# Patient Record
Sex: Male | Born: 1950 | Race: White | Hispanic: Yes | Marital: Single | Smoking: Never smoker
Health system: Southern US, Community
[De-identification: ages and names within clinical notes are randomized; demographics above are authoritative.]

## PROBLEM LIST (undated history)

## (undated) DIAGNOSIS — L821 Other seborrheic keratosis: Secondary | ICD-10-CM

## (undated) DIAGNOSIS — I10 Essential (primary) hypertension: Secondary | ICD-10-CM

## (undated) DIAGNOSIS — R600 Localized edema: Secondary | ICD-10-CM

## (undated) DIAGNOSIS — R3915 Urgency of urination: Secondary | ICD-10-CM

## (undated) DIAGNOSIS — R319 Hematuria, unspecified: Secondary | ICD-10-CM

## (undated) DIAGNOSIS — N201 Calculus of ureter: Secondary | ICD-10-CM

## (undated) DIAGNOSIS — Z87442 Personal history of urinary calculi: Secondary | ICD-10-CM

## (undated) DIAGNOSIS — I639 Cerebral infarction, unspecified: Secondary | ICD-10-CM

## (undated) DIAGNOSIS — E785 Hyperlipidemia, unspecified: Secondary | ICD-10-CM

## (undated) DIAGNOSIS — R350 Frequency of micturition: Secondary | ICD-10-CM

## (undated) DIAGNOSIS — N189 Chronic kidney disease, unspecified: Secondary | ICD-10-CM

## (undated) DIAGNOSIS — J302 Other seasonal allergic rhinitis: Secondary | ICD-10-CM

## (undated) DIAGNOSIS — R1310 Dysphagia, unspecified: Secondary | ICD-10-CM

## (undated) DIAGNOSIS — E119 Type 2 diabetes mellitus without complications: Secondary | ICD-10-CM

## (undated) DIAGNOSIS — E1169 Type 2 diabetes mellitus with other specified complication: Secondary | ICD-10-CM

## (undated) HISTORY — DX: Essential (primary) hypertension: I10

## (undated) HISTORY — DX: Hyperlipidemia, unspecified: E78.5

## (undated) HISTORY — DX: Type 2 diabetes mellitus with other specified complication: E11.69

## (undated) HISTORY — PX: CIRCUMCISION: SUR203

## (undated) HISTORY — DX: Dysphagia, unspecified: R13.10

---
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
## 2004-09-13 ENCOUNTER — Emergency Department (HOSPITAL_COMMUNITY): Admission: EM | Admit: 2004-09-13 | Discharge: 2004-09-14 | Payer: Self-pay | Admitting: Emergency Medicine

## 2006-11-27 ENCOUNTER — Emergency Department (HOSPITAL_COMMUNITY): Admission: EM | Admit: 2006-11-27 | Discharge: 2006-11-28 | Payer: Self-pay | Admitting: Emergency Medicine

## 2008-06-27 DIAGNOSIS — I2510 Atherosclerotic heart disease of native coronary artery without angina pectoris: Secondary | ICD-10-CM | POA: Insufficient documentation

## 2012-11-09 ENCOUNTER — Ambulatory Visit (INDEPENDENT_AMBULATORY_CARE_PROVIDER_SITE_OTHER): Payer: 59 | Admitting: Sports Medicine

## 2012-11-09 VITALS — BP 152/81 | Ht 68.0 in | Wt 187.0 lb

## 2012-11-09 DIAGNOSIS — M545 Low back pain, unspecified: Secondary | ICD-10-CM

## 2012-11-09 MED ORDER — PREDNISONE 10 MG PO KIT: PACK | ORAL | Status: DC

## 2012-11-09 NOTE — Progress Notes (Signed)
  Subjective:    Patient ID: Daniel Holland, male    DOB: 1951/02/14, 62 y.o.   MRN: [id number]  HPI chief complaint: Back pain  62 year old male comes in today with 2 days of low back pain. He had a sudden onset of pain that began after he was carrying a couple of cases of Gatorade. Sharp pain that is mainly on the right side of his low-back but pain initially radiated up his entire spine. He denies radiating pain into his legs. No associated numbness or tingling. He has a history of a lumbar disc herniation which was treated conservatively a couple of years ago. He has not had any problems with his back since. He works at [employer] and earlier today was able to visit the athletic training room where he got some stim treatment. His pain resolves with lying supine but he has immediate pain and spasm with trying to roll over or sit up. Once upright, he is able to walk but is walking with a limp which causes the spasm to worsen. No change in bowel bladder habits. He was treated with a steroid dose pack 2 years ago as well as oxycodone but the oxycodone caused stomach upset. He denies previous lumbar spine surgery. Denies groin pain.  Past medical history and current medications are reviewed. He takes no chronic medications No known drug allergies Does not smoke, denies alcohol use, and works as a Technical sales engineer at [employer]    Review of Systems     Objective:   Physical Exam Well-developed, he is in some mild distress due to his pain. He is most comfortable lying supine on the exam table. Awake alert and oriented x3. Vital signs are reviewed. Lumbar spine: Limited motion in all planes secondary to pain. There is spasm along the right paraspinal musculature. No tenderness to palpation or percussion along the lumbar midline. Neurological exam: Negative straight leg raise bilaterally. Reflexes are equal at the Achilles and patellar tendons bilaterally. No focal strength  deficits.       Assessment & Plan:  1. Low back pain likely secondary to lumbar strain 2. History of lumbar disc herniation  6 day Sterapred Dosepak to take as directed. Continue with treatments including stim in the training room at [employer]. I recommended moist heat as well. I will get the records from [facility] as well as [facility]. Phone followup in 48 hours for check on progress.

## 2013-03-01 ENCOUNTER — Ambulatory Visit (HOSPITAL_COMMUNITY)
Admission: AD | Admit: 2013-03-01 | Discharge: 2013-03-01 | Disposition: A | Discharge status: Home / Self Care | Payer: 59 | Source: Ambulatory Visit | Attending: Urology | Admitting: Urology

## 2013-03-01 ENCOUNTER — Ambulatory Visit (HOSPITAL_COMMUNITY): Payer: 59 | Admitting: Anesthesiology

## 2013-03-01 ENCOUNTER — Encounter (HOSPITAL_COMMUNITY): Payer: Self-pay | Admitting: Emergency Medicine

## 2013-03-01 ENCOUNTER — Inpatient Hospital Stay (HOSPITAL_COMMUNITY): Payer: 59

## 2013-03-01 ENCOUNTER — Encounter (HOSPITAL_COMMUNITY): Payer: Self-pay | Admitting: *Deleted

## 2013-03-01 ENCOUNTER — Other Ambulatory Visit: Payer: Self-pay | Admitting: Urology

## 2013-03-01 ENCOUNTER — Emergency Department (HOSPITAL_COMMUNITY): Payer: 59

## 2013-03-01 ENCOUNTER — Encounter (HOSPITAL_COMMUNITY)
Admission: AD | Disposition: A | Discharge status: Home / Self Care | Payer: Self-pay | Source: Ambulatory Visit | Attending: Urology

## 2013-03-01 ENCOUNTER — Emergency Department (HOSPITAL_COMMUNITY)
Admission: EM | Admit: 2013-03-01 | Discharge: 2013-03-01 | Disposition: A | Discharge status: Home / Self Care | Payer: 59 | Attending: Emergency Medicine | Admitting: Emergency Medicine

## 2013-03-01 ENCOUNTER — Encounter (HOSPITAL_COMMUNITY): Payer: Self-pay | Admitting: Anesthesiology

## 2013-03-01 DIAGNOSIS — N1330 Unspecified hydronephrosis: Secondary | ICD-10-CM | POA: Insufficient documentation

## 2013-03-01 DIAGNOSIS — R112 Nausea with vomiting, unspecified: Secondary | ICD-10-CM | POA: Insufficient documentation

## 2013-03-01 DIAGNOSIS — K5730 Diverticulosis of large intestine without perforation or abscess without bleeding: Secondary | ICD-10-CM | POA: Insufficient documentation

## 2013-03-01 DIAGNOSIS — N200 Calculus of kidney: Secondary | ICD-10-CM | POA: Insufficient documentation

## 2013-03-01 DIAGNOSIS — N201 Calculus of ureter: Secondary | ICD-10-CM | POA: Insufficient documentation

## 2013-03-01 DIAGNOSIS — Q549 Hypospadias, unspecified: Secondary | ICD-10-CM | POA: Insufficient documentation

## 2013-03-01 HISTORY — DX: Essential (primary) hypertension: I10

## 2013-03-01 HISTORY — DX: Other seasonal allergic rhinitis: J30.2

## 2013-03-01 HISTORY — PX: CYSTOSCOPY/RETROGRADE/URETEROSCOPY: SHX5316

## 2013-03-01 LAB — URINALYSIS, ROUTINE W REFLEX MICROSCOPIC
Glucose, UA: NEGATIVE mg/dL
Ketones, ur: NEGATIVE mg/dL
Nitrite: NEGATIVE
Protein, ur: NEGATIVE mg/dL
Specific Gravity, Urine: 1.028 (ref 1.005–1.030)

## 2013-03-01 LAB — URINE MICROSCOPIC-ADD ON

## 2013-03-01 SURGERY — CYSTOSCOPY/RETROGRADE/URETEROSCOPY
Anesthesia: General | Site: Ureter | Laterality: Right | Wound class: Clean Contaminated

## 2013-03-01 MED ORDER — TAMSULOSIN HCL 0.4 MG PO CAPS: 0.4000 mg | ORAL_CAPSULE | Freq: Every day | ORAL | Status: DC

## 2013-03-01 MED ORDER — OXYBUTYNIN CHLORIDE 5 MG PO TABS: 5.0000 mg | ORAL_TABLET | Freq: Four times a day (QID) | ORAL | Status: DC | PRN

## 2013-03-01 MED ORDER — HYDROCODONE-ACETAMINOPHEN 5-325 MG PO TABS: 1.0000 | ORAL_TABLET | ORAL | Status: DC | PRN

## 2013-03-01 MED ORDER — LACTATED RINGERS IV SOLN: INTRAVENOUS | Status: DC

## 2013-03-01 MED ORDER — ONDANSETRON HCL 4 MG/2ML IJ SOLN
4.0000 mg | INTRAMUSCULAR | Status: DC | PRN
  Administered 2013-03-01: 4 mg via INTRAVENOUS
  Filled 2013-03-01: qty 2

## 2013-03-01 MED ORDER — FENTANYL CITRATE 0.05 MG/ML IJ SOLN: 25.0000 ug | INTRAMUSCULAR | Status: DC | PRN

## 2013-03-01 MED ORDER — HYOSCYAMINE SULFATE 0.125 MG PO TABS: 0.1250 mg | ORAL_TABLET | ORAL | Status: DC | PRN

## 2013-03-01 MED ORDER — MORPHINE SULFATE 10 MG/ML IJ SOLN
2.0000 mg | INTRAMUSCULAR | Status: DC | PRN
  Administered 2013-03-01: 2 mg via INTRAVENOUS
  Filled 2013-03-01: qty 1

## 2013-03-01 MED ORDER — PHENAZOPYRIDINE HCL 200 MG PO TABS: 200.0000 mg | ORAL_TABLET | Freq: Three times a day (TID) | ORAL | Status: DC | PRN

## 2013-03-01 MED ORDER — SENNOSIDES-DOCUSATE SODIUM 8.6-50 MG PO TABS: 1.0000 | ORAL_TABLET | Freq: Two times a day (BID) | ORAL | Status: DC

## 2013-03-01 MED ORDER — BELLADONNA ALKALOIDS-OPIUM 16.2-60 MG RE SUPP
RECTAL | Status: AC
  Filled 2013-03-01: qty 1

## 2013-03-01 MED ORDER — ONDANSETRON 8 MG PO TBDP: ORAL_TABLET | ORAL | Status: DC

## 2013-03-01 MED ORDER — LIDOCAINE HCL 2 % EX GEL
CUTANEOUS | Status: DC | PRN
  Administered 2013-03-01: 1 via URETHRAL

## 2013-03-01 MED ORDER — CIPROFLOXACIN HCL 500 MG PO TABS: 500.0000 mg | ORAL_TABLET | Freq: Two times a day (BID) | ORAL | Status: DC

## 2013-03-01 MED ORDER — CEFAZOLIN SODIUM-DEXTROSE 2-3 GM-% IV SOLR: 2.0000 g | INTRAVENOUS | Status: DC

## 2013-03-01 MED ORDER — DEXAMETHASONE SODIUM PHOSPHATE 10 MG/ML IJ SOLN
INTRAMUSCULAR | Status: DC | PRN
  Administered 2013-03-01: 10 mg via INTRAVENOUS

## 2013-03-01 MED ORDER — IOHEXOL 300 MG/ML  SOLN
INTRAMUSCULAR | Status: DC | PRN
  Administered 2013-03-01: 10 mL via INTRAVENOUS

## 2013-03-01 MED ORDER — MIDAZOLAM HCL 5 MG/5ML IJ SOLN
INTRAMUSCULAR | Status: DC | PRN
  Administered 2013-03-01: 2 mg via INTRAVENOUS

## 2013-03-01 MED ORDER — CEFAZOLIN SODIUM-DEXTROSE 2-3 GM-% IV SOLR
2.0000 g | Freq: Three times a day (TID) | INTRAVENOUS | Status: DC
  Administered 2013-03-01: 2 g via INTRAVENOUS
  Filled 2013-03-01 (×2): qty 50

## 2013-03-01 MED ORDER — SODIUM CHLORIDE 0.9 % IV SOLN
INTRAVENOUS | Status: DC
  Administered 2013-03-01 (×2): via INTRAVENOUS

## 2013-03-01 MED ORDER — OXYCODONE-ACETAMINOPHEN 5-325 MG PO TABS: 2.0000 | ORAL_TABLET | ORAL | Status: DC | PRN

## 2013-03-01 MED ORDER — PROPOFOL 10 MG/ML IV BOLUS
INTRAVENOUS | Status: DC | PRN
  Administered 2013-03-01: 200 mg via INTRAVENOUS

## 2013-03-01 MED ORDER — FENTANYL CITRATE 0.05 MG/ML IJ SOLN
INTRAMUSCULAR | Status: DC | PRN
  Administered 2013-03-01 (×2): 50 ug via INTRAVENOUS

## 2013-03-01 MED ORDER — LIDOCAINE HCL 2 % EX GEL
CUTANEOUS | Status: AC
  Filled 2013-03-01: qty 10

## 2013-03-01 MED ORDER — CEFAZOLIN SODIUM 1-5 GM-% IV SOLN
INTRAVENOUS | Status: AC
  Filled 2013-03-01: qty 100

## 2013-03-01 MED ORDER — BELLADONNA ALKALOIDS-OPIUM 16.2-60 MG RE SUPP
RECTAL | Status: DC | PRN
  Administered 2013-03-01: 1 via RECTAL

## 2013-03-01 SURGICAL SUPPLY — 16 items
BAG URO CATCHER STRL LF (DRAPE) ×2 IMPLANT
BASKET ZERO TIP NITINOL 2.4FR (BASKET) IMPLANT
BSKT STON RTRVL ZERO TP 2.4FR (BASKET)
CATH INTERMIT  6FR 70CM (CATHETERS) ×2 IMPLANT
CLOTH BEACON ORANGE TIMEOUT ST (SAFETY) ×2 IMPLANT
DRAPE CAMERA CLOSED 9X96 (DRAPES) ×2 IMPLANT
GLOVE BIOGEL M 7.0 STRL (GLOVE) ×2 IMPLANT
GOWN PREVENTION PLUS LG XLONG (DISPOSABLE) ×2 IMPLANT
GOWN PREVENTION PLUS XLARGE (GOWN DISPOSABLE) ×4 IMPLANT
GUIDEWIRE ANG ZIPWIRE 038X150 (WIRE) IMPLANT
GUIDEWIRE STR DUAL SENSOR (WIRE) ×2 IMPLANT
MANIFOLD NEPTUNE II (INSTRUMENTS) ×2 IMPLANT
PACK CYSTO (CUSTOM PROCEDURE TRAY) ×2 IMPLANT
SCRUB PCMX 4 OZ (MISCELLANEOUS) ×2 IMPLANT
STENT CONTOUR 6FRX26X.038 (STENTS) ×2 IMPLANT
TUBING CONNECTING 10 (TUBING) ×2 IMPLANT

## 2013-03-01 NOTE — Preoperative (Signed)
Beta Blockers   Reason not to administer Beta Blockers:Not Applicable 

## 2013-03-01 NOTE — ED Notes (Signed)
Pt denies any pain at this time.

## 2013-03-01 NOTE — ED Notes (Signed)
Pt sitting on the bed in no acute distress.  Per pt pain in right flank started two days ago, he has been able to control it with 800 mg of ibuprofen.  No pain at present. Intermittent nausea.

## 2013-03-01 NOTE — Progress Notes (Signed)
Report called to Newman Nip RN for bed [phone]. Pt taken to floor for 10 PM surgery.

## 2013-03-01 NOTE — Anesthesia Postprocedure Evaluation (Signed)
  Anesthesia Post-op Note  Patient: Daniel Holland  Procedure(s) Performed: Procedure(s) (LRB): CYSTOSCOPY/RETROGRADE/URETEROSCOPY (Right)  Patient Location: PACU  Anesthesia Type: General  Level of Consciousness: awake and alert   Airway and Oxygen Therapy: Patient Spontanous Breathing  Post-op Pain: mild  Post-op Assessment: Post-op Vital signs reviewed, Patient's Cardiovascular Status Stable, Respiratory Function Stable, Patent Airway and No signs of Nausea or vomiting  Last Vitals:  Filed Vitals:   03/01/13 2245  BP:   Pulse: 94  Temp:   Resp: 17    Post-op Vital Signs: stable   Complications: No apparent anesthesia complications

## 2013-03-01 NOTE — ED Provider Notes (Signed)
CSN: [id number]     Arrival date & time 03/01/13  4098 History   First MD Initiated Contact with Patient 03/01/13 0527     No chief complaint on file.  (Consider location/radiation/quality/duration/timing/severity/associated sxs/prior Treatment) HPI Comments: Patient complains of right flank pain. He describes a two-day history of pain in his right mid back area. Today it started radiating around to his right midabdomen. He denies urinary symptoms or hematuria. He describes as a sharp crampy pain that is intermittent in intensity. He's had some nausea but no vomiting associated with it. He does have a history of kidney stones about 6 or 7 years ago which he passed without surgical intervention. He states his pain is similar to his episodes of renal colic in the past.   Past Medical History  Diagnosis Date  . Kidney stone on right side 2007   History reviewed. No pertinent past surgical history. History reviewed. No pertinent family history. History  Substance Use Topics  . Smoking status: Never Smoker   . Smokeless tobacco: Not on file  . Alcohol Use: No    Review of Systems  Constitutional: Negative for fever, chills, diaphoresis and fatigue.  HENT: Negative for congestion, rhinorrhea and sneezing.   Eyes: Negative.   Respiratory: Negative for cough, chest tightness and shortness of breath.   Cardiovascular: Negative for chest pain and leg swelling.  Gastrointestinal: Positive for nausea and abdominal pain. Negative for vomiting, diarrhea and blood in stool.  Genitourinary: Positive for flank pain. Negative for frequency, hematuria and difficulty urinating.  Musculoskeletal: Positive for back pain. Negative for arthralgias.  Skin: Negative for rash.  Neurological: Negative for dizziness, speech difficulty, weakness, numbness and headaches.    Allergies  Review of patient's allergies indicates no known allergies.  Home Medications   Current Outpatient Rx  Name  Route  Sig   Dispense  Refill  . ibuprofen (ADVIL,MOTRIN) 200 MG tablet   Oral   Take 600 mg by mouth every 8 (eight) hours as needed for pain.         Marland Kitchen ondansetron (ZOFRAN ODT) 8 MG disintegrating tablet      8mg  ODT q4 hours prn nausea   10 tablet   0   . oxyCODONE-acetaminophen (PERCOCET) 5-325 MG per tablet   Oral   Take 2 tablets by mouth every 4 (four) hours as needed for pain.   20 tablet   0    BP 176/103  Pulse 64  Temp(Src) 98.2 F (36.8 C) (Oral)  Resp 16  Ht 5\' 9"  (1.753 m)  Wt 184 lb (83.462 kg)  BMI 27.16 kg/m2  SpO2 97% Physical Exam  Constitutional: He is oriented to person, place, and time. He appears well-developed and well-nourished.  HENT:  Head: Normocephalic and atraumatic.  Eyes: Pupils are equal, round, and reactive to light.  Neck: Normal range of motion. Neck supple.  Cardiovascular: Normal rate, regular rhythm and normal heart sounds.   Pulmonary/Chest: Effort normal and breath sounds normal. No respiratory distress. He has no wheezes. He has no rales. He exhibits no tenderness.  Abdominal: Soft. Bowel sounds are normal. There is no tenderness. There is no rebound and no guarding.  Musculoskeletal: Normal range of motion. He exhibits no edema.  Lymphadenopathy:    He has no cervical adenopathy.  Neurological: He is alert and oriented to person, place, and time.  Skin: Skin is warm and dry. No rash noted.  Psychiatric: He has a normal mood and affect.    ED  Course  Procedures (including critical care time) Labs Review Labs Reviewed  URINALYSIS, ROUTINE W REFLEX MICROSCOPIC - Abnormal; Notable for the following:    Hgb urine dipstick LARGE (*)    All other components within normal limits  URINE MICROSCOPIC-ADD ON - Abnormal; Notable for the following:    Bacteria, UA FEW (*)    All other components within normal limits   Imaging Review Ct Abdomen Pelvis Wo Contrast  03/01/2013   *RADIOLOGY REPORT*  Clinical Data: Right flank pain.  CT ABDOMEN  AND PELVIS WITHOUT CONTRAST  Technique:  Multidetector CT imaging of the abdomen and pelvis was performed following the standard protocol without intravenous contrast.  Comparison: CT of the abdomen and pelvis performed 09/13/2004  Findings: The visualized lung bases are clear.  The liver and spleen are unremarkable in appearance.  The gallbladder is within normal limits.  The pancreas and adrenal glands are unremarkable.  There is mild right-sided hydronephrosis, with prominence of the right ureter to the level of an obstructing 7 x 6 mm stone within the proximal right ureter, approximately 4 cm below the right ureteropelvic junction.  Associated right-sided perinephric stranding is seen, somewhat more prominent than on the left.  A nonobstructing 5 mm stone is noted at the interpole region of the right kidney, and a nonobstructing 6 mm stone is noted near the lower pole of the left kidney.  There is also a nonobstructing 6 mm stone at the upper pole of the left kidney.  No free fluid is identified.  The small bowel is unremarkable in appearance.  The stomach is within normal limits.  No acute vascular abnormalities are seen.  The appendix is normal in caliber and contains air, without evidence for appendicitis.  Scattered diverticulosis is noted along the sigmoid colon, without evidence of diverticulitis.  The bladder is decompressed and not well assessed.  The prostate remains normal in size.  No inguinal lymphadenopathy is seen.  No acute osseous abnormalities are identified.  Vacuum phenomenon is noted at L5-S1.  IMPRESSION:  1.  Mild right-sided hydronephrosis, with an obstructing 7 x 6 mm stone in the proximal right ureter, 4 cm below the right ureteropelvic junction. 2.  Nonobstructing bilateral renal stones seen, measuring up to 6 mm in size. 3.  Scattered diverticulosis along the sigmoid colon, without evidence of diverticulitis.   Original Report Authenticated By: Tonia Ghent, M.D.    MDM   1.  Kidney stone    Patient a large proximal ureteral stone. He has no evidence of urinary infection. He was discharged home in good condition. He actually did not require any pain medication in the ED. I advised him that he will need close followup with urologist as this kidney stone is likely not to pass on its own. He was discharged with the urine strainer. I advised him to call today for an appointment with [facility]. Advised to return here if his pain is in control with pain medication, he has ongoing vomiting, or fevers.    Rolan Bucco, MD 03/01/13 [phone]

## 2013-03-01 NOTE — Transfer of Care (Signed)
Immediate Anesthesia Transfer of Care Note  Patient: Daniel Holland  Procedure(s) Performed: Procedure(s) (LRB): CYSTOSCOPY/RETROGRADE/URETEROSCOPY (Right)  Patient Location: PACU  Anesthesia Type: General  Level of Consciousness: sedated, patient cooperative and responds to stimulation  Airway & Oxygen Therapy: Patient Spontanous Breathing and Patient connected to face mask oxgen  Post-op Assessment: Report given to PACU RN and Post -op Vital signs reviewed and stable  Post vital signs: Reviewed and stable  Complications: No apparent anesthesia complications

## 2013-03-01 NOTE — Anesthesia Preprocedure Evaluation (Addendum)
Anesthesia Evaluation  Patient identified by MRN, date of birth, ID band Patient awake    Reviewed: Allergy & Precautions, H&P , NPO status , Patient's Chart, lab work & pertinent test results  Airway Mallampati: II TM Distance: >3 FB Neck ROM: full    Dental no notable dental hx.    Pulmonary neg pulmonary ROS,  breath sounds clear to auscultation  Pulmonary exam normal       Cardiovascular Exercise Tolerance: Good hypertension, Rhythm:regular Rate:Normal     Neuro/Psych negative neurological ROS  negative psych ROS   GI/Hepatic negative GI ROS, Neg liver ROS,   Endo/Other  negative endocrine ROS  Renal/GU negative Renal ROS  negative genitourinary   Musculoskeletal   Abdominal   Peds  Hematology negative hematology ROS (+)   Anesthesia Other Findings   Reproductive/Obstetrics negative OB ROS                           Anesthesia Physical Anesthesia Plan  ASA: II  Anesthesia Plan: General   Post-op Pain Management:    Induction: Intravenous  Airway Management Planned: LMA  Additional Equipment:   Intra-op Plan:   Post-operative Plan:   Informed Consent: I have reviewed the patients History and Physical, chart, labs and discussed the procedure including the risks, benefits and alternatives for the proposed anesthesia with the patient or authorized representative who has indicated his/her understanding and acceptance.   Dental Advisory Given  Plan Discussed with: CRNA and Surgeon  Anesthesia Plan Comments:       Anesthesia Quick Evaluation

## 2013-03-01 NOTE — Op Note (Signed)
Urology Operative Report  Date of Procedure: 03/01/13  Surgeon: Natalia Leatherwood, MD Assistant:  None  Preoperative Diagnosis: Right ureter stone Postoperative Diagnosis:  Same  Procedure(s): Cystoscopy Right retrograde pyelogram with interpretation Right ureter stent placement (6 x 26)  Estimated blood loss: None  Specimen: None  Drains: None  Complications: None  Findings: Coronal hypospadias. Stone visible on fluoroscopy. Renal pelvic stone visible on retrograde pyelogram.  History of present illness: 62 year old male presents today for cystoscopy and right ureter stent placement to to a right proximal ureter stone seen on CT scan.   Procedure in detail: After informed consent was obtained, the patient was taken to the operating room. They were placed in the supine position. SCDs were turned on and in place. IV antibiotics were infused, and general anesthesia was induced. A timeout was performed in which the correct patient, surgical site, and procedure were identified and agreed upon by the team.  The patient was placed in a dorsolithotomy position, making sure to pad all pertinent neurovascular pressure points. The genitals were prepped and draped in the usual sterile fashion.  A rigid cystoscope was advanced through the urethra and into the bladder. The bladder was drained and then fully distended and evaluated in a systematic fashion to visualize the entire surface of the bladder. This was negative for tumors. Attention was turned to the right ureter orifice. I performed fluoroscopy but could not visualize the stone in his proximal ureter. It was cannulated with a 5 [place] ureter catheter and I injected contrast to obtain a retrograde pyelogram. There was a filling defect seen in the right renal pelvis at the ureteropelvic junction which likely represents the proximal ureter stone. There were no other filling defects along the course of the ureter.  I then removed the  ureter catheter and placed a sensor tip wire into the right renal pelvis on fluoroscopy. A 6 x 26 double-J ureter stent was then placed with ease over the wire, and I removed the tethering string. This was deployed with good curl in the right renal pelvis and a curl in the bladder. There was efflux of urine through the stent. The bladder was drained and the cystoscope was removed.  This completed the procedure. I placed 10 cc of lidocaine jelly into the urethra and a belladonna and opium suppository into the rectum. Anesthesia was reversed and he was taken to the PACU in stable condition.  All counts were correct at the end of the case.

## 2013-03-01 NOTE — H&P (Signed)
Urology History and Physical Exam  CC: Right ureter stone.  HPI:  62 year old male presents today for nephrolithiasis.  He has a history of kidney stones.  He was able to pass them previously.  This is associated with nausea, emesis, and flank pain.  It is not associated with fever.  It is improved with Percocet, but this causes further nausea.  No culture was performed today when he was seen in the ER.  He had a CT scan which revealed a right proximal ureter stone.  This is approximately 0.7 cm in size.  It was 4 cm below the right ureteral pelvic junction.  It was associated with right-sided hydronephrosis.  We discussed reasons for seeking intervention such as fever, infection, intractable pain, and intractable nausea.  I have recommended placement of a ureter stent today with interval treatment with either ureteroscopy or shockwave lithotripsy.  Shockwave lithotripsy would be less invasive, but ureteroscopy would allow [place] to treat the right kidney stone as well.  ER 03/01/13 UA: negative LE/nitrite, large blood. No culture done.  CT 03/01/13 Right proximal ureter, 0.7 x 0.6 cm, HU 463, right hydronephrosis, 4 cm below right UPJ.  RMP: 0.5 cm (non-obstructing) LLP: 0.6 cm  (non-obstructing) LUP: 0.6 cm (non-obstructing)  Today we discussed the management of urinary stones. These options include observation, ureteroscopy, shockwave lithotripsy, and PCNL. We discussed which options are relevant to these particular stones. We discussed the natural history of stones as well as the complications of untreated stones and the impact on quality of life without treatment as well as with each of the above listed treatments. We also discussed the efficacy of each treatment in its ability to clear the stone burden. With any of these management options I discussed the signs and symptoms of infection and the need for emergent treatment should these be experienced. For each option we discussed the ability of  each procedure to clear the patient of their stone burden. We also discussed risks, benefits, alternatives, and likelihood of achieving goals.  I recommended placement of a right ureter stent with cystoscopy and possible right retrograde program today.  We discussed the risks, benefits, alternatives, and likelihood of achieving goals.  He wishes to proceed today with interval treatment in the future.  PMH: Past Medical History  Diagnosis Date  . Kidney stone on right side 2007    PSH: No past surgical history on file.  Allergies: No Known Allergies  Medications: No prescriptions prior to admission     Social History: History   Social History  . Marital Status: Divorced    Spouse Name: N/A    Number of Children: N/A  . Years of Education: N/A   Occupational History  . Not on file.   Social History Main Topics  . Smoking status: Never Smoker   . Smokeless tobacco: Not on file  . Alcohol Use: No  . Drug Use: No  . Sexual Activity: Not on file   Other Topics Concern  . Not on file   Social History Narrative  . No narrative on file    Family History: No family history on file.  Review of Systems: Positive: Nausea, emesis, flank pain. Negative: Fever, chest pain, or SOB.  A further 10 point review of systems was negative except what is listed in the HPI.  Physical Exam: General: No acute distress.  Awake. Head:  Normocephalic.  Atraumatic. ENT:  EOMI.  Mucous membranes moist Neck:  Supple.  No lymphadenopathy. CV:  S1 present.  S2 present. Regular rate. Pulmonary: Equal effort bilaterally.  Clear to auscultation bilaterally. Abdomen: Soft.  Non- tender to palpation. Skin:  Normal turgor.  No visible rash. Extremity: No gross deformity of bilateral upper extremities.  No gross deformity of    bilateral lower extremities. Neurologic: Alert. Appropriate mood.    Studies:  No results found for this basename: HGB, WBC, PLT,  in the last 72 hours  No results  found for this basename: NA, K, CL, CO2, BUN, CREATININE, CALCIUM, MAGNESIUM, GFRNONAA, GFRAA,  in the last 72 hours   No results found for this basename: PT, INR, APTT,  in the last 72 hours   No components found with this basename: ABG,     Assessment:  Right ureter stone.  Plan: To OR for right ureter stent with cystoscopy and possible right retrograde program.

## 2013-03-01 NOTE — ED Notes (Signed)
Per pt: started having back pain Friday night. Sunday night the pain got worse. Tried using heat wrap and taking ibuprofen with no relieve. Has passed a kidney stone twice in the past and states the pain feels the same.

## 2013-03-02 ENCOUNTER — Encounter (HOSPITAL_COMMUNITY): Payer: Self-pay | Admitting: Urology

## 2013-03-02 ENCOUNTER — Other Ambulatory Visit: Payer: Self-pay | Admitting: Urology

## 2013-03-04 ENCOUNTER — Encounter (HOSPITAL_BASED_OUTPATIENT_CLINIC_OR_DEPARTMENT_OTHER): Payer: Self-pay | Admitting: *Deleted

## 2013-03-04 NOTE — Progress Notes (Signed)
NPO AFTER MN. ARRIVE AT 0700. NEEDS ISTAT. CURRENT EKG IN EPIC AND CHART. MAY TAKE PRN MEDS IF NEEDED W/ SIPS OF WATER AM DOS.

## 2013-03-10 ENCOUNTER — Ambulatory Visit (HOSPITAL_COMMUNITY): Payer: 59

## 2013-03-10 ENCOUNTER — Ambulatory Visit (HOSPITAL_BASED_OUTPATIENT_CLINIC_OR_DEPARTMENT_OTHER)
Admission: RE | Admit: 2013-03-10 | Discharge: 2013-03-10 | Disposition: A | Discharge status: Home / Self Care | Payer: 59 | Source: Ambulatory Visit | Attending: Urology | Admitting: Urology

## 2013-03-10 ENCOUNTER — Encounter (HOSPITAL_BASED_OUTPATIENT_CLINIC_OR_DEPARTMENT_OTHER): Payer: Self-pay | Admitting: *Deleted

## 2013-03-10 ENCOUNTER — Encounter (HOSPITAL_BASED_OUTPATIENT_CLINIC_OR_DEPARTMENT_OTHER): Payer: 59 | Admitting: Anesthesiology

## 2013-03-10 ENCOUNTER — Ambulatory Visit (HOSPITAL_BASED_OUTPATIENT_CLINIC_OR_DEPARTMENT_OTHER): Payer: 59 | Admitting: Anesthesiology

## 2013-03-10 ENCOUNTER — Encounter (HOSPITAL_BASED_OUTPATIENT_CLINIC_OR_DEPARTMENT_OTHER)
Admission: RE | Disposition: A | Discharge status: Home / Self Care | Payer: Self-pay | Source: Ambulatory Visit | Attending: Urology

## 2013-03-10 DIAGNOSIS — I10 Essential (primary) hypertension: Secondary | ICD-10-CM | POA: Insufficient documentation

## 2013-03-10 DIAGNOSIS — R109 Unspecified abdominal pain: Secondary | ICD-10-CM

## 2013-03-10 DIAGNOSIS — N200 Calculus of kidney: Secondary | ICD-10-CM | POA: Insufficient documentation

## 2013-03-10 DIAGNOSIS — N201 Calculus of ureter: Secondary | ICD-10-CM

## 2013-03-10 DIAGNOSIS — Z79899 Other long term (current) drug therapy: Secondary | ICD-10-CM | POA: Insufficient documentation

## 2013-03-10 HISTORY — DX: Hematuria, unspecified: R31.9

## 2013-03-10 HISTORY — PX: HOLMIUM LASER APPLICATION: SHX5852

## 2013-03-10 HISTORY — PX: CYSTOSCOPY WITH RETROGRADE PYELOGRAM, URETEROSCOPY AND STENT PLACEMENT: SHX5789

## 2013-03-10 HISTORY — DX: Frequency of micturition: R35.0

## 2013-03-10 HISTORY — DX: Calculus of ureter: N20.1

## 2013-03-10 HISTORY — DX: Urgency of urination: R39.15

## 2013-03-10 HISTORY — DX: Personal history of urinary calculi: Z87.442

## 2013-03-10 LAB — POCT I-STAT 4, (NA,K, GLUC, HGB,HCT)
HCT: 44 % (ref 39.0–52.0)
Hemoglobin: 15 g/dL (ref 13.0–17.0)
Potassium: 4.3 mEq/L (ref 3.5–5.1)
Sodium: 142 mEq/L (ref 135–145)

## 2013-03-10 SURGERY — CYSTOURETEROSCOPY, WITH RETROGRADE PYELOGRAM AND STENT INSERTION
Anesthesia: General | Site: Ureter | Laterality: Right | Wound class: Clean Contaminated

## 2013-03-10 MED ORDER — PROPOFOL 10 MG/ML IV BOLUS
INTRAVENOUS | Status: DC | PRN
  Administered 2013-03-10: 50 mg via INTRAVENOUS
  Administered 2013-03-10: 200 mg via INTRAVENOUS
  Administered 2013-03-10: 150 mg via INTRAVENOUS

## 2013-03-10 MED ORDER — BELLADONNA ALKALOIDS-OPIUM 16.2-60 MG RE SUPP
RECTAL | Status: DC | PRN
  Administered 2013-03-10: 1 via RECTAL

## 2013-03-10 MED ORDER — CEFAZOLIN SODIUM-DEXTROSE 2-3 GM-% IV SOLR
2.0000 g | INTRAVENOUS | Status: DC
  Filled 2013-03-10: qty 50

## 2013-03-10 MED ORDER — LACTATED RINGERS IV SOLN
INTRAVENOUS | Status: DC
  Administered 2013-03-10 (×2): via INTRAVENOUS
  Filled 2013-03-10: qty 1000

## 2013-03-10 MED ORDER — GLYCOPYRROLATE 0.2 MG/ML IJ SOLN
INTRAMUSCULAR | Status: DC | PRN
  Administered 2013-03-10: 0.2 mg via INTRAVENOUS

## 2013-03-10 MED ORDER — HYOSCYAMINE SULFATE 0.125 MG SL SUBL
0.1250 mg | SUBLINGUAL_TABLET | SUBLINGUAL | Status: DC | PRN
  Administered 2013-03-10: 0.125 mg via SUBLINGUAL
  Filled 2013-03-10: qty 1

## 2013-03-10 MED ORDER — LIDOCAINE HCL (CARDIAC) 20 MG/ML IV SOLN
INTRAVENOUS | Status: DC | PRN
  Administered 2013-03-10: 80 mg via INTRAVENOUS

## 2013-03-10 MED ORDER — MEPERIDINE HCL 25 MG/ML IJ SOLN
6.2500 mg | INTRAMUSCULAR | Status: DC | PRN
  Filled 2013-03-10: qty 1

## 2013-03-10 MED ORDER — PROMETHAZINE HCL 25 MG/ML IJ SOLN
6.2500 mg | INTRAMUSCULAR | Status: DC | PRN
  Filled 2013-03-10: qty 1

## 2013-03-10 MED ORDER — HYDROMORPHONE HCL PF 1 MG/ML IJ SOLN
0.2500 mg | INTRAMUSCULAR | Status: DC | PRN
  Filled 2013-03-10: qty 1

## 2013-03-10 MED ORDER — FENTANYL CITRATE 0.05 MG/ML IJ SOLN
INTRAMUSCULAR | Status: DC | PRN
  Administered 2013-03-10 (×3): 25 ug via INTRAVENOUS
  Administered 2013-03-10: 50 ug via INTRAVENOUS
  Administered 2013-03-10 (×7): 25 ug via INTRAVENOUS

## 2013-03-10 MED ORDER — LACTATED RINGERS IV SOLN
INTRAVENOUS | Status: DC | PRN
  Administered 2013-03-10 (×2): via INTRAVENOUS

## 2013-03-10 MED ORDER — DEXAMETHASONE SODIUM PHOSPHATE 4 MG/ML IJ SOLN
INTRAMUSCULAR | Status: DC | PRN
  Administered 2013-03-10: 10 mg via INTRAVENOUS

## 2013-03-10 MED ORDER — CEPHALEXIN 500 MG PO CAPS: 500.0000 mg | ORAL_CAPSULE | Freq: Three times a day (TID) | ORAL | Status: DC

## 2013-03-10 MED ORDER — ONDANSETRON HCL 4 MG/2ML IJ SOLN
INTRAMUSCULAR | Status: DC | PRN
  Administered 2013-03-10: 4 mg via INTRAMUSCULAR

## 2013-03-10 MED ORDER — KETOROLAC TROMETHAMINE 30 MG/ML IJ SOLN
INTRAMUSCULAR | Status: DC | PRN
  Administered 2013-03-10: 30 mg via INTRAVENOUS

## 2013-03-10 MED ORDER — ACETAMINOPHEN 10 MG/ML IV SOLN
INTRAVENOUS | Status: DC | PRN
  Administered 2013-03-10: 1000 mg via INTRAVENOUS

## 2013-03-10 MED ORDER — SODIUM CHLORIDE 0.9 % IR SOLN
Status: DC | PRN
  Administered 2013-03-10: 6000 mL

## 2013-03-10 MED ORDER — IOHEXOL 350 MG/ML SOLN
INTRAVENOUS | Status: DC | PRN
  Administered 2013-03-10: 16 mL via INTRAVENOUS

## 2013-03-10 MED ORDER — ALBUTEROL SULFATE HFA 108 (90 BASE) MCG/ACT IN AERS
INHALATION_SPRAY | RESPIRATORY_TRACT | Status: DC | PRN
  Administered 2013-03-10: 4 via RESPIRATORY_TRACT

## 2013-03-10 MED ORDER — MIDAZOLAM HCL 5 MG/5ML IJ SOLN
INTRAMUSCULAR | Status: DC | PRN
  Administered 2013-03-10: 2 mg via INTRAVENOUS

## 2013-03-10 MED ORDER — LIDOCAINE HCL 2 % EX GEL
CUTANEOUS | Status: DC | PRN
  Administered 2013-03-10: 1

## 2013-03-10 MED ORDER — EPHEDRINE SULFATE 50 MG/ML IJ SOLN
INTRAMUSCULAR | Status: DC | PRN
  Administered 2013-03-10 (×3): 10 mg via INTRAVENOUS

## 2013-03-10 SURGICAL SUPPLY — 44 items
ADAPTER CATH URET PLST 4-6FR (CATHETERS) IMPLANT
ADPR CATH URET STRL DISP 4-6FR (CATHETERS)
BAG DRAIN URO-CYSTO SKYTR STRL (DRAIN) ×2 IMPLANT
BAG DRN UROCATH (DRAIN) ×1
BASKET LASER NITINOL 1.9FR (BASKET) IMPLANT
BASKET STNLS GEMINI 4WIRE 3FR (BASKET) IMPLANT
BASKET ZERO TIP NITINOL 2.4FR (BASKET) ×2 IMPLANT
BRUSH URET BIOPSY 3F (UROLOGICAL SUPPLIES) IMPLANT
BSKT STON RTRVL 120 1.9FR (BASKET)
BSKT STON RTRVL GEM 120X11 3FR (BASKET)
BSKT STON RTRVL ZERO TP 2.4FR (BASKET) ×1
CANISTER SUCT LVC 12 LTR MEDI- (MISCELLANEOUS) ×2 IMPLANT
CATH CLEAR GEL 3F BACKSTOP (CATHETERS) IMPLANT
CATH INTERMIT  6FR 70CM (CATHETERS) IMPLANT
CATH URET 5FR 28IN CONE TIP (BALLOONS)
CATH URET 5FR 28IN OPEN ENDED (CATHETERS) ×2 IMPLANT
CATH URET 5FR 70CM CONE TIP (BALLOONS) IMPLANT
CATH URET DUAL LUMEN 6-10FR 50 (CATHETERS) IMPLANT
CLOTH BEACON ORANGE TIMEOUT ST (SAFETY) ×2 IMPLANT
DRAPE CAMERA CLOSED 9X96 (DRAPES) ×2 IMPLANT
ELECT REM PT RETURN 9FT ADLT (ELECTROSURGICAL)
ELECTRODE REM PT RTRN 9FT ADLT (ELECTROSURGICAL) IMPLANT
FIBER LASER FLEXIVA 200 (UROLOGICAL SUPPLIES) ×2 IMPLANT
GLOVE BIO SURGEON STRL SZ7 (GLOVE) ×2 IMPLANT
GLOVE ECLIPSE 7.0 STRL STRAW (GLOVE) ×2 IMPLANT
GLOVE INDICATOR 7.5 STRL GRN (GLOVE) ×2 IMPLANT
GOWN PREVENTION PLUS LG XLONG (DISPOSABLE) ×2 IMPLANT
GUIDEWIRE 0.038 PTFE COATED (WIRE) IMPLANT
GUIDEWIRE ANG ZIPWIRE 038X150 (WIRE) IMPLANT
GUIDEWIRE STR DUAL SENSOR (WIRE) ×4 IMPLANT
IV NS IRRIG 3000ML ARTHROMATIC (IV SOLUTION) ×4 IMPLANT
KIT BALLIN UROMAX 15FX10 (LABEL) IMPLANT
KIT BALLN UROMAX 15FX4 (MISCELLANEOUS) IMPLANT
KIT BALLN UROMAX 26 75X4 (MISCELLANEOUS)
LASER FIBER DISP (UROLOGICAL SUPPLIES) IMPLANT
PACK CYSTOSCOPY (CUSTOM PROCEDURE TRAY) ×2 IMPLANT
SET HIGH PRES BAL DIL (LABEL)
SHEATH ACCESS URETERAL 38CM (SHEATH) ×2 IMPLANT
SHEATH ACCESS URETERAL 54CM (SHEATH) IMPLANT
SHEATH URET ACCESS 12FR/35CM (UROLOGICAL SUPPLIES) IMPLANT
SHEATH URET ACCESS 12FR/55CM (UROLOGICAL SUPPLIES) IMPLANT
STENT POLARIS 6FRX26X.038 (STENTS) IMPLANT
STENT POLARIS LOOP 6FR X 26 CM (STENTS) ×2 IMPLANT
SYRINGE IRR TOOMEY STRL 70CC (SYRINGE) IMPLANT

## 2013-03-10 NOTE — Anesthesia Postprocedure Evaluation (Signed)
Anesthesia Post Note  Patient: Daniel Holland  Procedure(s) Performed: Procedure(s) (LRB): CYSTOSCOPY WITH RETROGRADE PYELOGRAM, URETEROSCOPY AND STENT PLACEMENT, left retrograde   " RIGHT STENT EXCHANGE AND POSSIBLE RIGHT RETROGRADE  PYELOGRAM" (Right) HOLMIUM LASER APPLICATION (Right)  Anesthesia type: General  Patient location: PACU  Post pain: Pain level controlled  Post assessment: Post-op Vital signs reviewed  Last Vitals: BP 145/84  Pulse 101  Temp(Src) 36.4 C (Oral)  Resp 16  Ht 5\' 9"  (1.753 m)  Wt 183 lb 8 oz (83.235 kg)  BMI 27.09 kg/m2  SpO2 100%  Post vital signs: Reviewed  Level of consciousness: sedated  Complications: No apparent anesthesia complications

## 2013-03-10 NOTE — Transfer of Care (Signed)
Immediate Anesthesia Transfer of Care Note  Patient: Hulen Sheriff  Procedure(s) Performed: Procedure(s) (LRB): CYSTOSCOPY WITH RETROGRADE PYELOGRAM, URETEROSCOPY AND STENT PLACEMENT, left retrograde   " RIGHT STENT EXCHANGE AND POSSIBLE RIGHT RETROGRADE  PYELOGRAM" (Right) HOLMIUM LASER APPLICATION (Right)  Patient Location: PACU  Anesthesia Type: General  Level of Consciousness: awake, sedated, patient cooperative and responds to stimulation  Airway & Oxygen Therapy: Patient Spontanous Breathing and Patient connected to face mask oxygen  Post-op Assessment: Report given to PACU RN, Post -op Vital signs reviewed and stable and Patient moving all extremities  Post vital signs: Reviewed and stable  Complications: No apparent anesthesia complications

## 2013-03-10 NOTE — H&P (Signed)
Urology History and Physical Exam  CC: Right ureter stone. Right nephrolithiasis. Left nephrolithiasis. Left flank pain.  HPI: 62 year old male presents today for right ureter stone and right nephrolithiasis. He presented with right-sided flank pain on 10/6 [place] 14. He previously had a CT scan which revealed a right proximal ureter stone which was 7 mm in size. There is also a right midpole stone which is 5 mm in size. He had significant pain and nausea. He had a right ureter stent placed on 03/01/13. CT scan also revealed that he had 2 left kidney stones. These were 6 mm a piece. They were nonobstructing. He presents today for cystoscopy, right ureteroscopy, laser lithotripsy, possible right retrograde PolyGram, and possible right ureter stent exchange. He states that he began having left-sided flank pain. This is been present for 2 days. It is sharp in nature. It begins in his back. Radiates to his ipsilateral abdomen. Is not associated with gross hematuria. Today we discussed also performing a left retrograde pyelogram and subsequent left ureteroscopy if there is a stone present. We also discussed placing a stent and perform interval ureteroscopy if ureteroscopy is not feasible this time.  PMH: Past Medical History  Diagnosis Date  . Seasonal allergies   . History of kidney stones     RIGHT SIDE 2007-  PASSED SPONTATEOUS  . Right ureteral stone   . Hypertension     CURRENTLY NO MEDS  . Frequency of urination   . Urgency of urination   . Hematuria     PSH: Past Surgical History  Procedure Laterality Date  . Closed reduction right wrist Right 1965  . Cystoscopy/retrograde/ureteroscopy Right 03/01/2013    Procedure: CYSTOSCOPY/RETROGRADE/URETEROSCOPY;  Surgeon: Milford Cage, MD;  Location: [facility];  Service: Urology;  Laterality: Right;  CYSTOSCOPY ,RIGHT URETERAL STENT PLACEMENT, RIGHT RETROGRADE PYELOGRAM  . Circumcision  AGE 57    Allergies: Allergies  Allergen Reactions  .  Percocet [Oxycodone-Acetaminophen] Nausea And Vomiting    Medications: Prescriptions prior to admission  Medication Sig Dispense Refill  . HYDROcodone-acetaminophen (NORCO/VICODIN) 5-325 MG per tablet Take 1-2 tablets by mouth every 4 (four) hours as needed for pain.  50 tablet  0  . hyoscyamine (LEVSIN, ANASPAZ) 0.125 MG tablet Take 1 tablet (0.125 mg total) by mouth every 4 (four) hours as needed for cramping (bladder spasms).  40 tablet  4  . ibuprofen (ADVIL,MOTRIN) 200 MG tablet Take 600 mg by mouth every 8 (eight) hours as needed for pain.      Marland Kitchen ondansetron (ZOFRAN-ODT) 8 MG disintegrating tablet Take 8 mg by mouth every 4 (four) hours as needed for nausea.      Marland Kitchen oxybutynin (DITROPAN) 5 MG tablet Take 1 tablet (5 mg total) by mouth every 6 (six) hours as needed (bladder spasms).  40 tablet  4  . phenazopyridine (PYRIDIUM) 200 MG tablet Take 1 tablet (200 mg total) by mouth 3 (three) times daily as needed for pain.  30 tablet  6  . senna-docusate (SENOKOT S) 8.6-50 MG per tablet Take 1 tablet by mouth 2 (two) times daily.  60 tablet  0  . tamsulosin (FLOMAX) 0.4 MG CAPS capsule Take 1 capsule (0.4 mg total) by mouth at bedtime.  30 capsule  3  . triamcinolone (NASACORT) 55 MCG/ACT nasal inhaler Place 2 sprays into the nose as needed.          Social History: History   Social History  . Marital Status: Divorced    Spouse  Name: N/A    Number of Children: N/A  . Years of Education: N/A   Occupational History  . Not on file.   Social History Main Topics  . Smoking status: Never Smoker   . Smokeless tobacco: Never Used  . Alcohol Use: No  . Drug Use: No  . Sexual Activity: Not on file   Other Topics Concern  . Not on file   Social History Narrative  . No narrative on file    Family History: History reviewed. No pertinent family history.  Review of Systems: Positive: Left flank pain. Urgency. Negative: Fever, SOB, or chest pain.  A further 10 point review of systems  was negative except what is listed in the HPI.  Physical Exam: Filed Vitals:   03/10/13 0712  BP: 160/96  Pulse: 78  Temp: 97.8 F (36.6 C)  Resp: 18    General: No acute distress.  Awake. Head:  Normocephalic.  Atraumatic. ENT:  EOMI.  Mucous membranes moist Neck:  Supple.  No lymphadenopathy. CV:  S1 present. S2 present. Regular rate. Pulmonary: Equal effort bilaterally.  Clear to auscultation bilaterally. Abdomen: Soft.  Non- tender to palpation. Skin:  Normal turgor.  No visible rash. Extremity: No gross deformity of bilateral upper extremities.  No gross deformity of    bilateral lower extremities. Neurologic: Alert. Appropriate mood.   Studies:  No results found for this basename: HGB, WBC, PLT,  in the last 72 hours  No results found for this basename: NA, K, CL, CO2, BUN, CREATININE, CALCIUM, MAGNESIUM, GFRNONAA, GFRAA,  in the last 72 hours   No results found for this basename: PT, INR, APTT,  in the last 72 hours   No components found with this basename: ABG,     Assessment:  Right nephrolithiasis, right ureter stone, left nephrolithiasis, left flank pain.  Plan: Proceed to the operating room for cystoscopy, right ureteroscopy, possible laser lithotripsy, possible right retrograde pyelogram, right ureter stent removal, possible right ureter stent placement, left retrograde pyelogram, and possible left ureteroscopy or stent placement.

## 2013-03-10 NOTE — Op Note (Signed)
Urology Operative Report  Date of Procedure: 03/10/13  Surgeon: Natalia Leatherwood, MD Assistant:  None  Preoperative Diagnosis: Right ureter stone. Right nephrolithiasis. Left flank pain. Left nephrolithiasis Postoperative Diagnosis:  Same  Procedure(s): Right ureteroscopy with laser lithotripsy of right ureter stone. Right ureteroscopy and removal of right kidney stone. Right retrograde pyelogram with interpretation. Right ureter stent removal. Right ureter stent placement. Left retrograde pyelogram with interpretation.  Estimated blood loss: Minimal  Specimen: Stones sent to [facility] lab for chemical analysis.  Drains: None  Complications: None  Findings: Right ureter stone. Right nephrolithiasis. Negative left ureter stone or hydronephrosis on left retrograde pyelogram. Minimal linear extravasation parallel to right proximal ureter with right proximal ureter mucosal disruption.  History of present illness: 62 year old male presented with right flank pain due to a right proximal ureter stone. He had a right ureter stent placed 10/6 [place] 14. He presents today for interval ureteroscopy for his right ureter stone and right renal stone. He also began having left flank pain prior to surgery. He is a known history of left nephrolithiasis.   Procedure in detail: After informed consent was obtained, the patient was taken to the operating room. They were placed in the supine position. SCDs were turned on and in place. IV antibiotics were infused, and general anesthesia was induced. A timeout was performed in which the correct patient, surgical site, and procedure were identified and agreed upon by the team.  The patient was placed in a dorsolithotomy position, making sure to pad all pertinent neurovascular pressure points. A belladonna and opium suppository was placed into the rectum. The genitals were prepped and draped in the usual sterile fashion.  A rigid cystoscope was advanced through the  urethra and into the bladder. Attention was turned the right ureter orifice. Fluoroscopy was performed along the length of the indwelling stent in the right ureter stone was noted in the right proximal ureter. A sensor tip wire was placed along side the right ureter stent and into the right renal pelvis. A second sensor wire was placed through the ureter stent after the tip of the stent was grasped and brought to the urethral meatus. The ureter stent was removed. Because of the diminutive nature of his right ureter orifice I elected to place a 12-14 ureter access sheath for access to the ureter. This was placed over the working wire while the other wire was used as a Museum/gallery conservator. I gently placed the obturator over the wire under fluoroscopy and then the obturator and sheath. Both of these were placed easily into the distal ureter. I then advanced a flexible digital ureter scope through the access sheath and into the distal ureter but encountered and narrowing at approximately the level of the iliac vessels. I then removed the ureter scope and placed a sensor wire through the access sheath into the right renal pelvis and placed the obturator over this wire after removing the sheath. This was able to be placed into the proximal ureter just distal to the ureter stone. I then placed the access sheath and operator over this working wire with ease and remove the operator and wire. I was unable to advance the digital ureter scope into the proximal ureter where the right ureter stone was visualized. This was too small to be removed without lithotripsy. I then placed a 200  holmium laser filament perform lithotripsy at 0.5 J and 20 Hz breaking the stone into several large pieces. These large pieces were removed with a 0 tip  Nitinol basket.  I then navigated the ureteroscope into the kidney. The kidney was evaluated in a systematic fashion to visualize the entire internal portions of the kidney. He had one area that  appeared to be in the mid to lower pole kidney where he had the beginnings of calyceal narrowing. The right midpole renal stone which was observed CT scan was identified and removed with a 0 tip Nitinol basket. There were no other stones noted in the kidney but there were Randall's plaques. He also had a very prominent complex papilla in the lower pole kidney.  The ureter scope and ureter access sheath were then slowly removed to visualize the entire surface of the ureter. The area in the mid to proximal ureter where there had been narrowing requiring placement of the ureter access sheath showed an area of mucosal disruption. The muscularis appeared intact. The remainder of the ureter was visualized and access sheath was removed. I then placed the cystoscope back into the bladder and cannulated the right ureter orifice with a 5 [place] ureter catheter and injected contrast to obtain a retrograde pyelogram. There was a small amount of possible extravasation that was parallel and linear to the ureter which could represent Extravasation outside the mucosa but this was contained within the muscularis. Because of this I elected to place a stent. I placed a 6 x 26 Polaris stent without the tethering string. This was done over the safety wire with a good curl in the right renal pelvis and the loops placed well in the bladder.  Attention was turned to the left ureter orifice which was cannulated with a 5 French ureter catheter. I injected contrast to obtain a retrograde pyelogram. There were no filling defects ongoing for the ureter and there was no hydronephrosis. This side in the out well. It appeared that I couldn't visualize both of the left renal stone seen on CT scan. These were not obstructing.  The bladder was then drained, I injected 10 cc of lidocaine jelly into the urethra, and this completed the procedure. The patient's placed in supine position, anesthesia was reversed, and he was taken to the [facility] in  stable condition.  All counts were correct at the end of the case.  I recommend that he leave a ureter stent in place for 4-6 weeks if he is able to tolerate this.

## 2013-03-10 NOTE — Anesthesia Preprocedure Evaluation (Signed)
Anesthesia Evaluation  Patient identified by MRN, date of birth, ID band Patient awake    Reviewed: Allergy & Precautions, H&P , NPO status , Patient's Chart, lab work & pertinent test results  Airway Mallampati: II TM Distance: >3 FB Neck ROM: full    Dental no notable dental hx. (+) Dental Advisory Given   Pulmonary neg pulmonary ROS,  breath sounds clear to auscultation  Pulmonary exam normal       Cardiovascular Exercise Tolerance: Good hypertension, Rhythm:regular Rate:Normal     Neuro/Psych negative neurological ROS  negative psych ROS   GI/Hepatic negative GI ROS, Neg liver ROS,   Endo/Other  negative endocrine ROS  Renal/GU negative Renal ROS  negative genitourinary   Musculoskeletal   Abdominal   Peds  Hematology negative hematology ROS (+)   Anesthesia Other Findings   Reproductive/Obstetrics negative OB ROS                           Anesthesia Physical  Anesthesia Plan  ASA: II  Anesthesia Plan: General   Post-op Pain Management:    Induction: Intravenous  Airway Management Planned: LMA  Additional Equipment:   Intra-op Plan:   Post-operative Plan:   Informed Consent: I have reviewed the patients History and Physical, chart, labs and discussed the procedure including the risks, benefits and alternatives for the proposed anesthesia with the patient or authorized representative who has indicated his/her understanding and acceptance.   Dental Advisory Given  Plan Discussed with: CRNA and Surgeon  Anesthesia Plan Comments:         Anesthesia Quick Evaluation

## 2013-03-10 NOTE — Anesthesia Procedure Notes (Signed)
Procedure Name: LMA Insertion Date/Time: 03/10/2013 8:37 AM Performed by: Jessica Priest Pre-anesthesia Checklist: Patient identified, Emergency Drugs available, Suction available and Patient being monitored Patient Re-evaluated:Patient Re-evaluated prior to inductionOxygen Delivery Method: Circle System Utilized Preoxygenation: Pre-oxygenation with 100% oxygen Intubation Type: IV induction Ventilation: Mask ventilation without difficulty LMA: LMA inserted LMA Size: 4.0 Number of attempts: 1 Airway Equipment and Method: bite block Placement Confirmation: positive ETCO2 Tube secured with: Tape Dental Injury: Teeth and Oropharynx as per pre-operative assessment

## 2013-03-11 ENCOUNTER — Encounter (HOSPITAL_BASED_OUTPATIENT_CLINIC_OR_DEPARTMENT_OTHER): Payer: Self-pay | Admitting: Urology

## 2014-02-12 ENCOUNTER — Emergency Department (HOSPITAL_COMMUNITY)
Admission: EM | Admit: 2014-02-12 | Discharge: 2014-02-12 | Discharge status: Against Medical Advice | Payer: 59 | Source: Home / Self Care | Attending: Emergency Medicine | Admitting: Emergency Medicine

## 2014-02-12 ENCOUNTER — Emergency Department (HOSPITAL_COMMUNITY): Payer: 59 | Admitting: Anesthesiology

## 2014-02-12 ENCOUNTER — Encounter (HOSPITAL_COMMUNITY)
Admission: EM | Disposition: A | Discharge status: Home / Self Care | Payer: Self-pay | Source: Home / Self Care | Attending: Emergency Medicine

## 2014-02-12 ENCOUNTER — Encounter (HOSPITAL_COMMUNITY): Payer: Self-pay | Admitting: Emergency Medicine

## 2014-02-12 ENCOUNTER — Encounter (HOSPITAL_COMMUNITY): Payer: 59 | Admitting: Anesthesiology

## 2014-02-12 ENCOUNTER — Emergency Department (HOSPITAL_COMMUNITY): Payer: 59

## 2014-02-12 ENCOUNTER — Ambulatory Visit (HOSPITAL_COMMUNITY)
Admission: EM | Admit: 2014-02-12 | Discharge: 2014-02-12 | Disposition: A | Discharge status: Home / Self Care | Payer: 59 | Attending: Emergency Medicine | Admitting: Emergency Medicine

## 2014-02-12 DIAGNOSIS — N201 Calculus of ureter: Secondary | ICD-10-CM | POA: Diagnosis present

## 2014-02-12 DIAGNOSIS — Z87442 Personal history of urinary calculi: Secondary | ICD-10-CM | POA: Insufficient documentation

## 2014-02-12 DIAGNOSIS — I10 Essential (primary) hypertension: Secondary | ICD-10-CM | POA: Insufficient documentation

## 2014-02-12 DIAGNOSIS — R319 Hematuria, unspecified: Secondary | ICD-10-CM

## 2014-02-12 DIAGNOSIS — R109 Unspecified abdominal pain: Secondary | ICD-10-CM

## 2014-02-12 DIAGNOSIS — N1330 Unspecified hydronephrosis: Secondary | ICD-10-CM | POA: Diagnosis not present

## 2014-02-12 DIAGNOSIS — R112 Nausea with vomiting, unspecified: Secondary | ICD-10-CM

## 2014-02-12 DIAGNOSIS — Z885 Allergy status to narcotic agent status: Secondary | ICD-10-CM | POA: Insufficient documentation

## 2014-02-12 DIAGNOSIS — N132 Hydronephrosis with renal and ureteral calculous obstruction: Secondary | ICD-10-CM

## 2014-02-12 HISTORY — PX: CYSTOSCOPY W/ URETERAL STENT PLACEMENT: SHX1429

## 2014-02-12 LAB — COMPREHENSIVE METABOLIC PANEL
ALT: 26 U/L (ref 0–53)
AST: 32 U/L (ref 0–37)
Albumin: 4.6 g/dL (ref 3.5–5.2)
Alkaline Phosphatase: 78 U/L (ref 39–117)
Anion gap: 13 (ref 5–15)
BUN: 16 mg/dL (ref 6–23)
CHLORIDE: 98 meq/L (ref 96–112)
CO2: 28 meq/L (ref 19–32)
CREATININE: 1.44 mg/dL — AB (ref 0.50–1.35)
Calcium: 9.5 mg/dL (ref 8.4–10.5)
GFR, EST AFRICAN AMERICAN: 57 mL/min — AB (ref >90)
GFR, EST NON AFRICAN AMERICAN: 50 mL/min — AB (ref >90)
GLUCOSE: 128 mg/dL — AB (ref 70–99)
Potassium: 4 mEq/L (ref 3.7–5.3)
Sodium: 139 mEq/L (ref 137–147)
Total Bilirubin: 1.1 mg/dL (ref 0.3–1.2)
Total Protein: 7.9 g/dL (ref 6.0–8.3)

## 2014-02-12 LAB — URINALYSIS, ROUTINE W REFLEX MICROSCOPIC
BILIRUBIN URINE: NEGATIVE
Bilirubin Urine: NEGATIVE
GLUCOSE, UA: 500 mg/dL — AB
Glucose, UA: NEGATIVE mg/dL
Hgb urine dipstick: NEGATIVE
Ketones, ur: NEGATIVE mg/dL
Ketones, ur: NEGATIVE mg/dL
Leukocytes, UA: NEGATIVE
Leukocytes, UA: NEGATIVE
Nitrite: NEGATIVE
Nitrite: NEGATIVE
PH: 7.5 (ref 5.0–8.0)
PH: 6 (ref 5.0–8.0)
PROTEIN: NEGATIVE mg/dL
Protein, ur: NEGATIVE mg/dL
SPECIFIC GRAVITY, URINE: 1.016 (ref 1.005–1.030)
Specific Gravity, Urine: 1.021 (ref 1.005–1.030)
Urobilinogen, UA: 1 mg/dL (ref 0.0–1.0)
Urobilinogen, UA: 1 mg/dL (ref 0.0–1.0)

## 2014-02-12 LAB — CBC WITH DIFFERENTIAL/PLATELET
BASOS ABS: 0 10*3/uL (ref 0.0–0.1)
Basophils Relative: 0 % (ref 0–1)
Eosinophils Absolute: 0 10*3/uL (ref 0.0–0.7)
Eosinophils Relative: 0 % (ref 0–5)
HCT: 43.8 % (ref 39.0–52.0)
HEMOGLOBIN: 15.7 g/dL (ref 13.0–17.0)
LYMPHS ABS: 1.7 10*3/uL (ref 0.7–4.0)
LYMPHS PCT: 13 % (ref 12–46)
MCH: 31.6 pg (ref 26.0–34.0)
MCHC: 35.8 g/dL (ref 30.0–36.0)
MCV: 88.1 fL (ref 78.0–100.0)
MONO ABS: 1.4 10*3/uL — AB (ref 0.1–1.0)
MONOS PCT: 11 % (ref 3–12)
NEUTROS ABS: 9.6 10*3/uL — AB (ref 1.7–7.7)
Neutrophils Relative %: 76 % (ref 43–77)
Platelets: 208 10*3/uL (ref 150–400)
RBC: 4.97 MIL/uL (ref 4.22–5.81)
RDW: 12.8 % (ref 11.5–15.5)
WBC: 12.8 10*3/uL — AB (ref 4.0–10.5)

## 2014-02-12 LAB — URINE MICROSCOPIC-ADD ON

## 2014-02-12 LAB — LIPASE, BLOOD: LIPASE: 20 U/L (ref 11–59)

## 2014-02-12 SURGERY — CYSTOSCOPY, WITH RETROGRADE PYELOGRAM AND URETERAL STENT INSERTION
Anesthesia: General | Site: Ureter | Laterality: Left

## 2014-02-12 MED ORDER — TAMSULOSIN HCL 0.4 MG PO CAPS: 0.4000 mg | ORAL_CAPSULE | Freq: Every evening | ORAL | Status: DC | PRN

## 2014-02-12 MED ORDER — PHENAZOPYRIDINE HCL 100 MG PO TABS: 100.0000 mg | ORAL_TABLET | Freq: Three times a day (TID) | ORAL | Status: DC | PRN

## 2014-02-12 MED ORDER — SODIUM CHLORIDE 0.9 % IV SOLN
1000.0000 mL | Freq: Once | INTRAVENOUS | Status: AC
  Administered 2014-02-12: 1000 mL via INTRAVENOUS

## 2014-02-12 MED ORDER — SUCCINYLCHOLINE CHLORIDE 20 MG/ML IJ SOLN
INTRAMUSCULAR | Status: DC | PRN
  Administered 2014-02-12: 100 mg via INTRAVENOUS

## 2014-02-12 MED ORDER — FENTANYL CITRATE 0.05 MG/ML IJ SOLN
INTRAMUSCULAR | Status: AC
  Filled 2014-02-12: qty 2

## 2014-02-12 MED ORDER — LACTATED RINGERS IV SOLN
INTRAVENOUS | Status: DC | PRN
  Administered 2014-02-12: 15:00:00 via INTRAVENOUS

## 2014-02-12 MED ORDER — CIPROFLOXACIN IN D5W 400 MG/200ML IV SOLN
INTRAVENOUS | Status: AC
  Filled 2014-02-12: qty 200

## 2014-02-12 MED ORDER — ONDANSETRON HCL 4 MG/2ML IJ SOLN
4.0000 mg | Freq: Once | INTRAMUSCULAR | Status: AC
  Administered 2014-02-12: 4 mg via INTRAVENOUS
  Filled 2014-02-12: qty 2

## 2014-02-12 MED ORDER — CIPROFLOXACIN IN D5W 400 MG/200ML IV SOLN
400.0000 mg | Freq: Once | INTRAVENOUS | Status: AC
  Administered 2014-02-12: 400 mg via INTRAVENOUS

## 2014-02-12 MED ORDER — MIDAZOLAM HCL 2 MG/2ML IJ SOLN
INTRAMUSCULAR | Status: AC
  Filled 2014-02-12: qty 2

## 2014-02-12 MED ORDER — FENTANYL CITRATE 0.05 MG/ML IJ SOLN
100.0000 ug | Freq: Once | INTRAMUSCULAR | Status: DC
  Filled 2014-02-12: qty 2

## 2014-02-12 MED ORDER — SODIUM CHLORIDE 0.9 % IR SOLN
Status: DC | PRN
  Administered 2014-02-12: 3000 mL

## 2014-02-12 MED ORDER — ONDANSETRON HCL 4 MG/2ML IJ SOLN
INTRAMUSCULAR | Status: DC | PRN
  Administered 2014-02-12: 4 mg via INTRAVENOUS

## 2014-02-12 MED ORDER — OXYBUTYNIN CHLORIDE 5 MG PO TABS
5.0000 mg | ORAL_TABLET | Freq: Three times a day (TID) | ORAL | Status: DC
Start: 2014-02-12 — End: 2014-07-07

## 2014-02-12 MED ORDER — FENTANYL CITRATE 0.05 MG/ML IJ SOLN
INTRAMUSCULAR | Status: DC | PRN
  Administered 2014-02-12: 100 ug via INTRAVENOUS

## 2014-02-12 MED ORDER — ONDANSETRON 4 MG PO TBDP
4.0000 mg | ORAL_TABLET | Freq: Once | ORAL | Status: AC
  Administered 2014-02-12: 4 mg via ORAL
  Filled 2014-02-12: qty 1

## 2014-02-12 MED ORDER — IOHEXOL 300 MG/ML  SOLN
INTRAMUSCULAR | Status: DC | PRN
  Administered 2014-02-12: 5 mL

## 2014-02-12 MED ORDER — LIDOCAINE HCL (PF) 2 % IJ SOLN
INTRAMUSCULAR | Status: DC | PRN
  Administered 2014-02-12: 75 mg via INTRADERMAL

## 2014-02-12 MED ORDER — FENTANYL CITRATE 0.05 MG/ML IJ SOLN
100.0000 ug | Freq: Once | INTRAMUSCULAR | Status: AC
  Administered 2014-02-12: 100 ug via INTRAMUSCULAR

## 2014-02-12 MED ORDER — FENTANYL CITRATE 0.05 MG/ML IJ SOLN: 25.0000 ug | INTRAMUSCULAR | Status: DC | PRN

## 2014-02-12 MED ORDER — ONDANSETRON HCL 4 MG/2ML IJ SOLN
INTRAMUSCULAR | Status: AC
  Filled 2014-02-12: qty 2

## 2014-02-12 MED ORDER — HYDROMORPHONE HCL 1 MG/ML IJ SOLN
1.0000 mg | INTRAMUSCULAR | Status: AC | PRN
  Administered 2014-02-12 (×3): 1 mg via INTRAVENOUS
  Filled 2014-02-12 (×3): qty 1

## 2014-02-12 MED ORDER — PROPOFOL 10 MG/ML IV BOLUS
INTRAVENOUS | Status: AC
  Filled 2014-02-12: qty 20

## 2014-02-12 MED ORDER — LIDOCAINE HCL (CARDIAC) 20 MG/ML IV SOLN
INTRAVENOUS | Status: AC
  Filled 2014-02-12: qty 5

## 2014-02-12 MED ORDER — MIDAZOLAM HCL 5 MG/5ML IJ SOLN
INTRAMUSCULAR | Status: DC | PRN
  Administered 2014-02-12: 2 mg via INTRAVENOUS

## 2014-02-12 MED ORDER — CIPROFLOXACIN HCL 500 MG PO TABS: 500.0000 mg | ORAL_TABLET | Freq: Two times a day (BID) | ORAL | Status: DC

## 2014-02-12 MED ORDER — PROPOFOL 10 MG/ML IV BOLUS
INTRAVENOUS | Status: DC | PRN
  Administered 2014-02-12: 200 mg via INTRAVENOUS

## 2014-02-12 MED ORDER — SODIUM CHLORIDE 0.9 % IV SOLN
1000.0000 mL | INTRAVENOUS | Status: DC
  Administered 2014-02-12: 1000 mL via INTRAVENOUS

## 2014-02-12 MED ORDER — HYDROMORPHONE HCL 1 MG/ML IJ SOLN
1.0000 mg | Freq: Once | INTRAMUSCULAR | Status: AC
  Administered 2014-02-12: 1 mg via INTRAVENOUS
  Filled 2014-02-12: qty 1

## 2014-02-12 MED ORDER — LACTATED RINGERS IV SOLN: INTRAVENOUS | Status: DC

## 2014-02-12 SURGICAL SUPPLY — 4 items
BAG URO CATCHER STRL LF (DRAPE) ×2 IMPLANT
PACK CYSTO (CUSTOM PROCEDURE TRAY) ×2 IMPLANT
STENT CONTOUR 6FRX24X.038 (STENTS) ×2 IMPLANT
TUBING CONNECTING 10 (TUBING) ×2 IMPLANT

## 2014-02-12 NOTE — Discharge Instructions (Signed)
1. You may see some blood in the urine and may have some burning with urination for 48-72 hours. You also may notice that you have to urinate more frequently or urgently after your procedure which is normal.  2. You should call should you develop an inability urinate, fever > 101, persistent nausea and vomiting that prevents you from eating or drinking to stay hydrated.  3. You have a stent. You will likely urinate more frequently and urgently until the stent is removed and you may experience some discomfort/pain in the lower abdomen and flank especially when urinating. You may take pain medication prescribed to you if needed for pain. You may also intermittently have blood in the urine until the stent is removed.

## 2014-02-12 NOTE — Discharge Instructions (Signed)

## 2014-02-12 NOTE — ED Notes (Addendum)
patient c/o left flank pain. Reports hx of kidney stones, states pain feels the same. Patient is seen at [facility]. Patient reports hematuria on Wednesday that was intermittent. Patient also c/o nausea and vomiting x1 episode earlier in the evening after eating some "questionable" meat."

## 2014-02-12 NOTE — ED Notes (Signed)
Patient is going to Ct and will call when he can urinate

## 2014-02-12 NOTE — Progress Notes (Signed)
pacu nursing note: Valuables retrieved from security and returned to patient. All contents verified and signed for.

## 2014-02-12 NOTE — ED Provider Notes (Signed)
CSN: [id number]     Arrival date & time 02/12/14  0234 History   First MD Initiated Contact with Patient 02/12/14 [phone]     Chief Complaint  Patient presents with  . Flank Pain    left   Patient is a 63 y.o. male presenting with flank pain. The history is provided by the patient. No language interpreter was used.  Flank Pain This is a new (Left sided) problem. The current episode started yesterday. The problem occurs constantly. The problem has been gradually worsening. Associated symptoms include abdominal pain, nausea and vomiting. Pertinent negatives include no anorexia, arthralgias, change in bowel habit, chest pain, chills, congestion, coughing, diaphoresis, fatigue, fever, headaches, joint swelling, myalgias, neck pain, numbness, rash, sore throat, swollen glands, vertigo, visual change or weakness. Associated symptoms comments: Hematuria. Nothing aggravates the symptoms. He has tried NSAIDs and walking for the symptoms. The treatment provided no relief.    Past Medical History  Diagnosis Date  . Seasonal allergies   . History of kidney stones     RIGHT SIDE 2007-  PASSED SPONTATEOUS  . Right ureteral stone   . Hypertension     CURRENTLY NO MEDS  . Frequency of urination   . Urgency of urination   . Hematuria    Past Surgical History  Procedure Laterality Date  . Closed reduction right wrist Right 1965  . Cystoscopy/retrograde/ureteroscopy Right 03/01/2013    Procedure: CYSTOSCOPY/RETROGRADE/URETEROSCOPY;  Surgeon: Molli Hazard, MD;  Location: [facility];  Service: Urology;  Laterality: Right;  CYSTOSCOPY ,RIGHT URETERAL STENT PLACEMENT, RIGHT RETROGRADE PYELOGRAM  . Circumcision  AGE 85  . Cystoscopy with retrograde pyelogram, ureteroscopy and stent placement Right 03/10/2013    Procedure: CYSTOSCOPY WITH RETROGRADE PYELOGRAM, URETEROSCOPY AND STENT PLACEMENT, left retrograde   " RIGHT STENT EXCHANGE AND POSSIBLE RIGHT RETROGRADE  PYELOGRAM";  Surgeon: Molli Hazard,  MD;  Location: [facility];  Service: Urology;  Laterality: Right;  . Holmium laser application Right 13/24/4010    Procedure: HOLMIUM LASER APPLICATION;  Surgeon: Molli Hazard, MD;  Location: [facility];  Service: Urology;  Laterality: Right;   History reviewed. No pertinent family history. History  Substance Use Topics  . Smoking status: Never Smoker   . Smokeless tobacco: Never Used  . Alcohol Use: No    Review of Systems  Constitutional: Negative for fever, chills, diaphoresis and fatigue.  HENT: Negative for congestion and sore throat.   Respiratory: Negative for cough.   Cardiovascular: Negative for chest pain.  Gastrointestinal: Positive for nausea, vomiting and abdominal pain. Negative for anorexia and change in bowel habit.  Genitourinary: Positive for flank pain.  Musculoskeletal: Negative for arthralgias, joint swelling, myalgias and neck pain.  Skin: Negative for rash.  Neurological: Negative for vertigo, weakness, numbness and headaches.  All other systems reviewed and are negative.     Allergies  Percocet and Vicodin  Home Medications   Prior to Admission medications   Not on File   BP 183/117  Pulse 75  Temp(Src) 97.6 F (36.4 C) (Oral)  Resp 20  Ht 5\' 8"  (1.727 m)  Wt 194 lb (87.998 kg)  BMI 29.50 kg/m2  SpO2 99% Physical Exam  Nursing note and vitals reviewed. Constitutional: He is oriented to person, place, and time. He appears well-developed and well-nourished. No distress.  HENT:  Head: Normocephalic and atraumatic.  Mouth/Throat: Oropharynx is clear and moist. No oropharyngeal exudate.  Eyes: Conjunctivae are normal. Pupils are equal, round, and reactive to  light. No scleral icterus.  Neck: Normal range of motion. Neck supple. No JVD present. No thyromegaly present.  Cardiovascular: Normal rate, regular rhythm, normal heart sounds and intact distal pulses.  Exam reveals no gallop and no friction rub.    No murmur heard. Pulmonary/Chest: Effort normal and breath sounds normal. No respiratory distress. He has no wheezes. He has no rales. He exhibits no tenderness.  Abdominal: Soft. Bowel sounds are normal. He exhibits no distension and no mass. There is no tenderness. There is CVA tenderness. There is no rebound and no guarding.  Musculoskeletal: Normal range of motion.  Lymphadenopathy:    He has no cervical adenopathy.  Neurological: He is alert and oriented to person, place, and time.  Skin: He is not diaphoretic.    ED Course  Procedures (including critical care time) Labs Review Labs Reviewed  URINALYSIS, ROUTINE W REFLEX MICROSCOPIC - Abnormal; Notable for the following:    Glucose, UA 500 (*)    Hgb urine dipstick SMALL (*)    All other components within normal limits  URINE MICROSCOPIC-ADD ON    Imaging Review No results found.   EKG Interpretation None      MDM   Final diagnoses:  Hematuria   Patient is a 63 y.o. Male who presents to the ED with Left flank pain and hematuria.  Physical exam reveals some left CVA tenderness but is otherwise unremarkable.  UA shows hematuria without evidence of infection. CT scan from 02/2013 reveals 6 mm left sided renal calculus.  I ordered a renal US and BMP to check kidney function and to look for obstruction but patient refused stating that he had to leave to go to work.  Patient was treated here with IM fentanyl and zofran.  Patient left AMA.  The risks of an infected or obstructed stones were discussed and patient was told to return for any concerning symptoms.  Patient understands the consequences of leaving AMA.  Patient discharged at this time.  Patient was discussed with Dr. Lita Mains.      Cherylann Parr, PA-C 02/12/14 [phone]

## 2014-02-12 NOTE — Anesthesia Postprocedure Evaluation (Signed)
  Anesthesia Post-op Note  Patient: Daniel Holland  Procedure(s) Performed: Procedure(s) (LRB): CYSTOSCOPY WITH RETROGRADE PYELOGRAM/URETERAL STENT PLACEMENT (Left)  Patient Location: PACU  Anesthesia Type: General  Level of Consciousness: awake and alert   Airway and Oxygen Therapy: Patient Spontanous Breathing  Post-op Pain: mild  Post-op Assessment: Post-op Vital signs reviewed, Patient's Cardiovascular Status Stable, Respiratory Function Stable, Patent Airway and No signs of Nausea or vomiting  Last Vitals:  Filed Vitals:   02/12/14 1725  BP:   Pulse: 81  Temp:   Resp: 12    Post-op Vital Signs: stable   Complications: No apparent anesthesia complications

## 2014-02-12 NOTE — Anesthesia Preprocedure Evaluation (Addendum)
Anesthesia Evaluation  Patient identified by MRN, date of birth, ID band Patient awake    Reviewed: Allergy & Precautions, H&P , NPO status , Patient's Chart, lab work & pertinent test results  Airway Mallampati: II TM Distance: >3 FB Neck ROM: full    Dental  (+) Caps, Dental Advisory Given Caps 2 front upper:   Pulmonary neg pulmonary ROS,  breath sounds clear to auscultation  Pulmonary exam normal       Cardiovascular Exercise Tolerance: Good hypertension, Rhythm:regular Rate:Normal     Neuro/Psych negative neurological ROS  negative psych ROS   GI/Hepatic negative GI ROS, Neg liver ROS,   Endo/Other  negative endocrine ROS  Renal/GU negative Renal ROS  negative genitourinary   Musculoskeletal   Abdominal   Peds  Hematology negative hematology ROS (+)   Anesthesia Other Findings   Reproductive/Obstetrics negative OB ROS                          Anesthesia Physical Anesthesia Plan  ASA: II  Anesthesia Plan: General   Post-op Pain Management:    Induction:   Airway Management Planned: Oral ETT  Additional Equipment:   Intra-op Plan:   Post-operative Plan: Extubation in OR  Informed Consent:   Plan Discussed with: Surgeon  Anesthesia Plan Comments:        Anesthesia Quick Evaluation

## 2014-02-12 NOTE — Transfer of Care (Signed)
Immediate Anesthesia Transfer of Care Note  Patient: Daniel Holland  Procedure(s) Performed: Procedure(s): CYSTOSCOPY WITH RETROGRADE PYELOGRAM/URETERAL STENT PLACEMENT (Left)  Patient Location: PACU  Anesthesia Type:General  Level of Consciousness: sedated and responds to stimulation  Airway & Oxygen Therapy: Patient Spontanous Breathing and Patient connected to face mask oxygen  Post-op Assessment: Report given to PACU RN and Post -op Vital signs reviewed and stable  Post vital signs: Reviewed and stable  Complications: No apparent anesthesia complications

## 2014-02-12 NOTE — Consult Note (Signed)
Urology Consult   Physician requesting consult: Dr. Dorie Rank  Reason for consult: Left proximal ureteral stone  History of Present Illness: Daniel Holland is a 63 y.o. male with prior history of nephrolithiasis. He passed a stone spontaneously in 2007 on the right and required right ureteral stent placement with subsequent ureteroscopic treatment in 02/2013. He tolerated this well.   He presents with 48 hours of severe right flank pain and nausea/vomiting. He took 4 advil at home but didn't have any relief from this because he promptly vomited. His pain and vomiting have not been controlled in the ER with multiple doses of dilaudid. He has not received toradol due to elevated Cr.  Denies a history of voiding or storage urinary symptoms, hematuria, UTIs, STDs, GU malignancy/trauma.  Past Medical History  Diagnosis Date  . Seasonal allergies   . History of kidney stones     RIGHT SIDE 2007-  PASSED SPONTATEOUS  . Right ureteral stone   . Hypertension     CURRENTLY NO MEDS  . Frequency of urination   . Urgency of urination   . Hematuria     Past Surgical History  Procedure Laterality Date  . Closed reduction right wrist Right 1965  . Cystoscopy/retrograde/ureteroscopy Right 03/01/2013    Procedure: CYSTOSCOPY/RETROGRADE/URETEROSCOPY;  Surgeon: Molli Hazard, MD;  Location: [facility];  Service: Urology;  Laterality: Right;  CYSTOSCOPY ,RIGHT URETERAL STENT PLACEMENT, RIGHT RETROGRADE PYELOGRAM  . Circumcision  AGE 23  . Cystoscopy with retrograde pyelogram, ureteroscopy and stent placement Right 03/10/2013    Procedure: CYSTOSCOPY WITH RETROGRADE PYELOGRAM, URETEROSCOPY AND STENT PLACEMENT, left retrograde   " RIGHT STENT EXCHANGE AND POSSIBLE RIGHT RETROGRADE  PYELOGRAM";  Surgeon: Molli Hazard, MD;  Location: [facility];  Service: Urology;  Laterality: Right;  . Holmium laser application Right 80/99/8338    Procedure: HOLMIUM LASER APPLICATION;  Surgeon:  Molli Hazard, MD;  Location: [facility];  Service: Urology;  Laterality: Right;    Medications:  Home meds:    Medication List    ASK your doctor about these medications       ibuprofen 200 MG tablet  Commonly known as:  ADVIL,MOTRIN  Take 800 mg by mouth every 6 (six) hours as needed (pain.).     loratadine 10 MG tablet  Commonly known as:  CLARITIN  Take 10 mg by mouth daily as needed for allergies.        Scheduled Meds:  Continuous Infusions: . sodium chloride     PRN Meds:.  Allergies:  Allergies  Allergen Reactions  . Percocet [Oxycodone-Acetaminophen] Nausea And Vomiting  . Vicodin [Hydrocodone-Acetaminophen] Nausea And Vomiting    History reviewed. No pertinent family history.  Social History:  reports that he has never smoked. He has never used smokeless tobacco. He reports that he does not drink alcohol or use illicit drugs.  ROS: A complete review of systems was performed.  All systems are negative except for pertinent findings as noted.  Physical Exam:  Vital signs in last 24 hours: Temp:  [97.6 F (36.4 C)-98.1 F (36.7 C)] 98.1 F (36.7 C) (09/19 1103) Pulse Rate:  [68-106] 103 (09/19 1421) Resp:  [18-20] 19 (09/19 1421) BP: (155-187)/(89-117) 155/89 mmHg (09/19 1421) SpO2:  [98 %-100 %] 99 % (09/19 1421) Weight:  [87.998 kg (194 lb)] 87.998 kg (194 lb) (09/19 0240) Constitutional:  Alert and oriented, No acute distress Cardiovascular: Regular rate and rhythm, No JVD Respiratory: Normal respiratory effort, Lungs clear  bilaterally GI: Abdomen is soft, nontender, nondistended, no abdominal masses Genitourinary: No CVAT. Normal male phallus, testes are descended bilaterally and non-tender and without masses, scrotum is normal in appearance without lesions or masses, perineum is normal on inspection. Lymphatic: No lymphadenopathy Neurologic: Grossly intact, no focal deficits Psychiatric: Normal mood and  affect  Laboratory Data:   Recent Labs  02/12/14 1211  WBC 12.8*  HGB 15.7  HCT 43.8  PLT 208     Recent Labs  02/12/14 1211  NA 139  K 4.0  CL 98  GLUCOSE 128*  BUN 16  CALCIUM 9.5  CREATININE 1.44*   Urinalysis    Component Value Date/Time   COLORURINE YELLOW 02/12/2014 [place] 02/12/2014 1235   LABSPEC 1.021 02/12/2014 1235   PHURINE 6.0 02/12/2014 1235   GLUCOSEU NEGATIVE 02/12/2014 1235   HGBUR NEGATIVE 02/12/2014 [place] 02/12/2014 1235   KETONESUR NEGATIVE 02/12/2014 1235   PROTEINUR NEGATIVE 02/12/2014 1235   UROBILINOGEN 1.0 02/12/2014 1235   NITRITE NEGATIVE 02/12/2014 1235   LEUKOCYTESUR NEGATIVE 02/12/2014 1235     Renal Function:  Recent Labs  02/12/14 1211  CREATININE 1.44*   The CrCl is unknown because both a height and weight (above a minimum accepted value) are required for this calculation.  Radiologic Imaging: Ct Abdomen Pelvis Wo Contrast  02/12/2014   CLINICAL DATA:  Left flank pain.  History of nephrolithiasis.  EXAM: CT ABDOMEN AND PELVIS WITHOUT CONTRAST  TECHNIQUE: Multidetector CT imaging of the abdomen and pelvis was performed following the standard protocol without IV contrast.  COMPARISON:  03/01/2013.  FINDINGS: 8 mm calculus at the left ureteropelvic junction with associated mild dilatation of the left renal collecting system and left perinephric soft tissue stranding. 4 mm calculus in the left renal pelvis. 6 mm calculus in the upper pole of the left kidney. No ureteral, right kidney or bladder calculi are seen. Normal-sized prostate gland.  Unremarkable non contrasted appearance of the liver, spleen, pancreas, gallbladder and adrenal glands. Sigmoid colon diverticula. No enlarged lymph nodes. Normal appearing appendix. Small left inguinal hernia containing fat. Minimal scarring in the lingula. Mild lumbar and lower thoracic spine degenerative changes and more pronounced degenerative changes at the L5-S1  level.  IMPRESSION: 1. 8 mm arm left UPJ calculus causing mild left hydronephrosis with perinephric soft tissue stranding. 2. 4 mm left nonobstructing renal pelvis calculus. 3. 6 mm nonobstructing upper pole left renal calculus. 4. Small left inguinal hernia containing fat. 5. Sigmoid diverticulosis.   Electronically Signed   By: Enrique Sack M.D.   On: 02/12/2014 13:09    I independently reviewed the above imaging studies: large left renal stone with additional small renal stones, mild hydronephrosis and perinephric stranding.  Impression/Recommendation 49M with large left proximal ureteral stone with severe renal colic pain, unrelieved by narcotics, and persistent vomiting.   Discussed options for treatment. Given severity of symptoms and intractable pain and vomiting,as well as large size and location of the stone, medical expulsive therapy is unlikely to be successful. We discussed delayed intervention with ESWL vs stent with subsequent USE in staged fashion. Risks, benefits and alternatives were discussed and he would like to proceed with stent. Informed consent was signed and the patient was marked.  Discussed with Dr. Risa Grill who agrees.  I performed a history and physical examination of the patient and discussed his management with the resident.  I reviewed the resident's note and agree with the documented findings and plan  of care.

## 2014-02-12 NOTE — ED Provider Notes (Signed)
CSN: [id number]     Arrival date & time 02/12/14  1100 History   First MD Initiated Contact with Patient 02/12/14 1145     Chief Complaint  Patient presents with  . Flank Pain     HPI This past Tuesday he noticed some discoloration in his urine.  Yest around 6pm he started having pian in the left flank area.  Since then the pain has gotten worse.  No diarrhea.  He did have some vomiting last night.  He is not sure if it was related to bad hamburger meat or this.  No position makes the pain better.  No fever.  He came to the ED this am but did not want to wait for tests because he had to go to work.  He has hx of kidney stones and this feels similar. Past Medical History  Diagnosis Date  . Seasonal allergies   . History of kidney stones     RIGHT SIDE 2007-  PASSED SPONTATEOUS  . Right ureteral stone   . Hypertension     CURRENTLY NO MEDS  . Frequency of urination   . Urgency of urination   . Hematuria    Past Surgical History  Procedure Laterality Date  . Closed reduction right wrist Right 1965  . Cystoscopy/retrograde/ureteroscopy Right 03/01/2013    Procedure: CYSTOSCOPY/RETROGRADE/URETEROSCOPY;  Surgeon: Molli Hazard, MD;  Location: [facility];  Service: Urology;  Laterality: Right;  CYSTOSCOPY ,RIGHT URETERAL STENT PLACEMENT, RIGHT RETROGRADE PYELOGRAM  . Circumcision  AGE 2  . Cystoscopy with retrograde pyelogram, ureteroscopy and stent placement Right 03/10/2013    Procedure: CYSTOSCOPY WITH RETROGRADE PYELOGRAM, URETEROSCOPY AND STENT PLACEMENT, left retrograde   " RIGHT STENT EXCHANGE AND POSSIBLE RIGHT RETROGRADE  PYELOGRAM";  Surgeon: Molli Hazard, MD;  Location: [facility];  Service: Urology;  Laterality: Right;  . Holmium laser application Right 34/19/6222    Procedure: HOLMIUM LASER APPLICATION;  Surgeon: Molli Hazard, MD;  Location: [facility];  Service: Urology;  Laterality: Right;   History reviewed. No  pertinent family history. History  Substance Use Topics  . Smoking status: Never Smoker   . Smokeless tobacco: Never Used  . Alcohol Use: No    Review of Systems    Allergies  Percocet and Vicodin  Home Medications   Prior to Admission medications   Medication Sig Start Date End Date Taking? Authorizing Provider  ibuprofen (ADVIL,MOTRIN) 200 MG tablet Take 800 mg by mouth every 6 (six) hours as needed (pain.).   Yes Historical Provider, MD  loratadine (CLARITIN) 10 MG tablet Take 10 mg by mouth daily as needed for allergies.   Yes Historical Provider, MD   BP 160/96  Pulse 106  Temp(Src) 98.1 F (36.7 C) (Oral)  Resp 18  SpO2 98% Physical Exam  Nursing note and vitals reviewed. Constitutional: He appears well-developed and well-nourished.  Appears uncomfortable  HENT:  Head: Normocephalic and atraumatic.  Right Ear: External ear normal.  Left Ear: External ear normal.  Eyes: Conjunctivae are normal. Right eye exhibits no discharge. Left eye exhibits no discharge. No scleral icterus.  Neck: Neck supple. No tracheal deviation present.  Cardiovascular: Normal rate, regular rhythm and intact distal pulses.   Pulmonary/Chest: Effort normal and breath sounds normal. No stridor. No respiratory distress. He has no wheezes. He has no rales.  Abdominal: Soft. Bowel sounds are normal. He exhibits no distension. There is no tenderness. There is CVA tenderness (left). There is no rebound and  no guarding.  Musculoskeletal: He exhibits no edema and no tenderness.  Neurological: He is alert. He has normal strength. No cranial nerve deficit (no facial droop, extraocular movements intact, no slurred speech) or sensory deficit. He exhibits normal muscle tone. He displays no seizure activity. Coordination normal.  Skin: Skin is warm and dry. No rash noted.  Psychiatric: He has a normal mood and affect.    ED Course  Procedures (including critical care time) Labs Review Labs Reviewed   CBC WITH DIFFERENTIAL - Abnormal; Notable for the following:    WBC 12.8 (*)    Neutro Abs 9.6 (*)    Monocytes Absolute 1.4 (*)    All other components within normal limits  COMPREHENSIVE METABOLIC PANEL - Abnormal; Notable for the following:    Glucose, Bld 128 (*)    Creatinine, Ser 1.44 (*)    GFR calc non Af Amer 50 (*)    GFR calc Af Amer 57 (*)    All other components within normal limits  URINE CULTURE  LIPASE, BLOOD  URINALYSIS, ROUTINE W REFLEX MICROSCOPIC    Imaging Review Ct Abdomen Pelvis Wo Contrast  02/12/2014   CLINICAL DATA:  Left flank pain.  History of nephrolithiasis.  EXAM: CT ABDOMEN AND PELVIS WITHOUT CONTRAST  TECHNIQUE: Multidetector CT imaging of the abdomen and pelvis was performed following the standard protocol without IV contrast.  COMPARISON:  03/01/2013.  FINDINGS: 8 mm calculus at the left ureteropelvic junction with associated mild dilatation of the left renal collecting system and left perinephric soft tissue stranding. 4 mm calculus in the left renal pelvis. 6 mm calculus in the upper pole of the left kidney. No ureteral, right kidney or bladder calculi are seen. Normal-sized prostate gland.  Unremarkable non contrasted appearance of the liver, spleen, pancreas, gallbladder and adrenal glands. Sigmoid colon diverticula. No enlarged lymph nodes. Normal appearing appendix. Small left inguinal hernia containing fat. Minimal scarring in the lingula. Mild lumbar and lower thoracic spine degenerative changes and more pronounced degenerative changes at the L5-S1 level.  IMPRESSION: 1. 8 mm arm left UPJ calculus causing mild left hydronephrosis with perinephric soft tissue stranding. 2. 4 mm left nonobstructing renal pelvis calculus. 3. 6 mm nonobstructing upper pole left renal calculus. 4. Small left inguinal hernia containing fat. 5. Sigmoid diverticulosis.   Electronically Signed   By: Enrique Sack M.D.   On: 02/12/2014 13:09   Medications  0.9 %  sodium chloride  infusion (1,000 mLs Intravenous New Bag/Given 02/12/14 1211)    Followed by  0.9 %  sodium chloride infusion (not administered)  ondansetron (ZOFRAN) injection 4 mg (4 mg Intravenous Given 02/12/14 1212)  HYDROmorphone (DILAUDID) injection 1 mg (1 mg Intravenous Given 02/12/14 1323)  ondansetron (ZOFRAN) injection 4 mg (4 mg Intravenous Given 02/12/14 1342)     MDM   Final diagnoses:  Ureteral stone with hydronephrosis    The patient's CT scan shows an 8 mm left UPJ stone causing hydronephrosis. The patient's pain persists despite Dilaudid and Zofran for nausea.  I consult with Dr. Wynetta Emery who is on-call for urology. He'll come to evaluate the patient in the ED for possible further treatment    Dorie Rank, MD 02/12/14 1421

## 2014-02-12 NOTE — ED Notes (Addendum)
Pt reports he was here last night for hemauria, feels like kidney stone. Left before labwork could be done. Was given IM pain medication, but pain returned, so pt returned for re evaluation. Hx of lithotripsy.

## 2014-02-12 NOTE — Op Note (Signed)
Preoperative diagnosis: Left ureteral calculus  Postoperative diagnosis: Left ureteral calculus  Procedure:  1. Cystoscopy 2. Left ureteral stent placement (6Fr x 24 cm) 3. Left retrograde pyelography with interpretation  Surgeon: Dr. Rana Snare  Resident: Milon Score, MD  Anesthesia: General  Complications: None  Intraoperative findings: Left retrograde pyelography demonstrated mild hydronephrosis. Stone was visible on scout film  EBL: Minimal  Specimens: None  Indication: Daniel Holland is a 63 y.o. male patient with urolithiasis. Pain and nauseas was uncontrolled in the ER. After reviewing the management options for treatment, they elected to proceed with the above surgical procedure(s). We have discussed the potential benefits and risks of the procedure, side effects of the proposed treatment, the likelihood of the patient achieving the goals of the procedure, and any potential problems that might occur during the procedure or recuperation. Informed consent has been obtained.  Description of procedure:  The patient was taken to the operating room and general anesthesia was induced.  The patient was placed in the dorsal lithotomy position, prepped and draped in the usual sterile fashion, and preoperative antibiotics were administered. A preoperative time-out was performed.   Cystourethroscopy was performed.  The patient's urethra was examined and was normal. The bladder was then systematically examined in its entirety. There was no evidence for any bladder tumors, stones, or other mucosal pathology.    Attention then turned to the Left ureteral orifice and a ureteral catheter was used to intubate the ureteral orifice.  Omnipaque contrast was injected through the ureteral catheter and a retrograde pyelogram was performed with findings as dictated above.  The guidewire was replaced and a ureteral stent was advance over the wire using Seldinger technique.  The stent was  positioned appropriately under fluoroscopic and cystoscopic guidance.  The wire was then removed with an adequate stent curl noted in the renal pelvis as well as in the bladder.  The bladder was then emptied and the procedure ended.  The patient appeared to tolerate the procedure well and without complications.  The patient was able to be awakened and transferred to the recovery unit in satisfactory condition.

## 2014-02-13 NOTE — ED Provider Notes (Signed)
Medical screening examination/treatment/procedure(s) were performed by non-physician practitioner and as supervising physician I was immediately available for consultation/collaboration.   EKG Interpretation None        Eben Choinski, MD 02/13/14 0621 

## 2014-02-14 ENCOUNTER — Encounter (HOSPITAL_COMMUNITY): Payer: Self-pay | Admitting: Urology

## 2014-02-17 LAB — URINE CULTURE: Colony Count: 50000

## 2014-02-18 ENCOUNTER — Other Ambulatory Visit: Payer: Self-pay | Admitting: Urology

## 2014-02-21 ENCOUNTER — Encounter (HOSPITAL_COMMUNITY): Payer: Self-pay | Admitting: Pharmacy Technician

## 2014-02-24 HISTORY — PX: LITHOTRIPSY: SUR834

## 2014-03-01 ENCOUNTER — Encounter (HOSPITAL_COMMUNITY): Payer: Self-pay | Admitting: *Deleted

## 2014-03-01 NOTE — Progress Notes (Signed)
Messg. Left for Selita by this nurse re patient and no ride home.

## 2014-03-01 NOTE — Progress Notes (Signed)
While interviewing patient on phone. Patient states he will call for cab home has no one to give him a ride home or stay with him at home post procedure. Informed him we cannot proceed with procedure without driver and caregiver at home. Patient states he will have to reschedule. Instructed him to call Dr. Cy Blamer office today.

## 2014-03-09 ENCOUNTER — Encounter (HOSPITAL_COMMUNITY): Payer: Self-pay | Admitting: *Deleted

## 2014-03-14 ENCOUNTER — Encounter (HOSPITAL_COMMUNITY): Payer: Self-pay | Admitting: *Deleted

## 2014-03-14 ENCOUNTER — Encounter (HOSPITAL_COMMUNITY)
Admission: RE | Disposition: A | Discharge status: Home / Self Care | Payer: Self-pay | Source: Ambulatory Visit | Attending: Urology

## 2014-03-14 ENCOUNTER — Ambulatory Visit (HOSPITAL_COMMUNITY): Payer: 59

## 2014-03-14 ENCOUNTER — Ambulatory Visit (HOSPITAL_COMMUNITY)
Admission: RE | Admit: 2014-03-14 | Discharge: 2014-03-14 | Disposition: A | Discharge status: Home / Self Care | Payer: 59 | Source: Ambulatory Visit | Attending: Urology | Admitting: Urology

## 2014-03-14 DIAGNOSIS — N201 Calculus of ureter: Secondary | ICD-10-CM | POA: Insufficient documentation

## 2014-03-14 DIAGNOSIS — Z885 Allergy status to narcotic agent status: Secondary | ICD-10-CM | POA: Diagnosis not present

## 2014-03-14 DIAGNOSIS — N200 Calculus of kidney: Secondary | ICD-10-CM

## 2014-03-14 HISTORY — DX: Chronic kidney disease, unspecified: N18.9

## 2014-03-14 SURGERY — LITHOTRIPSY, ESWL
Anesthesia: LOCAL | Laterality: Left

## 2014-03-14 MED ORDER — HYDROCODONE-ACETAMINOPHEN 5-325 MG PO TABS: 1.0000 | ORAL_TABLET | Freq: Four times a day (QID) | ORAL | Status: DC | PRN

## 2014-03-14 MED ORDER — DIAZEPAM 5 MG PO TABS
10.0000 mg | ORAL_TABLET | ORAL | Status: AC
  Administered 2014-03-14: 10 mg via ORAL
  Filled 2014-03-14: qty 2

## 2014-03-14 MED ORDER — DIPHENHYDRAMINE HCL 25 MG PO CAPS
25.0000 mg | ORAL_CAPSULE | ORAL | Status: AC
  Administered 2014-03-14: 25 mg via ORAL
  Filled 2014-03-14: qty 1

## 2014-03-14 MED ORDER — DEXTROSE-NACL 5-0.45 % IV SOLN
INTRAVENOUS | Status: DC
  Administered 2014-03-14: 09:00:00 via INTRAVENOUS

## 2014-03-14 MED ORDER — CIPROFLOXACIN HCL 500 MG PO TABS
500.0000 mg | ORAL_TABLET | ORAL | Status: AC
  Administered 2014-03-14: 500 mg via ORAL
  Filled 2014-03-14: qty 1

## 2014-03-14 NOTE — Discharge Instructions (Signed)
See [facility] discharge instructions in chart.  

## 2014-03-14 NOTE — Op Note (Signed)
See [facility] note scanned into chart. 

## 2014-03-14 NOTE — Interval H&P Note (Signed)
History and Physical Interval Note:  03/14/2014 9:48 AM  Daniel Holland  has presented today for surgery, with the diagnosis of Left Ureteral Calculus, Renal Calculi  The various methods of treatment have been discussed with the patient and family. After consideration of risks, benefits and other options for treatment, the patient has consented to  Procedure(s): LEFT EXTRACORPOREAL SHOCK WAVE LITHOTRIPSY (ESWL) (Left) as a surgical intervention .  The patient's history has been reviewed, patient examined, no change in status, stable for surgery.  I have reviewed the patient's chart and labs.  Questions were answered to the patient's satisfaction.     [name] S

## 2014-03-14 NOTE — H&P (Signed)
History of Present Illness   Mr Daniel Holland presents today to discuss ongoing management of his known nephrolithiasis. Mr Daniel Holland is currently 63 years of age. He has had approximately half a dozen stone events in his life. Initially, he passed stones spontaneously, but recently has required intervention. He was under the care of Dr Jasmine December and last year patient required stent placement followed by ureteroscopy. He presented recently when I was on-call with left flank pain, was diagnosed with a left proximal ureteral stone. The patient's pain was unable to be adequately managed. A stone was approximately 8 mm at the left ureteropelvic junction and there was not hydronephrosis. There were 2 additional left renal calculi. No right-sided stones were noted. The patient underwent fairly urgent stent placement as an outpatient and has done well since then.  He is having mild symptoms attributable to stent, but is otherwise is doing well. On KUB today, the stent appears to be well positioned. The stone that was in his ureteropelvic junction has now been pushed back to the kidney and the patient currently has 3 stones within the left kidney. These measure approximately 8, 6, and 5 mm in size.      Past Medical History Problems  1. History of Gastric ulcer (531.90) 2. History of hypertension (V12.59)  Surgical History Problems  1. History of Cystoscopy With Insertion Of Ureteral Stent Left 2. History of Cystoscopy With Insertion Of Ureteral Stent Right 3. History of Cystoscopy With Ureteroscopy With Lithotripsy 4. History of Cystoscopy With Ureteroscopy With Removal Of Calculus 5. History of Elective Circumcision  Allergies Medication  1. Percocet TABS 2. Vicodin TABS  Family History Problems  1. Family history of No Significant Family History : Mother  Social History Problems  1. Denied: History of Alcohol Use 2. Caffeine Use   32 oz per day 3. Marital History - Divorced (V61.03) 4. Never  smoker 5. Occupation:   Freight forwarder  Review of Systems Genitourinary, constitutional, skin, eye, otolaryngeal, hematologic/lymphatic, cardiovascular, pulmonary, endocrine, musculoskeletal, gastrointestinal, neurological and psychiatric system(s) were reviewed and pertinent findings if present are noted.  Genitourinary: urinary frequency and hematuria.    Vitals Vital Signs [Data Includes: Last 1 Day]  Recorded: 24Sep2015 08:39AM  Height: 5 ft 9 in Weight: 194 lb  BMI Calculated: 28.65 BSA Calculated: 2.04 Blood Pressure: 163 / 92 Temperature: 97.7 F Heart Rate: 67  Physical Exam Constitutional: Well nourished and well developed . No acute distress.  Pulmonary: No respiratory distress and normal respiratory rhythm and effort.  Cardiovascular: Heart rate and rhythm are normal . No peripheral edema.  Genitourinary: Examination of the penis demonstrates no discharge, no masses, no lesions and a normal meatus. The scrotum is without lesions. The right epididymis is palpably normal and non-tender. The left epididymis is palpably normal and non-tender. The right testis is non-tender and without masses. The left testis is non-tender and without masses.    Results/Data Urine [Data Includes: Last 1 Day]   24Sep2015  COLOR BROWN   APPEARANCE TURBID   SPECIFIC GRAVITY 1.020   pH 5.5   GLUCOSE NEG mg/dL  BILIRUBIN SMALL   KETONE NEG mg/dL  BLOOD LARGE   PROTEIN 30 mg/dL  UROBILINOGEN 0.2 mg/dL  NITRITE NEG   LEUKOCYTE ESTERASE TRACE   SQUAMOUS EPITHELIAL/HPF NONE SEEN   WBC 0-2 WBC/hpf  RBC TNTC RBC/hpf  BACTERIA RARE   CRYSTALS NONE SEEN   CASTS NONE SEEN   Other AMORPHOUS NOTED    Assessment Assessed  1. Nephrolithiasis (592.0)  Plan Health Maintenance  1. UA With REFLEX; [Do Not Release]; Status:Complete;   Done: [id number] 08:10AM Nephrolithiasis  2. Follow-up Schedule Surgery Office  Follow-up  Status: Hold For - Appointment   Requested for:  24Sep2015  Discussion/Summary   Mr Daniel Holland has 3 stones in his left kidney. Stent appears to be well positioned. 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 It was a mixture of monohydrated and dihydrate. Stones do not appear to be markedly dense. I do think he is a good candidate for ESWL. Hopefully, we can treat 2-3 of these stones. We will plan on trying to get him on the schedule for sometime in the next 1-2 weeks followed by a cystoscopy and stent removal in the office about 1 week status post his lithotripsy. Fortunately, he is tolerating the stent well. We talked about general stone prevention strategy and he may be interested in metabolic evaluation, so I did fill out a request for Litholink 48-hour urine once the acute stone event is over if he wants to pursue that.   Signatures Electronically signed by : Rana Snare, M.D.; Feb 18 2014 12:36PM EST

## 2014-03-14 NOTE — Progress Notes (Signed)
Called Dr. Risa Grill - notified that pt has allergy to prescribed Vicodin. Dr Risa Grill verbalized that pt may be discharged without prescriptions and does not need to continue taking the Ditropan or Flomax if asymptomatic.  If pt requires refills or further pain meds he is to call [facility] office.  Pt advised, verbalized understanding.  Coolidge Breeze, RN 03/14/2014

## 2014-04-12 ENCOUNTER — Emergency Department (HOSPITAL_COMMUNITY)
Admission: EM | Admit: 2014-04-12 | Discharge: 2014-04-12 | Disposition: A | Discharge status: Home / Self Care | Payer: 59 | Attending: Emergency Medicine | Admitting: Emergency Medicine

## 2014-04-12 ENCOUNTER — Encounter (HOSPITAL_COMMUNITY)
Admission: EM | Disposition: A | Discharge status: Home / Self Care | Payer: Self-pay | Source: Home / Self Care | Attending: Emergency Medicine

## 2014-04-12 ENCOUNTER — Encounter (HOSPITAL_COMMUNITY): Payer: Self-pay | Admitting: *Deleted

## 2014-04-12 DIAGNOSIS — Z79899 Other long term (current) drug therapy: Secondary | ICD-10-CM | POA: Insufficient documentation

## 2014-04-12 DIAGNOSIS — R1310 Dysphagia, unspecified: Secondary | ICD-10-CM | POA: Diagnosis not present

## 2014-04-12 DIAGNOSIS — K224 Dyskinesia of esophagus: Secondary | ICD-10-CM

## 2014-04-12 DIAGNOSIS — Z7951 Long term (current) use of inhaled steroids: Secondary | ICD-10-CM | POA: Diagnosis not present

## 2014-04-12 DIAGNOSIS — K2100 Gastro-esophageal reflux disease with esophagitis, without bleeding: Secondary | ICD-10-CM

## 2014-04-12 DIAGNOSIS — I10 Essential (primary) hypertension: Secondary | ICD-10-CM | POA: Diagnosis not present

## 2014-04-12 HISTORY — PX: ESOPHAGOGASTRODUODENOSCOPY: SHX5428

## 2014-04-12 LAB — CBC WITH DIFFERENTIAL/PLATELET
BASOS ABS: 0 10*3/uL (ref 0.0–0.1)
Basophils Relative: 0 % (ref 0–1)
Eosinophils Absolute: 0 10*3/uL (ref 0.0–0.7)
Eosinophils Relative: 0 % (ref 0–5)
HCT: 49.5 % (ref 39.0–52.0)
HEMOGLOBIN: 17.9 g/dL — AB (ref 13.0–17.0)
Lymphocytes Relative: 16 % (ref 12–46)
Lymphs Abs: 1.6 10*3/uL (ref 0.7–4.0)
MCH: 31.3 pg (ref 26.0–34.0)
MCHC: 36.2 g/dL — ABNORMAL HIGH (ref 30.0–36.0)
MCV: 86.7 fL (ref 78.0–100.0)
MONOS PCT: 13 % — AB (ref 3–12)
Monocytes Absolute: 1.3 10*3/uL — ABNORMAL HIGH (ref 0.1–1.0)
NEUTROS ABS: 7.2 10*3/uL (ref 1.7–7.7)
NEUTROS PCT: 71 % (ref 43–77)
Platelets: 261 10*3/uL (ref 150–400)
RBC: 5.71 MIL/uL (ref 4.22–5.81)
RDW: 12.8 % (ref 11.5–15.5)
WBC: 10.2 10*3/uL (ref 4.0–10.5)

## 2014-04-12 LAB — COMPREHENSIVE METABOLIC PANEL
ALK PHOS: 82 U/L (ref 39–117)
ALT: 29 U/L (ref 0–53)
ANION GAP: 16 — AB (ref 5–15)
AST: 29 U/L (ref 0–37)
Albumin: 4.7 g/dL (ref 3.5–5.2)
BUN: 20 mg/dL (ref 6–23)
CALCIUM: 9.9 mg/dL (ref 8.4–10.5)
CO2: 23 mEq/L (ref 19–32)
Chloride: 99 mEq/L (ref 96–112)
Creatinine, Ser: 1 mg/dL (ref 0.50–1.35)
GFR calc Af Amer: 89 mL/min — ABNORMAL LOW (ref >90)
GFR, EST NON AFRICAN AMERICAN: 77 mL/min — AB (ref >90)
GLUCOSE: 144 mg/dL — AB (ref 70–99)
Potassium: 4 mEq/L (ref 3.7–5.3)
Sodium: 138 mEq/L (ref 137–147)
TOTAL PROTEIN: 8.4 g/dL — AB (ref 6.0–8.3)
Total Bilirubin: 1 mg/dL (ref 0.3–1.2)

## 2014-04-12 SURGERY — EGD (ESOPHAGOGASTRODUODENOSCOPY)
Anesthesia: Moderate Sedation

## 2014-04-12 MED ORDER — MIDAZOLAM HCL 10 MG/2ML IJ SOLN
INTRAMUSCULAR | Status: AC
  Filled 2014-04-12: qty 4

## 2014-04-12 MED ORDER — FENTANYL CITRATE 0.05 MG/ML IJ SOLN
INTRAMUSCULAR | Status: AC
  Filled 2014-04-12: qty 4

## 2014-04-12 MED ORDER — GI COCKTAIL ~~LOC~~
30.0000 mL | Freq: Once | ORAL | Status: AC
  Administered 2014-04-12: 30 mL via ORAL
  Filled 2014-04-12: qty 30

## 2014-04-12 MED ORDER — SODIUM CHLORIDE 0.9 % IV BOLUS (SEPSIS)
1000.0000 mL | Freq: Once | INTRAVENOUS | Status: AC
  Administered 2014-04-12: 1000 mL via INTRAVENOUS

## 2014-04-12 MED ORDER — SODIUM CHLORIDE 0.9 % IV SOLN: INTRAVENOUS | Status: DC

## 2014-04-12 MED ORDER — PANTOPRAZOLE SODIUM 40 MG IV SOLR
40.0000 mg | Freq: Once | INTRAVENOUS | Status: AC
  Administered 2014-04-12: 40 mg via INTRAVENOUS
  Filled 2014-04-12: qty 40

## 2014-04-12 MED ORDER — FENTANYL CITRATE 0.05 MG/ML IJ SOLN
INTRAMUSCULAR | Status: DC | PRN
  Administered 2014-04-12 (×3): 25 ug via INTRAVENOUS

## 2014-04-12 MED ORDER — MIDAZOLAM HCL 10 MG/2ML IJ SOLN
INTRAMUSCULAR | Status: DC | PRN
  Administered 2014-04-12: 2 mg via INTRAVENOUS
  Administered 2014-04-12: 1 mg via INTRAVENOUS
  Administered 2014-04-12: 2 mg via INTRAVENOUS

## 2014-04-12 MED ORDER — PANTOPRAZOLE SODIUM 40 MG PO TBEC: 40.0000 mg | DELAYED_RELEASE_TABLET | Freq: Every day | ORAL | Status: DC

## 2014-04-12 MED ORDER — SODIUM CHLORIDE 0.9 % IV SOLN
INTRAVENOUS | Status: DC
  Administered 2014-04-12: 500 mL via INTRAVENOUS

## 2014-04-12 MED ORDER — BUTAMBEN-TETRACAINE-BENZOCAINE 2-2-14 % EX AERO
INHALATION_SPRAY | CUTANEOUS | Status: DC | PRN
  Administered 2014-04-12 (×2): 1 via TOPICAL

## 2014-04-12 MED ORDER — GLUCAGON HCL RDNA (DIAGNOSTIC) 1 MG IJ SOLR
1.0000 mg | Freq: Once | INTRAMUSCULAR | Status: AC
  Administered 2014-04-12: 1 mg via INTRAVENOUS
  Filled 2014-04-12: qty 1

## 2014-04-12 MED ORDER — DIPHENHYDRAMINE HCL 50 MG/ML IJ SOLN
INTRAMUSCULAR | Status: AC
  Filled 2014-04-12: qty 1

## 2014-04-12 MED ORDER — DIAZEPAM 5 MG/ML IJ SOLN
5.0000 mg | Freq: Once | INTRAMUSCULAR | Status: AC
  Administered 2014-04-12: 5 mg via INTRAVENOUS
  Filled 2014-04-12: qty 2

## 2014-04-12 NOTE — Op Note (Signed)
[facility] [place], [address]   ENDOSCOPY PROCEDURE REPORT  PATIENT: Daniel Holland, Daniel Holland  MR#: [id number] BIRTHDATE: [birth year] , 63  yrs. old GENDER: male ENDOSCOPIST:Yuvaan Olander Benson Norway, MD REFERRED BY: PROCEDURE DATE:  04/16/14 PROCEDURE:   EGD, diagnostic ASA CLASS:    Class II INDICATIONS: Acute dysphagia. MEDICATION: Versed 5 mg IV and Fentanyl 75 mcg IV TOPICAL ANESTHETIC:   Cetacaine Spray  DESCRIPTION OF PROCEDURE:   After the risks and benefits of the procedure were explained, informed consent was obtained.  The endoscope was introduced through the mouth  and advanced to the second portion of the duodenum .  The instrument was slowly withdrawn as the mucosa was fully examined.   FINDINGS:  Intubation of the esophagus was difficult. I do not know if this was secondadry to resistance by the patient versus an acute spasm of the UES.  The esophagus and gastroesophageal junction were completely normal in appearance.  The stomach was entered and closely examined.The antrum, angularis, and lesser curvature were well visualized, including a retroflexed view of the cardia and fundus.  The stomach wall was normally distensable.  The scope passed easily through the pylorus into the duodenum.    Retroflexed views revealed no abnormalities.    The scope was then withdrawn from the patient and the procedure completed.  COMPLICATIONS: There were no immediate complications.  ENDOSCOPIC IMPRESSION: 1) Normal EGD. 2) ? Acute spasm of the UES versus resistance by the patient.  RECOMMENDATIONS: 1) Trial of liquids.  If the patient fails then he requires admission and an ENT consultation. _______________________________ eSignedCarol Ada, MD 2014/04/16 3:35 PM    cc: CPT CODES: ICD CODES:  The ICD and CPT codes recommended by this software are interpretations from the data that the clinical staff has captured with the software.  The  verification of the translation of this report to the ICD and CPT codes and modifiers is the sole responsibility of the health care institution and practicing physician where this report was generated.  [employer] will not be held responsible for the validity of the ICD and CPT codes included on this report.  AMA assumes no liability for data contained or not contained herein. CPT is a Designer, television/film set of the [employer].

## 2014-04-12 NOTE — ED Provider Notes (Signed)
CSN: [id number]     Arrival date & time 04/12/14  1022 History   First MD Initiated Contact with Patient 04/12/14 1126     Chief Complaint  Patient presents with  . dysphagia      (Consider location/radiation/quality/duration/timing/severity/associated sxs/prior Treatment) HPI Comments: Patient is a 63 year old male with past medical history of hypertension and kidney stones. He presents today with complaints of difficulty swallowing. He states that he got a pill stuck in his throat on Sunday. Since that time he has had difficulty eating or drinking. Everything he takes by mouth, he vomits. He is unable to even swallow his own saliva. He is unsure if he got a piece of food stuck in his throat. He denies any pain. He denies any difficulty breathing.  The history is provided by the patient.    Past Medical History  Diagnosis Date  . Seasonal allergies   . History of kidney stones     RIGHT SIDE 2007-  PASSED SPONTATEOUS  . Right ureteral stone   . Hypertension     CURRENTLY NO MEDS  . Frequency of urination   . Urgency of urination   . Hematuria   . Chronic kidney disease     left ureteral stone   Past Surgical History  Procedure Laterality Date  . Closed reduction right wrist Right 1965  . Cystoscopy/retrograde/ureteroscopy Right 03/01/2013    Procedure: CYSTOSCOPY/RETROGRADE/URETEROSCOPY;  Surgeon: Molli Hazard, MD;  Location: [facility];  Service: Urology;  Laterality: Right;  CYSTOSCOPY ,RIGHT URETERAL STENT PLACEMENT, RIGHT RETROGRADE PYELOGRAM  . Circumcision  AGE 3  . Cystoscopy with retrograde pyelogram, ureteroscopy and stent placement Right 03/10/2013    Procedure: CYSTOSCOPY WITH RETROGRADE PYELOGRAM, URETEROSCOPY AND STENT PLACEMENT, left retrograde   " RIGHT STENT EXCHANGE AND POSSIBLE RIGHT RETROGRADE  PYELOGRAM";  Surgeon: Molli Hazard, MD;  Location: [facility];  Service: Urology;  Laterality: Right;  . Holmium laser application Right  30/94/0768    Procedure: HOLMIUM LASER APPLICATION;  Surgeon: Molli Hazard, MD;  Location: [facility];  Service: Urology;  Laterality: Right;  . Cystoscopy w/ ureteral stent placement Left 02/12/2014    Procedure: CYSTOSCOPY WITH RETROGRADE PYELOGRAM/URETERAL STENT PLACEMENT;  Surgeon: Bernestine Amass, MD;  Location: [facility];  Service: Urology;  Laterality: Left;   History reviewed. No pertinent family history. History  Substance Use Topics  . Smoking status: Never Smoker   . Smokeless tobacco: Never Used  . Alcohol Use: No    Review of Systems  All other systems reviewed and are negative.     Allergies  Percocet and Vicodin  Home Medications   Prior to Admission medications   Medication Sig Start Date End Date Taking? Authorizing Provider  loratadine (CLARITIN) 10 MG tablet Take 10 mg by mouth daily as needed for allergies.   Yes Historical Provider, MD  Pseudoephedrine-APAP-DM (DAYQUIL PO) Take 1 tablet by mouth once.   Yes Historical Provider, MD  Triamcinolone Acetonide (NASACORT AQ NA) Place 1 spray into the nose 2 (two) times daily.   Yes Historical Provider, MD  HYDROcodone-acetaminophen (NORCO/VICODIN) 5-325 MG per tablet Take 1-2 tablets by mouth every 6 (six) hours as needed. 03/14/14   Bernestine Amass, MD  oxybutynin (DITROPAN) 5 MG tablet Take 1 tablet (5 mg total) by mouth 3 (three) times daily. 02/12/14   Milon Score, MD  tamsulosin (FLOMAX) 0.4 MG CAPS capsule Take 1 capsule (0.4 mg total) by mouth at bedtime as needed (stent  pain/bladder spasms). 02/12/14   Milon Score, MD   BP 136/94 mmHg  Pulse 134  Temp(Src) 98.1 F (36.7 C) (Oral)  Resp 18  SpO2 100% Physical Exam  Constitutional: He is oriented to person, place, and time. He appears well-developed and well-nourished. No distress.  HENT:  Head: Normocephalic and atraumatic.  Mouth/Throat: Oropharynx is clear and moist.  Neck: Normal range of motion. Neck supple.   Cardiovascular: Normal rate, regular rhythm and normal heart sounds.   No murmur heard. Pulmonary/Chest: Effort normal and breath sounds normal. No respiratory distress. He has no wheezes.  Abdominal: Soft. Bowel sounds are normal. He exhibits no distension. There is no tenderness.  Musculoskeletal: Normal range of motion. He exhibits no edema.  Neurological: He is alert and oriented to person, place, and time.  Skin: Skin is warm and dry. He is not diaphoretic.  Nursing note and vitals reviewed.   ED Course  Procedures (including critical care time) Labs Review Labs Reviewed  COMPREHENSIVE METABOLIC PANEL  CBC WITH DIFFERENTIAL    Imaging Review No results found.   EKG Interpretation   Date/Time:  Tuesday April 12 2014 11:06:01 EST Ventricular Rate:  110 PR Interval:  135 QRS Duration: 89 QT Interval:  338 QTC Calculation: 457 R Axis:   80 Text Interpretation:  Sinus tachycardia Right atrial enlargement  Nonspecific repol abnormality, diffuse leads no significant change from  03/01/13 Confirmed by DELOS  MD, Mishaal Lansdale ([id number]) on 04/12/2014 11:27:17 AM      MDM   Final diagnoses:  None    Patient is a 63 year old male who presents with complaints of inability to swallow for the past 2 days. He states he has been unable to eat, drink, or handle his oral secretions. He has no history of acid reflux or any such similar symptoms. He was evaluated by Dr. Benson Norway and taken for an endoscopy. This revealed no obvious abnormalities. It was the recommendation of Dr. Benson Norway at the patient be evaluated by ENT. I've spoken with Dr. Wilburn Cornelia who will evaluate the patient in the ER. He is requested that the ENT cart and flexible register the at bedside.  Care will be signed out to the oncoming provider at shift change.    Veryl Speak, MD 04/12/14 [phone]

## 2014-04-12 NOTE — ED Notes (Signed)
ENT cart, rhinoscope and lightsource at bedside.

## 2014-04-12 NOTE — Discharge Instructions (Signed)
Gastroesophageal Reflux Disease, Adult Gastroesophageal reflux disease (GERD) happens when acid from your stomach flows up into the esophagus. When acid comes in contact with the esophagus, the acid causes soreness (inflammation) in the esophagus. Over time, GERD may create small holes (ulcers) in the lining of the esophagus. CAUSES   Increased body weight. This puts pressure on the stomach, making acid rise from the stomach into the esophagus.  Smoking. This increases acid production in the stomach.  Drinking alcohol. This causes decreased pressure in the lower esophageal sphincter (valve or ring of muscle between the esophagus and stomach), allowing acid from the stomach into the esophagus.  Late evening meals and a full stomach. This increases pressure and acid production in the stomach.  A malformed lower esophageal sphincter. Sometimes, no cause is found. SYMPTOMS   Burning pain in the lower part of the mid-chest behind the breastbone and in the mid-stomach area. This may occur twice a week or more often.  Trouble swallowing.  Sore throat.  Dry cough.  Asthma-like symptoms including chest tightness, shortness of breath, or wheezing. DIAGNOSIS  Your caregiver may be able to diagnose GERD based on your symptoms. In some cases, X-rays and other tests may be done to check for complications or to check the condition of your stomach and esophagus. TREATMENT  Your caregiver may recommend over-the-counter or prescription medicines to help decrease acid production. Ask your caregiver before starting or adding any new medicines.  HOME CARE INSTRUCTIONS   Change the factors that you can control. Ask your caregiver for guidance concerning weight loss, quitting smoking, and alcohol consumption.  Avoid foods and drinks that make your symptoms worse, such as:  Caffeine or alcoholic drinks.  Chocolate.  Peppermint or mint flavorings.  Garlic and onions.  Spicy foods.  Citrus fruits,  such as oranges, lemons, or limes.  Tomato-based foods such as sauce, chili, salsa, and pizza.  Fried and fatty foods.  Avoid lying down for the 3 hours prior to your bedtime or prior to taking a nap.  Eat small, frequent meals instead of large meals.  Wear loose-fitting clothing. Do not wear anything tight around your waist that causes pressure on your stomach.  Raise the head of your bed 6 to 8 inches with wood blocks to help you sleep. Extra pillows will not help.  Only take over-the-counter or prescription medicines for pain, discomfort, or fever as directed by your caregiver.  Do not take aspirin, ibuprofen, or other nonsteroidal anti-inflammatory drugs (NSAIDs). SEEK IMMEDIATE MEDICAL CARE IF:   You have pain in your arms, neck, jaw, teeth, or back.  Your pain increases or changes in intensity or duration.  You develop nausea, vomiting, or sweating (diaphoresis).  You develop shortness of breath, or you faint.  Your vomit is green, yellow, black, or looks like coffee grounds or blood.  Your stool is red, bloody, or black. These symptoms could be signs of other problems, such as heart disease, gastric bleeding, or esophageal bleeding. MAKE SURE YOU:   Understand these instructions.  Will watch your condition.  Will get help right away if you are not doing well or get worse. Document Released: 02/20/2005 Document Revised: 08/05/2011 Document Reviewed: 11/30/2010 [facility] Patient Information 2015 [place], [place]. This information is not intended to replace advice given to you by your health care provider. Make sure you discuss any questions you have with your health care provider.   Dysphagia Swallowing problems (dysphagia) occur when solids and liquids seem to stick in your  throat on the way down to your stomach, or the food takes longer to get to the stomach. Other symptoms include regurgitating food, noises coming from the throat, chest discomfort with swallowing, and  a feeling of fullness or the feeling of something being stuck in your throat when swallowing. When blockage in your throat is complete, it may be associated with drooling. CAUSES  Problems with swallowing may occur because of problems with the muscles. The food cannot be propelled in the usual manner into your stomach. You may have ulcers, scar tissue, or inflammation in the tube down which food travels from your mouth to your stomach (esophagus), which blocks food from passing normally into the stomach. Causes of inflammation include:  Acid reflux from your stomach into your esophagus.  Infection.  Radiation treatment for cancer.  Medicines taken without enough fluids to wash them down into your stomach. You may have nerve problems that prevent signals from being sent to the muscles of your esophagus to contract and move your food down to your stomach. Globus pharyngeus is a relatively common problem in which there is a sense of an obstruction or difficulty in swallowing, without any physical abnormalities of the swallowing passages being found. This problem usually improves over time with reassurance and testing to rule out other causes. DIAGNOSIS Dysphagia can be diagnosed and its cause can be determined by tests in which you swallow a white substance that helps illuminate the inside of your throat (contrast medium) while X-rays are taken. Sometimes a flexible telescope that is inserted down your throat (endoscopy) to look at your esophagus and stomach is used. TREATMENT   If the dysphagia is caused by acid reflux or infection, medicines may be used.  If the dysphagia is caused by problems with your swallowing muscles, swallowing therapy may be used to help you strengthen your swallowing muscles.  If the dysphagia is caused by a blockage or mass, procedures to remove the blockage may be done. HOME CARE INSTRUCTIONS  Try to eat soft food that is easier to swallow and check your weight on a  daily basis to be sure that it is not decreasing.  Be sure to drink liquids when sitting upright (not lying down). SEEK MEDICAL CARE IF:  You are losing weight because you are unable to swallow.  You are coughing when you drink liquids (aspiration).  You are coughing up partially digested food. SEEK IMMEDIATE MEDICAL CARE IF:  You are unable to swallow your own saliva .  You are having shortness of breath or a fever, or both.  You have a hoarse voice along with difficulty swallowing. MAKE SURE YOU:  Understand these instructions.  Will watch your condition.  Will get help right away if you are not doing well or get worse. Document Released: 05/10/2000 Document Revised: 09/27/2013 Document Reviewed: 10/30/2012 [facility] Patient Information 2015 [place], [place]. This information is not intended to replace advice given to you by your health care provider. Make sure you discuss any questions you have with your health care provider.

## 2014-04-12 NOTE — ED Notes (Signed)
ENT at bedside

## 2014-04-12 NOTE — ED Notes (Signed)
Pt returned from Endo. ?

## 2014-04-12 NOTE — ED Notes (Signed)
Pt went to ENDO.

## 2014-04-12 NOTE — Consult Note (Signed)
Unassigned Consult  Reason for Consult: Dysphagia. Referring Physician: ER  Francoise Schaumann HPI: This is a 63 year old male with an acute onset of dysphagia Sunday morning. He reports that he was not feeling well at that time and he went home from work.  He swallowed one of his medications and then he started to have issues with dysphagia.  His dysphagia continued to progress to the point that has not been able to tolerate any liquids.  Currently he is not able to tolerate any of his oral secretions.  No reports of odynophagia or GERD symptoms.  He also denies any evidence of hematochezia or melena and his HGB is in the 17 range.  No issues with tobacco abuse.  Past Medical History  Diagnosis Date  . Seasonal allergies   . History of kidney stones     RIGHT SIDE 2007-  PASSED SPONTATEOUS  . Right ureteral stone   . Hypertension     CURRENTLY NO MEDS  . Frequency of urination   . Urgency of urination   . Hematuria   . Chronic kidney disease     left ureteral stone    Past Surgical History  Procedure Laterality Date  . Closed reduction right wrist Right 1965  . Cystoscopy/retrograde/ureteroscopy Right 03/01/2013    Procedure: CYSTOSCOPY/RETROGRADE/URETEROSCOPY;  Surgeon: Molli Hazard, MD;  Location: [facility];  Service: Urology;  Laterality: Right;  CYSTOSCOPY ,RIGHT URETERAL STENT PLACEMENT, RIGHT RETROGRADE PYELOGRAM  . Circumcision  AGE 34  . Cystoscopy with retrograde pyelogram, ureteroscopy and stent placement Right 03/10/2013    Procedure: CYSTOSCOPY WITH RETROGRADE PYELOGRAM, URETEROSCOPY AND STENT PLACEMENT, left retrograde   " RIGHT STENT EXCHANGE AND POSSIBLE RIGHT RETROGRADE  PYELOGRAM";  Surgeon: Molli Hazard, MD;  Location: [facility];  Service: Urology;  Laterality: Right;  . Holmium laser application Right 62/95/2841    Procedure: HOLMIUM LASER APPLICATION;  Surgeon: Molli Hazard, MD;  Location: [facility];  Service:  Urology;  Laterality: Right;  . Cystoscopy w/ ureteral stent placement Left 02/12/2014    Procedure: CYSTOSCOPY WITH RETROGRADE PYELOGRAM/URETERAL STENT PLACEMENT;  Surgeon: Bernestine Amass, MD;  Location: [facility];  Service: Urology;  Laterality: Left;    History reviewed. No pertinent family history.  Social History:  reports that he has never smoked. He has never used smokeless tobacco. He reports that he does not drink alcohol or use illicit drugs.  Allergies:  Allergies  Allergen Reactions  . Percocet [Oxycodone-Acetaminophen] Nausea And Vomiting  . Vicodin [Hydrocodone-Acetaminophen] Nausea And Vomiting    Medications: Scheduled: Continuous:  Results for orders placed or performed during the hospital encounter of 04/12/14 (from the past 24 hour(s))  Comprehensive metabolic panel     Status: Abnormal   Collection Time: 04/12/14 11:54 AM  Result Value Ref Range   Sodium 138 137 - 147 mEq/L   Potassium 4.0 3.7 - 5.3 mEq/L   Chloride 99 96 - 112 mEq/L   CO2 23 19 - 32 mEq/L   Glucose, Bld 144 (H) 70 - 99 mg/dL   BUN 20 6 - 23 mg/dL   Creatinine, Ser 1.00 0.50 - 1.35 mg/dL   Calcium 9.9 8.4 - 10.5 mg/dL   Total Protein 8.4 (H) 6.0 - 8.3 g/dL   Albumin 4.7 3.5 - 5.2 g/dL   AST 29 0 - 37 U/L   ALT 29 0 - 53 U/L   Alkaline Phosphatase 82 39 - 117 U/L   Total Bilirubin 1.0 0.3 -  1.2 mg/dL   GFR calc non Af Amer 77 (L) >90 mL/min   GFR calc Af Amer 89 (L) >90 mL/min   Anion gap 16 (H) 5 - 15  CBC with Differential     Status: Abnormal   Collection Time: 04/12/14 11:54 AM  Result Value Ref Range   WBC 10.2 4.0 - 10.5 K/uL   RBC 5.71 4.22 - 5.81 MIL/uL   Hemoglobin 17.9 (H) 13.0 - 17.0 g/dL   HCT 49.5 39.0 - 52.0 %   MCV 86.7 78.0 - 100.0 fL   MCH 31.3 26.0 - 34.0 pg   MCHC 36.2 (H) 30.0 - 36.0 g/dL   RDW 12.8 11.5 - 15.5 %   Platelets 261 150 - 400 K/uL   Neutrophils Relative % 71 43 - 77 %   Neutro Abs 7.2 1.7 - 7.7 K/uL   Lymphocytes Relative 16 12 - 46 %   Lymphs Abs  1.6 0.7 - 4.0 K/uL   Monocytes Relative 13 (H) 3 - 12 %   Monocytes Absolute 1.3 (H) 0.1 - 1.0 K/uL   Eosinophils Relative 0 0 - 5 %   Eosinophils Absolute 0.0 0.0 - 0.7 K/uL   Basophils Relative 0 0 - 1 %   Basophils Absolute 0.0 0.0 - 0.1 K/uL     No results found.  ROS:  As stated above in the HPI otherwise negative.  Blood pressure 136/94, pulse 134, temperature 98.1 F (36.7 C), temperature source Oral, resp. rate 18, SpO2 100 %.    PE: Gen: NAD, but mildly uncomfortable appearing, Alert and Oriented HEENT:  /AT, EOMI Neck: Supple, no LAD Lungs: CTA Bilaterally CV: RRR without M/G/R ABM: Soft, NTND, +BS Ext: No C/C/E  Assessment/Plan: 1) Acute dysphagia.   The etiology is not clear and further evaluation with an EGD is required at this time.  Plan: 1) EGD now.  Tonia Avino D 04/12/2014, 1:49 PM

## 2014-04-12 NOTE — Consult Note (Signed)
ENT CONSULT:  Reason for Consult: Dysphagia Referring Physician: EDP  Daniel Holland is an 63 y.o. male.  HPI: Pt reports 2 day history of severe dysphagia after 12 hr episode of N/V on Sunday after acute illness and difficulty swallowing a pill. No prior hx, non-smoker. No respiratory difficulty or hoarseness. No Fever or infection  Past Medical History  Diagnosis Date  . Seasonal allergies   . History of kidney stones     RIGHT SIDE 2007-  PASSED SPONTATEOUS  . Right ureteral stone   . Hypertension     CURRENTLY NO MEDS  . Frequency of urination   . Urgency of urination   . Hematuria   . Chronic kidney disease     left ureteral stone    Past Surgical History  Procedure Laterality Date  . Closed reduction right wrist Right 1965  . Cystoscopy/retrograde/ureteroscopy Right 03/01/2013    Procedure: CYSTOSCOPY/RETROGRADE/URETEROSCOPY;  Surgeon: Molli Hazard, MD;  Location: [facility];  Service: Urology;  Laterality: Right;  CYSTOSCOPY ,RIGHT URETERAL STENT PLACEMENT, RIGHT RETROGRADE PYELOGRAM  . Circumcision  AGE 16  . Cystoscopy with retrograde pyelogram, ureteroscopy and stent placement Right 03/10/2013    Procedure: CYSTOSCOPY WITH RETROGRADE PYELOGRAM, URETEROSCOPY AND STENT PLACEMENT, left retrograde   " RIGHT STENT EXCHANGE AND POSSIBLE RIGHT RETROGRADE  PYELOGRAM";  Surgeon: Molli Hazard, MD;  Location: [facility];  Service: Urology;  Laterality: Right;  . Holmium laser application Right 27/51/7001    Procedure: HOLMIUM LASER APPLICATION;  Surgeon: Molli Hazard, MD;  Location: [facility];  Service: Urology;  Laterality: Right;  . Cystoscopy w/ ureteral stent placement Left 02/12/2014    Procedure: CYSTOSCOPY WITH RETROGRADE PYELOGRAM/URETERAL STENT PLACEMENT;  Surgeon: Bernestine Amass, MD;  Location: [facility];  Service: Urology;  Laterality: Left;    History reviewed. No pertinent family history.  Social History:  reports  that he has never smoked. He has never used smokeless tobacco. He reports that he does not drink alcohol or use illicit drugs.  Allergies:  Allergies  Allergen Reactions  . Percocet [Oxycodone-Acetaminophen] Nausea And Vomiting  . Vicodin [Hydrocodone-Acetaminophen] Nausea And Vomiting    Medications: I have reviewed the patient's current medications.  Results for orders placed or performed during the hospital encounter of 04/12/14 (from the past 48 hour(s))  Comprehensive metabolic panel     Status: Abnormal   Collection Time: 04/12/14 11:54 AM  Result Value Ref Range   Sodium 138 137 - 147 mEq/L   Potassium 4.0 3.7 - 5.3 mEq/L   Chloride 99 96 - 112 mEq/L   CO2 23 19 - 32 mEq/L   Glucose, Bld 144 (H) 70 - 99 mg/dL   BUN 20 6 - 23 mg/dL   Creatinine, Ser 1.00 0.50 - 1.35 mg/dL   Calcium 9.9 8.4 - 10.5 mg/dL   Total Protein 8.4 (H) 6.0 - 8.3 g/dL   Albumin 4.7 3.5 - 5.2 g/dL   AST 29 0 - 37 U/L   ALT 29 0 - 53 U/L   Alkaline Phosphatase 82 39 - 117 U/L   Total Bilirubin 1.0 0.3 - 1.2 mg/dL   GFR calc non Af Amer 77 (L) >90 mL/min   GFR calc Af Amer 89 (L) >90 mL/min    Comment: (NOTE) The eGFR has been calculated using the CKD EPI equation. This calculation has not been validated in all clinical situations. eGFR's persistently <90 mL/min signify possible Chronic Kidney Disease.    Anion gap  16 (H) 5 - 15  CBC with Differential     Status: Abnormal   Collection Time: 04/12/14 11:54 AM  Result Value Ref Range   WBC 10.2 4.0 - 10.5 K/uL   RBC 5.71 4.22 - 5.81 MIL/uL   Hemoglobin 17.9 (H) 13.0 - 17.0 g/dL   HCT 49.5 39.0 - 52.0 %   MCV 86.7 78.0 - 100.0 fL   MCH 31.3 26.0 - 34.0 pg   MCHC 36.2 (H) 30.0 - 36.0 g/dL   RDW 12.8 11.5 - 15.5 %   Platelets 261 150 - 400 K/uL   Neutrophils Relative % 71 43 - 77 %   Neutro Abs 7.2 1.7 - 7.7 K/uL   Lymphocytes Relative 16 12 - 46 %   Lymphs Abs 1.6 0.7 - 4.0 K/uL   Monocytes Relative 13 (H) 3 - 12 %   Monocytes Absolute 1.3  (H) 0.1 - 1.0 K/uL   Eosinophils Relative 0 0 - 5 %   Eosinophils Absolute 0.0 0.0 - 0.7 K/uL   Basophils Relative 0 0 - 1 %   Basophils Absolute 0.0 0.0 - 0.1 K/uL    No results found.  ROS:ROS neg for prior swallowing issues  Blood pressure 179/100, pulse 107, temperature 98.1 F (36.7 C), temperature source Oral, resp. rate 24, SpO2 93 %.  PHYSICAL EXAM: General appearance - alert, well appearing, and in no distress Nose - normal and patent, no erythema, discharge or polyps and septal deviation to left Mouth - mucous membranes moist, pharynx normal without lesions Neck - supple, no significant adenopathy Flexible Laryngoscopy - Normal NP, BOT and supraglottis. Nl VC mobility, no lesion. Hvy post-glottic erythema. No pooling of secretions or mass.  Studies Reviewed:Labs  Assessment/Plan: Pt s/p UGI today by Dr. Benson Norway - tight UES, no lesion. Etiology of acute dysphagia may be related to recent vomiting and assoc UES spasm. Rec reflux tx, PPI and hydration. Monitor sx's. F/U with ENT as outpt or consult as needed.  [place], Heela Heishman 04/12/2014, 6:02 PM

## 2014-04-12 NOTE — ED Notes (Signed)
Bed: WA03 Expected date:  Expected time:  Means of arrival:  Comments: Pt in endo

## 2014-04-12 NOTE — ED Notes (Signed)
Pt reports he got cold sweats and fever on Sunday,sweat soaked his clothes, pt went home took a dayquil tablet, pt thinks pill got stuck in his throat, pt vomited 2-3 times after that. Since Sunday pt has not been able to eat or drink. Pt will attempt to drink, but immediately pt has to spit fluids back up. Pt denies pain, denies difficulty breathing. Able to speak in full sentences.

## 2014-04-12 NOTE — ED Provider Notes (Signed)
4:33 PM Care transferred from Dr. Stark Jock, ENT to see. Pt unable to tolerate PO.     6:30 PM I spoken with Dr. Wilburn Cornelia with ENT as well as Dr. Collene Mares with GI, we are in agreement that as patient is currently unable to tolerate liquids he is to be admitted to the hospital. Dr. Collene Mares feels he likely has Acute spasm assoc acid reflux, IV PPI.  However, the patient is not agreeable to admission. He understands that he may get worse and may become more dehydrated which could lead to renal failure which complications include death. He still does not want to stay. I believe he has the capacity to make this decision. I will give him 40 mg of IV Protonix and a liter of normal saline prior to discharge. He agrees to return if symptoms should worsen and/or not resolve.  8:03 PM Pt's boss also unable to persuade him to stay. Will d/c home w/ PO protonix.   1. Dysphagia   2. Esophageal spasm   3. Gastroesophageal reflux disease with esophagitis      Ernestina Patches, MD 04/12/14 2004

## 2014-04-12 NOTE — ED Notes (Signed)
Bed: WA03 Expected date:  Expected time:  Means of arrival:  Comments: 

## 2014-04-13 ENCOUNTER — Encounter (HOSPITAL_COMMUNITY): Payer: Self-pay | Admitting: Gastroenterology

## 2014-05-02 ENCOUNTER — Other Ambulatory Visit: Payer: Self-pay | Admitting: Gastroenterology

## 2014-05-02 DIAGNOSIS — R1310 Dysphagia, unspecified: Secondary | ICD-10-CM

## 2014-05-26 ENCOUNTER — Other Ambulatory Visit (HOSPITAL_COMMUNITY): Payer: Self-pay | Admitting: Gastroenterology

## 2014-05-26 ENCOUNTER — Other Ambulatory Visit: Payer: Self-pay | Admitting: Gastroenterology

## 2014-05-26 DIAGNOSIS — R1310 Dysphagia, unspecified: Secondary | ICD-10-CM

## 2014-06-01 ENCOUNTER — Ambulatory Visit (HOSPITAL_COMMUNITY)
Admission: RE | Admit: 2014-06-01 | Discharge: 2014-06-01 | Disposition: A | Discharge status: Home / Self Care | Payer: 59 | Source: Ambulatory Visit | Attending: Gastroenterology | Admitting: Gastroenterology

## 2014-06-01 DIAGNOSIS — I129 Hypertensive chronic kidney disease with stage 1 through stage 4 chronic kidney disease, or unspecified chronic kidney disease: Secondary | ICD-10-CM | POA: Diagnosis not present

## 2014-06-01 DIAGNOSIS — N189 Chronic kidney disease, unspecified: Secondary | ICD-10-CM | POA: Diagnosis not present

## 2014-06-01 DIAGNOSIS — R1110 Vomiting, unspecified: Secondary | ICD-10-CM | POA: Insufficient documentation

## 2014-06-01 DIAGNOSIS — R1319 Other dysphagia: Secondary | ICD-10-CM | POA: Diagnosis not present

## 2014-06-01 DIAGNOSIS — R1310 Dysphagia, unspecified: Secondary | ICD-10-CM

## 2014-06-01 NOTE — Procedures (Signed)
Objective Swallowing Evaluation: Modified Barium Swallowing Study  Patient Details  Name: Daniel Holland MRN: [id number] Date of Birth: Aug 29, 1950  Today's Date: 06/01/2014 Time: 8032-1224 SLP Time Calculation (min) (ACUTE ONLY): 22 min  Past Medical History:  Past Medical History  Diagnosis Date  . Seasonal allergies   . History of kidney stones     RIGHT SIDE 2007-  PASSED SPONTATEOUS  . Right ureteral stone   . Hypertension     CURRENTLY NO MEDS  . Frequency of urination   . Urgency of urination   . Hematuria   . Chronic kidney disease     left ureteral stone   Past Surgical History:  Past Surgical History  Procedure Laterality Date  . Closed reduction right wrist Right 1965  . Cystoscopy/retrograde/ureteroscopy Right 03/01/2013    Procedure: CYSTOSCOPY/RETROGRADE/URETEROSCOPY;  Surgeon: Molli Hazard, MD;  Location: [facility];  Service: Urology;  Laterality: Right;  CYSTOSCOPY ,RIGHT URETERAL STENT PLACEMENT, RIGHT RETROGRADE PYELOGRAM  . Circumcision  AGE 40  . Cystoscopy with retrograde pyelogram, ureteroscopy and stent placement Right 03/10/2013    Procedure: CYSTOSCOPY WITH RETROGRADE PYELOGRAM, URETEROSCOPY AND STENT PLACEMENT, left retrograde   " RIGHT STENT EXCHANGE AND POSSIBLE RIGHT RETROGRADE  PYELOGRAM";  Surgeon: Molli Hazard, MD;  Location: [facility];  Service: Urology;  Laterality: Right;  . Holmium laser application Right 82/50/0370    Procedure: HOLMIUM LASER APPLICATION;  Surgeon: Molli Hazard, MD;  Location: [facility];  Service: Urology;  Laterality: Right;  . Cystoscopy w/ ureteral stent placement Left 02/12/2014    Procedure: CYSTOSCOPY WITH RETROGRADE PYELOGRAM/URETERAL STENT PLACEMENT;  Surgeon: Bernestine Amass, MD;  Location: [facility];  Service: Urology;  Laterality: Left;  . Esophagogastroduodenoscopy N/A 04/12/2014    Procedure: ESOPHAGOGASTRODUODENOSCOPY (EGD);  Surgeon: Beryle Beams, MD;  Location:  [facility];  Service: Endoscopy;  Laterality: N/A;   HPI:  64 yo male who presents with difficulty swallowing, which was precipitated by episodes of vomitting. He continues to eat what he wants, but will use liquids to wash the solids down.     Assessment / Plan / Recommendation Clinical Impression  Dysphagia Diagnosis: Moderate cervical esophageal phase dysphagia Clinical impression: Pt presents with a moderate cervical esophageal dysphagia characterized by reduced UES relaxation that leads to moderate residuals primarily at the valleculae and UES. Pt utilizes a liquid wash to clear residuals from pharynx, with flash penetration of thin liquids via straw. Pt appears to have adequate hyolaryngeal elevation and excursion to facilitate UES opening and generally adequate strength for pharyngeal clearance. Therefore, pt may benefit from additional f/u wtih GI to better determine source for decreased UES opening. Recommend to continue regular textures and thin liquids with use of alternating solids and liquids.    Treatment Recommendation  No treatment recommended at this time (recommend f/u with GI)    Diet Recommendation Regular;Thin liquid   Liquid Administration via: Cup Medication Administration: Whole meds with liquid Supervision: Patient able to self feed Compensations: Slow rate;Small sips/bites;Follow solids with liquid Postural Changes and/or Swallow Maneuvers: Seated upright 90 degrees;Upright 30-60 min after meal    Other  Recommendations Recommended Consults: Consider GI evaluation (per pt, he is already scheduled for f/u GI appointment) Oral Care Recommendations: Oral care BID   Follow Up Recommendations  None    Pertinent Vitals/Pain n/a    SLP Swallow Goals     General Date of Onset:  (November 2015, just before Thanksgiving) HPI: 64 yo male who presents  with difficulty swallowing, which was precipitated by episodes of vomitting. He continues to eat what he wants, but  will use liquids to wash the solids down. Type of Study: Modified Barium Swallowing Study Reason for Referral: Objectively evaluate swallowing function Previous Swallow Assessment: none in chart Diet Prior to this Study: Regular;Thin liquids Respiratory Status: Room air Behavior/Cognition: Alert;Cooperative;Pleasant mood Oral Cavity - Dentition: Adequate natural dentition Oral Motor / Sensory Function: Within functional limits Self-Feeding Abilities: Able to feed self Patient Positioning: Upright in chair Baseline Vocal Quality: Clear Volitional Cough: Strong Volitional Swallow: Able to elicit Anatomy: Within functional limits Pharyngeal Secretions: Not observed secondary MBS    Reason for Referral Objectively evaluate swallowing function   Oral Phase Oral Preparation/Oral Phase Oral Phase: WFL   Pharyngeal Phase Pharyngeal Phase Pharyngeal Phase: Impaired Pharyngeal - Thin Pharyngeal - Thin Cup: Pharyngeal residue - valleculae;Pharyngeal residue - cp segment Pharyngeal - Thin Straw: Pharyngeal residue - valleculae;Pharyngeal residue - cp segment;Penetration/Aspiration during swallow Penetration/Aspiration details (thin straw): Material enters airway, remains ABOVE vocal cords then ejected out Pharyngeal - Solids Pharyngeal - Puree: Pharyngeal residue - valleculae;Pharyngeal residue - cp segment Pharyngeal - Regular: Pharyngeal residue - valleculae;Pharyngeal residue - cp segment Pharyngeal - Pill: Within functional limits  Cervical Esophageal Phase    GO    Cervical Esophageal Phase Cervical Esophageal Phase: Impaired Cervical Esophageal Phase - Thin Thin Cup: Reduced cricopharyngeal relaxation Cervical Esophageal Phase - Solids Puree: Reduced cricopharyngeal relaxation Regular: Reduced cricopharyngeal relaxation    Functional Assessment Tool Used: skilled clinical judgment Functional Limitations: Swallowing Swallow Current Status (Y7741): At least 20 percent but  less than 40 percent impaired, limited or restricted Swallow Goal Status [phone]): At least 20 percent but less than 40 percent impaired, limited or restricted Swallow Discharge Status [phone]): At least 20 percent but less than 40 percent impaired, limited or restricted     Germain Osgood, M.A. CCC-SLP [phone]  Germain Osgood 06/01/2014, 1:40 PM

## 2014-07-07 ENCOUNTER — Encounter: Payer: Self-pay | Admitting: Neurology

## 2014-07-07 ENCOUNTER — Ambulatory Visit (INDEPENDENT_AMBULATORY_CARE_PROVIDER_SITE_OTHER): Payer: 59 | Admitting: Neurology

## 2014-07-07 VITALS — BP 157/98 | HR 84 | Ht 68.0 in | Wt 188.0 lb

## 2014-07-07 DIAGNOSIS — R1310 Dysphagia, unspecified: Secondary | ICD-10-CM

## 2014-07-07 DIAGNOSIS — R269 Unspecified abnormalities of gait and mobility: Secondary | ICD-10-CM | POA: Insufficient documentation

## 2014-07-07 DIAGNOSIS — R202 Paresthesia of skin: Secondary | ICD-10-CM

## 2014-07-07 NOTE — Progress Notes (Signed)
PATIENT: Daniel Holland DOB: [birth year]  HISTORICAL  Malakye Nolden is a 64 years old right-handed Caucasian male, referred by GI physician Dr. Benson Norway, and his primary care physician Dr. Jacelyn Grip for evaluation of acute onset dysphagia,  He had a history of hypertension, hyperlipidemia, physically active, works as a Lexicographer facility at [employer], in April 10 2014, he had flulike symptoms, after woke up from sleep, he felt nausea, has violent vomiting few times, then he noticed acute onset of difficulty swallowing, not able to swallow his saliva, unbalanced the sensation, he was able to go to work next 2 days, but could not eat anything, presented to emergency room April 12 2014,  Had EGD by Dr. Benson Norway, difficult to intubate esophagus, otherwise normal EGD, barium swallowing testing January 2016, showed moderate cervical esophageal phase dysphagia, reduced upper esophagus relaxation, leads to moderate residual, primarily at the valleculae and upper esophagus  Over the past few months, his symptoms overall has much improved, but not back to normal, few weeks since initial symptoms, he also noticed right ear, right upper extremity, and the leg paresthesia, no weakness,  Laboratory evaluation in November 2015, normal CMP, with exception of elevated hemoglobin 17.9, normal CMP with exception of elevated glucose 140   REVIEW OF SYSTEMS: Full 14 system review of systems performed and notable only for as above  ALLERGIES: Allergies  Allergen Reactions  . Percocet [Oxycodone-Acetaminophen] Nausea And Vomiting  . Vicodin [Hydrocodone-Acetaminophen] Nausea And Vomiting    HOME MEDICATIONS: Current Outpatient Prescriptions  Medication Sig Dispense Refill  . amLODipine (NORVASC) 5 MG tablet Take 5 mg by mouth daily.    Marland Kitchen atorvastatin (LIPITOR) 20 MG tablet Take 20 mg by mouth daily.    . Cyanocobalamin (B-12) 2500 MCG TABS Take 2,500 mg by mouth 1 day or 1 dose.    . loratadine  (CLARITIN) 10 MG tablet Take 10 mg by mouth daily as needed for allergies.    . pantoprazole (PROTONIX) 40 MG tablet Take 1 tablet (40 mg total) by mouth daily. 30 tablet 0  . Triamcinolone Acetonide (NASACORT AQ NA) Place 1 spray into the nose 2 (two) times daily.     No current facility-administered medications for this visit.  Intubation of the esophagus was difficult. I do not know if this was secondadry to resistance by the patient versus an acute spasm of the UES. The esophagus and gastroesophageal junction were completely normal in appearance. The stomach was entered and closely examined.The antrum, angularis, and lesser curvature were well visualized, including a retroflexed view of the cardia and fundus. The stomach wall was normally distensable. The scope passed easily through the pylorus into the duodenum. Retroflexed views revealed no abnormalities. The scope was then withdrawn from the patient and the procedure completed. COMPLICATIONS: There were no immediate complications. ENDOSCOPIC IMPRESSION: 1) Normal EGD. 2) ? Acute spasm of the UES versus resistance by the patient  PAST MEDICAL HISTORY: Past Medical History  Diagnosis Date  . Seasonal allergies   . History of kidney stones     RIGHT SIDE 2007-  PASSED SPONTATEOUS  . Right ureteral stone   . Hypertension     CURRENTLY NO MEDS  . Frequency of urination   . Urgency of urination   . Hematuria   . Chronic kidney disease     left ureteral stone  . Dysphagia   . Hyperlipemia     PAST SURGICAL HISTORY: Past Surgical History  Procedure Laterality Date  . Closed reduction  right wrist Right 1965  . Cystoscopy/retrograde/ureteroscopy Right 03/01/2013    Procedure: CYSTOSCOPY/RETROGRADE/URETEROSCOPY;  Surgeon: Molli Hazard, MD;  Location: [facility];  Service: Urology;  Laterality: Right;  CYSTOSCOPY ,RIGHT URETERAL STENT PLACEMENT, RIGHT RETROGRADE PYELOGRAM  . Circumcision  AGE 64  . Cystoscopy with retrograde  pyelogram, ureteroscopy and stent placement Right 03/10/2013    Procedure: CYSTOSCOPY WITH RETROGRADE PYELOGRAM, URETEROSCOPY AND STENT PLACEMENT, left retrograde   " RIGHT STENT EXCHANGE AND POSSIBLE RIGHT RETROGRADE  PYELOGRAM";  Surgeon: Molli Hazard, MD;  Location: [facility];  Service: Urology;  Laterality: Right;  . Holmium laser application Right 75/88/3254    Procedure: HOLMIUM LASER APPLICATION;  Surgeon: Molli Hazard, MD;  Location: [facility];  Service: Urology;  Laterality: Right;  . Cystoscopy w/ ureteral stent placement Left 02/12/2014    Procedure: CYSTOSCOPY WITH RETROGRADE PYELOGRAM/URETERAL STENT PLACEMENT;  Surgeon: Bernestine Amass, MD;  Location: [facility];  Service: Urology;  Laterality: Left;  . Esophagogastroduodenoscopy N/A 04/12/2014    Procedure: ESOPHAGOGASTRODUODENOSCOPY (EGD);  Surgeon: Beryle Beams, MD;  Location: [facility];  Service: Endoscopy;  Laterality: N/A;    FAMILY HISTORY: Family History  Problem Relation Age of Onset  . Thyroid disease Mother   . Alzheimer's disease Mother     SOCIAL HISTORY:  History   Social History  . Marital Status: Divorced    Spouse Name: N/A  . Number of Children: 0  . Years of Education: College   Occupational History  . Manager of athletic facility [employer]  . Smoking status: Never Smoker   . Smokeless tobacco: Never Used  . Alcohol Use: No  . Drug Use: No  . Sexual Activity: Not Currently   Other Topics Concern  . Not on file   Social History Narrative   Lives at home alone.   Right-handed.   Occasional caffeine use.   PHYSICAL EXAM   Filed Vitals:   07/07/14 0857  BP: 157/98  Pulse: 84  Height: 5\' 8"  (1.727 m)  Weight: 188 lb (85.276 kg)    Not recorded      Body mass index is 28.59 kg/(m^2).   Generalized: In no acute distress  Neck: Supple, no carotid bruits   Cardiac: Regular rate  rhythm  Pulmonary: Clear to auscultation bilaterally  Musculoskeletal: No deformity  Neurological examination  Mentation: Alert oriented to time, place, history taking, and causual conversation  Cranial nerve II-XII: Pupils were equal round reactive to light. Extraocular movements were full.  Visual field were full on confrontational test. Bilateral fundi were sharp.  Facial sensation and strength were normal. Hearing was intact to finger rubbing bilaterally. Uvula tongue midline.  Head turning and shoulder shrug and were normal and symmetric.Tongue protrusion into cheek strength was normal. Bilateral corneal reflexes were normal, lack of gag reflexes. Symmetric movement bilateral soft palate.    Motor: Normal tone, bulk and strength.  Sensory: Intact to fine touch, pinprick, preserved vibratory sensation, and proprioception at toes.  Coordination: Normal finger to nose, heel-to-shin bilaterally there was no truncal ataxia  Gait: Rising up from seated position without assistance, normal stance, without trunk ataxia, moderate stride, good arm swing, smooth turning, able to perform tiptoe, and heel walking without difficulty.   Romberg signs: Negative  Deep tendon reflexes: Brachioradialis 2/2, biceps 2/2, triceps 2/2, patellar 2/2, Achilles 2/2, plantar responses were flexor bilaterally.   DIAGNOSTIC DATA (LABS, IMAGING, TESTING) - I reviewed patient records, labs,  notes, testing and imaging myself where available.  Lab Results  Component Value Date   WBC 10.2 04/12/2014   HGB 17.9* 04/12/2014   HCT 49.5 04/12/2014   MCV 86.7 04/12/2014   PLT 261 04/12/2014      Component Value Date/Time   NA 138 04/12/2014 1154   K 4.0 04/12/2014 1154   CL 99 04/12/2014 1154   CO2 23 04/12/2014 1154   GLUCOSE 144* 04/12/2014 1154   BUN 20 04/12/2014 1154   CREATININE 1.00 04/12/2014 1154   CALCIUM 9.9 04/12/2014 1154   PROT 8.4* 04/12/2014 1154   ALBUMIN 4.7 04/12/2014 1154   AST 29  04/12/2014 1154   ALT 29 04/12/2014 1154   ALKPHOS 82 04/12/2014 1154   BILITOT 1.0 04/12/2014 1154   GFRNONAA 77* 04/12/2014 1154   GFRAA 89* 04/12/2014 1154   ASSESSMENT AND PLAN  Arash Karstens is a 64 y.o. male  With history of hypertension, hyperlipidemia, presented with acute onset of dysphagia in April 11 2015, right hemiparesthesia, unbalanced gait, slowly improved over past few months, following episodes of vomiting,   differentiation diagnosis includes brainstem stroke, from vestibular artery dissection, need to rule out neuromuscular junctional disorder MRI of brain, MRA of brain and neck Laboratory evaluation, including acetylcholine receptor antibody Return to clinic in 3 weeks Aspirin 81 mg daily  Orders Placed This Encounter  Procedures  . MR Brain Wo Contrast  . MR MRA Headm  . MR Angiogram Neck Wo Contrast  . Thyroid Panel With TSH  . CK  . C-reactive protein  . Sedimentation rate  . ANA w/Reflex if Positive  . Acetylcholine receptor, binding  . Acetylcholine receptor, modulating    New Prescriptions   No medications on file    Medications Discontinued During This Encounter  Medication Reason  . HYDROcodone-acetaminophen (NORCO/VICODIN) 5-325 MG per tablet Patient Preference  . oxybutynin (DITROPAN) 5 MG tablet Patient Preference  . Pseudoephedrine-APAP-DM (DAYQUIL PO) Patient Preference  . tamsulosin (FLOMAX) 0.4 MG CAPS capsule Patient Preference    Return in about 3 weeks (around 07/28/2014).  Marcial Pacas, M.D. Ph.D.  [facility] [address], [place], [place] [address] Ph: [phone] Fax: [phone]

## 2014-07-11 ENCOUNTER — Encounter (HOSPITAL_COMMUNITY): Admission: RE | Payer: Self-pay | Source: Ambulatory Visit

## 2014-07-11 ENCOUNTER — Ambulatory Visit (HOSPITAL_COMMUNITY): Admission: RE | Admit: 2014-07-11 | Payer: 59 | Source: Ambulatory Visit | Admitting: Gastroenterology

## 2014-07-11 SURGERY — MANOMETRY, ESOPHAGUS

## 2014-07-12 ENCOUNTER — Telehealth: Payer: Self-pay | Admitting: Neurology

## 2014-07-12 NOTE — Telephone Encounter (Signed)
I have informed this patient of his lab results.  He said he received a call saying his copay for the MRIs was about 1500.00.  He says he has Medicare Part B but has never provided that card to our office.  He is going to bring it here in the morning and have them give it to you.  He would like to see if it will help lower his copay for the scans.

## 2014-07-12 NOTE — Telephone Encounter (Signed)
Daniel Holland:  Please call patient, laboratory showed no significant abnormalities  Continue follow-up after MRI

## 2014-07-13 LAB — ACETYLCHOLINE RECEPTOR, BINDING: AChR Binding Ab, Serum: <0.03 nmol/L (ref 0.00–0.24)

## 2014-07-13 LAB — ANA W/REFLEX IF POSITIVE: ANA: NEGATIVE

## 2014-07-13 LAB — CK: CK TOTAL: 188 U/L (ref 24–204)

## 2014-07-13 LAB — THYROID PANEL WITH TSH
Free Thyroxine Index: 2.2 (ref 1.2–4.9)
T3 Uptake Ratio: 23 % — ABNORMAL LOW (ref 24–39)
T4, Total: 9.6 ug/dL (ref 4.5–12.0)
TSH: 1.71 u[IU]/mL (ref 0.450–4.500)

## 2014-07-13 LAB — C-REACTIVE PROTEIN: CRP: 1 mg/L (ref 0.0–4.9)

## 2014-07-13 LAB — SEDIMENTATION RATE: Sed Rate: 2 mm/hr (ref 0–30)

## 2014-07-13 LAB — ACETYLCHOLINE RECEPTOR, MODULATING: Acetylcholine Modulat Ab: <12 % (ref 0–20)

## 2014-08-11 ENCOUNTER — Encounter: Payer: Self-pay | Admitting: Neurology

## 2014-08-11 ENCOUNTER — Ambulatory Visit (INDEPENDENT_AMBULATORY_CARE_PROVIDER_SITE_OTHER): Payer: 59 | Admitting: Neurology

## 2014-08-11 VITALS — BP 159/95 | HR 76 | Ht 68.0 in | Wt 189.0 lb

## 2014-08-11 DIAGNOSIS — R202 Paresthesia of skin: Secondary | ICD-10-CM | POA: Diagnosis not present

## 2014-08-11 DIAGNOSIS — R1310 Dysphagia, unspecified: Secondary | ICD-10-CM

## 2014-08-11 NOTE — Progress Notes (Signed)
PATIENT: Daniel Holland DOB: 10-04-1950  HISTORICAL  Daniel Holland is a 64 years old right-handed Caucasian male, referred by GI physician Dr. Benson Norway, and his primary care physician Dr. 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 Benson Norway, difficult to intubate esophagus, otherwise normal EGD, barium swallowing testing January 2016, showed moderate cervical esophageal phase dysphagia, reduced upper esophagus relaxation, leads to moderate residual, primarily at the valleculae and upper esophagus  Over the past few months, his symptoms overall has much improved, but not back to normal, few weeks since initial symptoms, he also noticed right ear, right upper extremity, and the leg paresthesia, no weakness,  Laboratory evaluation in November 2015, normal CMP, with exception of elevated hemoglobin 17.9, normal CMP with exception of elevated glucose 140  UPDATE March 17th 2016:  He still has right arm and right leg pins needle sensation,  Right lateral foot, lateral leg sensitive to touch, right cervical region, right arm, paresthesia, he has no weakness, no trouble walking, no dysarthria, he also has mild swallowing difficulty need to wash down, but no chewing difficulty,   laboratory evaluation showed normal or negative CK, ANA, TSH, acetylcholine receptor antibody,  He did not have MRI of the brain due to insurance reasons  REVIEW OF SYSTEMS: Full 14 system review of systems performed and notable only for as  above  ALLERGIES: Allergies  Allergen Reactions  . Percocet [Oxycodone-Acetaminophen] Nausea And Vomiting  . Vicodin [Hydrocodone-Acetaminophen] Nausea And Vomiting    HOME MEDICATIONS: Current Outpatient Prescriptions  Medication Sig Dispense Refill  . amLODipine (NORVASC) 5 MG tablet Take 5 mg by mouth daily.    Marland Kitchen atorvastatin (LIPITOR) 20 MG tablet Take 20 mg by mouth daily.    . Cyanocobalamin (B-12) 2500 MCG TABS Take 2,500 mg by mouth 1 day or 1 dose.    . loratadine (CLARITIN) 10 MG tablet Take 10 mg by mouth daily as needed for allergies.    . pantoprazole (PROTONIX) 40 MG tablet Take 1 tablet (40 mg total) by mouth daily. 30 tablet 0  . Triamcinolone Acetonide (NASACORT AQ NA) Place 1 spray into the nose 2 (two) times daily.     No current facility-administered medications for this visit.  Intubation of the esophagus was difficult. I do not know if this was secondadry to resistance by the patient versus an acute spasm of the UES. The esophagus and gastroesophageal junction were completely normal in appearance. The stomach was entered and closely examined.The antrum, angularis, and lesser curvature were well visualized, including a retroflexed view of the cardia and fundus. The stomach wall was normally distensable. The scope passed easily through the pylorus into the duodenum. Retroflexed views revealed no abnormalities. The scope was then withdrawn from the patient and the procedure completed. COMPLICATIONS: There were no immediate complications. ENDOSCOPIC IMPRESSION: 1) Normal EGD. 2) ? Acute spasm of the UES versus resistance by the patient  PAST MEDICAL HISTORY: Past Medical History  Diagnosis Date  . Seasonal allergies   . History of kidney  stones     RIGHT SIDE 2007-  PASSED SPONTATEOUS  . Right ureteral stone   . Hypertension     CURRENTLY NO MEDS  . Frequency of urination   . Urgency of urination   . Hematuria   . Chronic kidney disease     left ureteral  stone  . Dysphagia   . Hyperlipemia     PAST SURGICAL HISTORY: Past Surgical History  Procedure Laterality Date  . Closed reduction right wrist Right 1965  . Cystoscopy/retrograde/ureteroscopy Right 03/01/2013    Procedure: CYSTOSCOPY/RETROGRADE/URETEROSCOPY;  Surgeon: Molli Hazard, MD;  Location: [facility];  Service: Urology;  Laterality: Right;  CYSTOSCOPY ,RIGHT URETERAL STENT PLACEMENT, RIGHT RETROGRADE PYELOGRAM  . Circumcision  AGE 21  . Cystoscopy with retrograde pyelogram, ureteroscopy and stent placement Right 03/10/2013    Procedure: CYSTOSCOPY WITH RETROGRADE PYELOGRAM, URETEROSCOPY AND STENT PLACEMENT, left retrograde   " RIGHT STENT EXCHANGE AND POSSIBLE RIGHT RETROGRADE  PYELOGRAM";  Surgeon: Molli Hazard, MD;  Location: [facility];  Service: Urology;  Laterality: Right;  . Holmium laser application Right 35/36/1443    Procedure: HOLMIUM LASER APPLICATION;  Surgeon: Molli Hazard, MD;  Location: [facility];  Service: Urology;  Laterality: Right;  . Cystoscopy w/ ureteral stent placement Left 02/12/2014    Procedure: CYSTOSCOPY WITH RETROGRADE PYELOGRAM/URETERAL STENT PLACEMENT;  Surgeon: Bernestine Amass, MD;  Location: [facility];  Service: Urology;  Laterality: Left;  . Esophagogastroduodenoscopy N/A 04/12/2014    Procedure: ESOPHAGOGASTRODUODENOSCOPY (EGD);  Surgeon: Beryle Beams, MD;  Location: [facility];  Service: Endoscopy;  Laterality: N/A;    FAMILY HISTORY: Family History  Problem Relation Age of Onset  . Thyroid disease Mother   . Alzheimer's disease Mother     SOCIAL HISTORY:  History   Social History  . Marital Status: Divorced    Spouse Name: N/A  . Number of Children: 0  . Years of Education: College   Occupational History  . Manager of athletic facility [employer]  . Smoking status: Never Smoker   . Smokeless tobacco: Never Used  . Alcohol Use: No  . Drug  Use: No  . Sexual Activity: Not Currently   Other Topics Concern  . Not on file   Social History Narrative   Lives at home alone.   Right-handed.   Occasional caffeine use.   PHYSICAL EXAM   Filed Vitals:   08/11/14 1057  BP: 159/95  Pulse: 76  Height: 5\' 8"  (1.727 m)  Weight: 189 lb (85.73 kg)    Not recorded      Body mass index is 28.74 kg/(m^2).  PHYSICAL EXAMNIATION:  Gen: NAD, conversant, well nourised, obese, well groomed                     Cardiovascular: Regular rate rhythm, no peripheral edema, warm, nontender. Eyes: Conjunctivae clear without exudates or hemorrhage Neck: Supple, no carotid bruise. Pulmonary: Clear to auscultation bilaterally   NEUROLOGICAL EXAM:  MENTAL STATUS: Speech:    Speech is normal; fluent and spontaneous with normal comprehension.  Cognition:    The patient is oriented to person, place, and time;     recent and remote memory intact;     language fluent;     normal attention, concentration,     fund of knowledge.  CRANIAL NERVES: CN II: Visual fields are full to confrontation. Fundoscopic exam is normal with sharp discs and no vascular  changes. Venous pulsations are present bilaterally. Pupils are 4 mm and briskly reactive to light. Visual acuity is 20/20 bilaterally. CN III, IV, VI: extraocular movement are normal. No ptosis. CN V: Facial sensation is intact to pinprick in all 3 divisions bilaterally. Corneal responses are intact.  CN VII: Face is symmetric with normal eye closure and smile. CN VIII: Hearing is normal to rubbing fingers CN IX, X: Palate elevates symmetrically. Phonation is normal. CN XI: Head turning and shoulder shrug are intact CN XII: Tongue is midline with normal movements and no atrophy.  MOTOR: There is no pronator drift of out-stretched arms. Muscle bulk and tone are normal. Muscle strength is normal.   Shoulder abduction Shoulder external rotation Elbow flexion Elbow extension Wrist flexion Wrist  extension Finger abduction Hip flexion Knee flexion Knee extension Ankle dorsi flexion Ankle plantar flexion  R 5 5 5 5 5 5 5 5 5 5 5 5   L 5 5 5 5 5 5 5 5 5 5 5 5     REFLEXES: Reflexes are 2+ and symmetric at the biceps, triceps, knees, and ankles. Plantar responses are flexor.  SENSORY: Light touch, pinprick, position sense, and vibration sense are intact in fingers and toes.  COORDINATION: Rapid alternating movements and fine finger movements are intact. There is no dysmetria on finger-to-nose and heel-knee-shin. There are no abnormal or extraneous movements.   GAIT/STANCE: Posture is normal. Gait is steady with normal steps, base, arm swing, and turning. Heel and toe walking are normal. Tandem gait is normal.  Romberg is absent.     DIAGNOSTIC DATA (LABS, IMAGING, TESTING) - I reviewed patient records, labs, notes, testing and imaging myself where available.  Lab Results  Component Value Date   WBC 10.2 04/12/2014   HGB 17.9* 04/12/2014   HCT 49.5 04/12/2014   MCV 86.7 04/12/2014   PLT 261 04/12/2014      Component Value Date/Time   NA 138 04/12/2014 1154   K 4.0 04/12/2014 1154   CL 99 04/12/2014 1154   CO2 23 04/12/2014 1154   GLUCOSE 144* 04/12/2014 1154   BUN 20 04/12/2014 1154   CREATININE 1.00 04/12/2014 1154   CALCIUM 9.9 04/12/2014 1154   PROT 8.4* 04/12/2014 1154   ALBUMIN 4.7 04/12/2014 1154   AST 29 04/12/2014 1154   ALT 29 04/12/2014 1154   ALKPHOS 82 04/12/2014 1154   BILITOT 1.0 04/12/2014 1154   GFRNONAA 77* 04/12/2014 1154   GFRAA 89* 04/12/2014 1154   ASSESSMENT AND PLAN  Daniel Holland is a 64 y.o. male  With history of hypertension, hyperlipidemia, presented with acute onset of dysphagia in April 11 2015, right hemiparesthesia, unbalanced gait, slowly improved over past few months, following episodes of vomiting, also complains of right arm, leg paresthesia which has been persistent  Differentiation diagnosis includes brainstem stroke,  versus brainstem structure occupying lesions. I have suggested MRI of brain, Follow-up visit after MRI of the brain  Marcial Pacas, M.D. Ph.D.  [facility] [address], [place], [place] [address] Ph: [phone] Fax: [phone]

## 2014-08-11 NOTE — Addendum Note (Signed)
Addended by: Marcial Pacas on: 08/11/2014 11:21 AM   Modules accepted: Orders

## 2014-08-17 ENCOUNTER — Ambulatory Visit (INDEPENDENT_AMBULATORY_CARE_PROVIDER_SITE_OTHER): Payer: 59

## 2014-08-17 ENCOUNTER — Other Ambulatory Visit: Payer: 59

## 2014-08-17 DIAGNOSIS — R269 Unspecified abnormalities of gait and mobility: Secondary | ICD-10-CM | POA: Diagnosis not present

## 2014-08-17 DIAGNOSIS — R1310 Dysphagia, unspecified: Secondary | ICD-10-CM | POA: Diagnosis not present

## 2014-08-17 DIAGNOSIS — R202 Paresthesia of skin: Secondary | ICD-10-CM | POA: Diagnosis not present

## 2014-08-19 ENCOUNTER — Telehealth: Payer: Self-pay | Admitting: Neurology

## 2014-08-19 NOTE — Telephone Encounter (Signed)
I have called him MRI findings  Equivocal MRI brain (without) demonstrating: 1. Small linear, cystic focus (4-26mm) in the left lateral medulla. No associated gliosis.  2. Remainder of brain is unremarkable.  His symptoms has been stable,  Sharyn Lull, please give him a follow-up appointment in 2-3 months

## 2014-08-22 NOTE — Telephone Encounter (Signed)
Patient's appt scheduled.

## 2014-10-10 ENCOUNTER — Ambulatory Visit (INDEPENDENT_AMBULATORY_CARE_PROVIDER_SITE_OTHER): Payer: 59 | Admitting: Neurology

## 2014-10-10 ENCOUNTER — Encounter: Payer: Self-pay | Admitting: Neurology

## 2014-10-10 VITALS — BP 159/96 | HR 64 | Ht 68.0 in | Wt 189.0 lb

## 2014-10-10 DIAGNOSIS — R1310 Dysphagia, unspecified: Secondary | ICD-10-CM | POA: Diagnosis not present

## 2014-10-10 DIAGNOSIS — I639 Cerebral infarction, unspecified: Secondary | ICD-10-CM

## 2014-10-10 NOTE — Progress Notes (Addendum)
PATIENT: Daniel Holland DOB: Mar 12, 1951  HISTORICAL  Daniel Holland is a 64 years old right-handed Caucasian male, referred by GI physician Dr. Benson Norway, and his primary care physician Dr. 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 Benson Norway, difficult to intubate esophagus, otherwise normal EGD, barium swallowing testing January 2016, showed moderate cervical esophageal phase dysphagia, reduced upper esophagus relaxation, leads to moderate residual, primarily at the valleculae and upper esophagus  Over the past few months, his symptoms overall has much improved, but not back to normal, few weeks since initial symptoms, he also noticed right ear, right upper extremity, and the leg paresthesia, no weakness,  Laboratory evaluation in November 2015, normal CMP, with exception of elevated hemoglobin 17.9, normal CMP with exception of elevated glucose 140  UPDATE March 17th 2016:  He still has right arm and right leg pins needle sensation,  Right lateral foot, lateral leg sensitive to touch, right cervical region, right arm, paresthesia, he has no weakness, no trouble walking, no dysarthria, he also has mild swallowing difficulty need to wash down, but no chewing difficulty,   laboratory evaluation showed normal or negative CK, ANA, TSH, acetylcholine receptor antibody,  He did not have MRI of the brain due to insurance reasons  UPDATE May 16th 2016: His swallowing difficulty overall has much improved, only has slight  right sided paresthesia, no gait difficulty, no double vision  I have personally reviewed MRI with patient, there was left lateral medullar region small linear lesions, he is now taking baby aspirin daily  He complains excessive daytime sleepiness, ESS score is 13, FSS score is 19, He snores, waking up catching breath,  REVIEW OF SYSTEMS: Full 14 system review of systems performed and notable only for as above  ALLERGIES: Allergies  Allergen Reactions  . Percocet [Oxycodone-Acetaminophen] Nausea And Vomiting  . Vicodin [Hydrocodone-Acetaminophen] Nausea And Vomiting    HOME MEDICATIONS: Current Outpatient Prescriptions  Medication Sig Dispense Refill  . amLODipine (NORVASC) 5 MG tablet Take 5 mg by mouth daily.    Marland Kitchen aspirin 81 MG tablet Take 81 mg by mouth daily.    Marland Kitchen atorvastatin (LIPITOR) 20 MG tablet Take 20 mg by mouth daily.    . Cyanocobalamin (B-12) 2500 MCG TABS Take 5,000 mcg by mouth 1 day or 1 dose.     . loratadine (CLARITIN) 10 MG tablet Take 10 mg by mouth daily as needed for allergies.    . metFORMIN (GLUCOPHAGE) 500 MG tablet Take 500 mg by mouth every morning.  3  . Triamcinolone Acetonide (NASACORT AQ NA) Place 1 spray into the nose 2 (two) times daily as needed.     Marland Kitchen VITAMIN D, CHOLECALCIFEROL, PO Take 5,000 Units by mouth.     No current facility-administered medications for this visit.   PAST MEDICAL HISTORY: Past Medical History  Diagnosis Date  . Seasonal allergies   . History of kidney stones     RIGHT SIDE 2007-  PASSED SPONTATEOUS  . Right ureteral stone   . Hypertension  CURRENTLY NO MEDS  . Frequency of urination   . Urgency of urination   . Hematuria   . Chronic kidney disease     left ureteral stone  . Dysphagia   . Hyperlipemia     PAST SURGICAL HISTORY: Past Surgical History  Procedure Laterality Date  . Closed reduction right wrist Right 1965  . Cystoscopy/retrograde/ureteroscopy Right 03/01/2013    Procedure:  CYSTOSCOPY/RETROGRADE/URETEROSCOPY;  Surgeon: Molli Hazard, MD;  Location: [facility];  Service: Urology;  Laterality: Right;  CYSTOSCOPY ,RIGHT URETERAL STENT PLACEMENT, RIGHT RETROGRADE PYELOGRAM  . Circumcision  AGE 26  . Cystoscopy with retrograde pyelogram, ureteroscopy and stent placement Right 03/10/2013    Procedure: CYSTOSCOPY WITH RETROGRADE PYELOGRAM, URETEROSCOPY AND STENT PLACEMENT, left retrograde   " RIGHT STENT EXCHANGE AND POSSIBLE RIGHT RETROGRADE  PYELOGRAM";  Surgeon: Molli Hazard, MD;  Location: [facility];  Service: Urology;  Laterality: Right;  . Holmium laser application Right 85/06/7739    Procedure: HOLMIUM LASER APPLICATION;  Surgeon: Molli Hazard, MD;  Location: [facility];  Service: Urology;  Laterality: Right;  . Cystoscopy w/ ureteral stent placement Left 02/12/2014    Procedure: CYSTOSCOPY WITH RETROGRADE PYELOGRAM/URETERAL STENT PLACEMENT;  Surgeon: Bernestine Amass, MD;  Location: [facility];  Service: Urology;  Laterality: Left;  . Esophagogastroduodenoscopy N/A 04/12/2014    Procedure: ESOPHAGOGASTRODUODENOSCOPY (EGD);  Surgeon: Beryle Beams, MD;  Location: [facility];  Service: Endoscopy;  Laterality: N/A;    FAMILY HISTORY: Family History  Problem Relation Age of Onset  . Thyroid disease Mother   . Alzheimer's disease Mother     SOCIAL HISTORY:  History   Social History  . Marital Status: Divorced    Spouse Name: N/A  . Number of Children: 0  . Years of Education: College   Occupational History  . 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 Smoking status: Never Smoker   . Smokeless tobacco: Never Used  . Alcohol Use: No  . Drug Use: No  . Sexual Activity: Not Currently   Other Topics Concern  . Not on file   Social History Narrative   Lives at home alone.   Right-handed.   Occasional caffeine use.   PHYSICAL EXAM   Filed Vitals:   10/10/14 1138    BP: 159/96  Pulse: 64  Height: 5\' 8"  (1.727 m)  Weight: 189 lb (85.73 kg)    Not recorded      Body mass index is 28.74 kg/(m^2).  PHYSICAL EXAMNIATION:  Gen: NAD, conversant, well nourised, obese, well groomed                     Cardiovascular: Regular rate rhythm, no peripheral edema, warm, nontender. Eyes: Conjunctivae clear without exudates or hemorrhage Neck: Supple, no carotid bruise. Pulmonary: Clear to auscultation bilaterally   NEUROLOGICAL EXAM:  MENTAL STATUS: Speech:    Speech is normal; fluent and spontaneous with normal comprehension.  Cognition:    The patient is oriented to person, place, and time;     recent and remote memory intact;     language fluent;     normal attention, concentration,     fund of knowledge.  CRANIAL NERVES: CN II: Visual fields are full to confrontation. Fundoscopic exam is normal with sharp discs and no vascular changes. Venous pulsations are present bilaterally. Pupils are 4 mm and briskly reactive to light. Visual acuity is 20/20 bilaterally. CN III, IV,  VI: extraocular movement are normal. No ptosis. CN V: Facial sensation is intact to pinprick in all 3 divisions bilaterally. Corneal responses are intact.  CN VII: Face is symmetric with normal eye closure and smile. CN VIII: Hearing is normal to rubbing fingers CN IX, X: Palate elevates symmetrically. Phonation is normal. CN XI: Head turning and shoulder shrug are intact CN XII: Tongue is midline with normal movements and no atrophy.  MOTOR: There is no pronator drift of out-stretched arms. Muscle bulk and tone are normal. Muscle strength is normal.   Shoulder abduction Shoulder external rotation Elbow flexion Elbow extension Wrist flexion Wrist extension Finger abduction Hip flexion Knee flexion Knee extension Ankle dorsi flexion Ankle plantar flexion  R 5 5 5 5 5 5 5 5 5 5 5 5   L 5 5 5 5 5 5 5 5 5 5 5 5     REFLEXES: Reflexes are 2+ and symmetric at the biceps, triceps,  knees, and ankles. Plantar responses are flexor.  SENSORY: Light touch, pinprick, position sense, and vibration sense are intact in fingers and toes.  COORDINATION: Rapid alternating movements and fine finger movements are intact. There is no dysmetria on finger-to-nose and heel-knee-shin. There are no abnormal or extraneous movements.   GAIT/STANCE: Posture is normal. Gait is steady with normal steps, base, arm swing, and turning. Heel and toe walking are normal. Tandem gait is normal.  Romberg is absent.     DIAGNOSTIC DATA (LABS, IMAGING, TESTING) - I reviewed patient records, labs, notes, testing and imaging myself where available.  Lab Results  Component Value Date   WBC 10.2 04/12/2014   HGB 17.9* 04/12/2014   HCT 49.5 04/12/2014   MCV 86.7 04/12/2014   PLT 261 04/12/2014      Component Value Date/Time   NA 138 04/12/2014 1154   K 4.0 04/12/2014 1154   CL 99 04/12/2014 1154   CO2 23 04/12/2014 1154   GLUCOSE 144* 04/12/2014 1154   BUN 20 04/12/2014 1154   CREATININE 1.00 04/12/2014 1154   CALCIUM 9.9 04/12/2014 1154   PROT 8.4* 04/12/2014 1154   ALBUMIN 4.7 04/12/2014 1154   AST 29 04/12/2014 1154   ALT 29 04/12/2014 1154   ALKPHOS 82 04/12/2014 1154   BILITOT 1.0 04/12/2014 1154   GFRNONAA 77* 04/12/2014 1154   GFRAA 89* 04/12/2014 1154   ASSESSMENT AND PLAN  Steffen Hase is a 64 y.o. 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 Ph.D.  [facility] [address], [place], [place] [address] Ph: [phone] Fax: [phone]

## 2015-02-20 DIAGNOSIS — I10 Essential (primary) hypertension: Secondary | ICD-10-CM | POA: Diagnosis not present

## 2015-02-20 DIAGNOSIS — E663 Overweight: Secondary | ICD-10-CM | POA: Diagnosis not present

## 2015-02-20 DIAGNOSIS — R7309 Other abnormal glucose: Secondary | ICD-10-CM | POA: Diagnosis not present

## 2015-02-20 DIAGNOSIS — E780 Pure hypercholesterolemia: Secondary | ICD-10-CM | POA: Diagnosis not present

## 2015-03-06 DIAGNOSIS — M50122 Cervical disc disorder at C5-C6 level with radiculopathy: Secondary | ICD-10-CM | POA: Diagnosis not present

## 2015-03-06 DIAGNOSIS — M9903 Segmental and somatic dysfunction of lumbar region: Secondary | ICD-10-CM | POA: Diagnosis not present

## 2015-03-06 DIAGNOSIS — M9901 Segmental and somatic dysfunction of cervical region: Secondary | ICD-10-CM | POA: Diagnosis not present

## 2015-03-06 DIAGNOSIS — M50322 Other cervical disc degeneration at C5-C6 level: Secondary | ICD-10-CM | POA: Diagnosis not present

## 2015-03-06 DIAGNOSIS — M9905 Segmental and somatic dysfunction of pelvic region: Secondary | ICD-10-CM | POA: Diagnosis not present

## 2015-03-06 DIAGNOSIS — M5417 Radiculopathy, lumbosacral region: Secondary | ICD-10-CM | POA: Diagnosis not present

## 2015-03-06 DIAGNOSIS — M5137 Other intervertebral disc degeneration, lumbosacral region: Secondary | ICD-10-CM | POA: Diagnosis not present

## 2015-03-06 DIAGNOSIS — M9904 Segmental and somatic dysfunction of sacral region: Secondary | ICD-10-CM | POA: Diagnosis not present

## 2015-03-06 DIAGNOSIS — M9902 Segmental and somatic dysfunction of thoracic region: Secondary | ICD-10-CM | POA: Diagnosis not present

## 2015-03-06 DIAGNOSIS — M5383 Other specified dorsopathies, cervicothoracic region: Secondary | ICD-10-CM | POA: Diagnosis not present

## 2015-03-07 DIAGNOSIS — M5137 Other intervertebral disc degeneration, lumbosacral region: Secondary | ICD-10-CM | POA: Diagnosis not present

## 2015-03-07 DIAGNOSIS — M5417 Radiculopathy, lumbosacral region: Secondary | ICD-10-CM | POA: Diagnosis not present

## 2015-03-07 DIAGNOSIS — M9905 Segmental and somatic dysfunction of pelvic region: Secondary | ICD-10-CM | POA: Diagnosis not present

## 2015-03-07 DIAGNOSIS — M9903 Segmental and somatic dysfunction of lumbar region: Secondary | ICD-10-CM | POA: Diagnosis not present

## 2015-03-07 DIAGNOSIS — M9902 Segmental and somatic dysfunction of thoracic region: Secondary | ICD-10-CM | POA: Diagnosis not present

## 2015-03-07 DIAGNOSIS — M50122 Cervical disc disorder at C5-C6 level with radiculopathy: Secondary | ICD-10-CM | POA: Diagnosis not present

## 2015-03-07 DIAGNOSIS — M9901 Segmental and somatic dysfunction of cervical region: Secondary | ICD-10-CM | POA: Diagnosis not present

## 2015-03-07 DIAGNOSIS — M5383 Other specified dorsopathies, cervicothoracic region: Secondary | ICD-10-CM | POA: Diagnosis not present

## 2015-03-07 DIAGNOSIS — M50322 Other cervical disc degeneration at C5-C6 level: Secondary | ICD-10-CM | POA: Diagnosis not present

## 2015-03-07 DIAGNOSIS — M9904 Segmental and somatic dysfunction of sacral region: Secondary | ICD-10-CM | POA: Diagnosis not present

## 2015-03-08 DIAGNOSIS — M9904 Segmental and somatic dysfunction of sacral region: Secondary | ICD-10-CM | POA: Diagnosis not present

## 2015-03-08 DIAGNOSIS — M5137 Other intervertebral disc degeneration, lumbosacral region: Secondary | ICD-10-CM | POA: Diagnosis not present

## 2015-03-08 DIAGNOSIS — M50122 Cervical disc disorder at C5-C6 level with radiculopathy: Secondary | ICD-10-CM | POA: Diagnosis not present

## 2015-03-08 DIAGNOSIS — M5417 Radiculopathy, lumbosacral region: Secondary | ICD-10-CM | POA: Diagnosis not present

## 2015-03-08 DIAGNOSIS — M9903 Segmental and somatic dysfunction of lumbar region: Secondary | ICD-10-CM | POA: Diagnosis not present

## 2015-03-08 DIAGNOSIS — M50322 Other cervical disc degeneration at C5-C6 level: Secondary | ICD-10-CM | POA: Diagnosis not present

## 2015-03-08 DIAGNOSIS — M9902 Segmental and somatic dysfunction of thoracic region: Secondary | ICD-10-CM | POA: Diagnosis not present

## 2015-03-08 DIAGNOSIS — M5383 Other specified dorsopathies, cervicothoracic region: Secondary | ICD-10-CM | POA: Diagnosis not present

## 2015-03-08 DIAGNOSIS — M9905 Segmental and somatic dysfunction of pelvic region: Secondary | ICD-10-CM | POA: Diagnosis not present

## 2015-03-08 DIAGNOSIS — M9901 Segmental and somatic dysfunction of cervical region: Secondary | ICD-10-CM | POA: Diagnosis not present

## 2015-03-10 DIAGNOSIS — M9903 Segmental and somatic dysfunction of lumbar region: Secondary | ICD-10-CM | POA: Diagnosis not present

## 2015-03-10 DIAGNOSIS — M9905 Segmental and somatic dysfunction of pelvic region: Secondary | ICD-10-CM | POA: Diagnosis not present

## 2015-03-10 DIAGNOSIS — M50122 Cervical disc disorder at C5-C6 level with radiculopathy: Secondary | ICD-10-CM | POA: Diagnosis not present

## 2015-03-10 DIAGNOSIS — M5417 Radiculopathy, lumbosacral region: Secondary | ICD-10-CM | POA: Diagnosis not present

## 2015-03-10 DIAGNOSIS — M9904 Segmental and somatic dysfunction of sacral region: Secondary | ICD-10-CM | POA: Diagnosis not present

## 2015-03-10 DIAGNOSIS — M5383 Other specified dorsopathies, cervicothoracic region: Secondary | ICD-10-CM | POA: Diagnosis not present

## 2015-03-10 DIAGNOSIS — M9902 Segmental and somatic dysfunction of thoracic region: Secondary | ICD-10-CM | POA: Diagnosis not present

## 2015-03-10 DIAGNOSIS — M50322 Other cervical disc degeneration at C5-C6 level: Secondary | ICD-10-CM | POA: Diagnosis not present

## 2015-03-10 DIAGNOSIS — M5137 Other intervertebral disc degeneration, lumbosacral region: Secondary | ICD-10-CM | POA: Diagnosis not present

## 2015-03-10 DIAGNOSIS — M9901 Segmental and somatic dysfunction of cervical region: Secondary | ICD-10-CM | POA: Diagnosis not present

## 2015-03-13 DIAGNOSIS — M9904 Segmental and somatic dysfunction of sacral region: Secondary | ICD-10-CM | POA: Diagnosis not present

## 2015-03-13 DIAGNOSIS — M9902 Segmental and somatic dysfunction of thoracic region: Secondary | ICD-10-CM | POA: Diagnosis not present

## 2015-03-13 DIAGNOSIS — M5417 Radiculopathy, lumbosacral region: Secondary | ICD-10-CM | POA: Diagnosis not present

## 2015-03-13 DIAGNOSIS — M9903 Segmental and somatic dysfunction of lumbar region: Secondary | ICD-10-CM | POA: Diagnosis not present

## 2015-03-13 DIAGNOSIS — M5383 Other specified dorsopathies, cervicothoracic region: Secondary | ICD-10-CM | POA: Diagnosis not present

## 2015-03-13 DIAGNOSIS — M5137 Other intervertebral disc degeneration, lumbosacral region: Secondary | ICD-10-CM | POA: Diagnosis not present

## 2015-03-13 DIAGNOSIS — M9901 Segmental and somatic dysfunction of cervical region: Secondary | ICD-10-CM | POA: Diagnosis not present

## 2015-03-13 DIAGNOSIS — M50122 Cervical disc disorder at C5-C6 level with radiculopathy: Secondary | ICD-10-CM | POA: Diagnosis not present

## 2015-03-13 DIAGNOSIS — M50322 Other cervical disc degeneration at C5-C6 level: Secondary | ICD-10-CM | POA: Diagnosis not present

## 2015-03-13 DIAGNOSIS — M9905 Segmental and somatic dysfunction of pelvic region: Secondary | ICD-10-CM | POA: Diagnosis not present

## 2015-03-15 DIAGNOSIS — M5417 Radiculopathy, lumbosacral region: Secondary | ICD-10-CM | POA: Diagnosis not present

## 2015-03-15 DIAGNOSIS — M5383 Other specified dorsopathies, cervicothoracic region: Secondary | ICD-10-CM | POA: Diagnosis not present

## 2015-03-15 DIAGNOSIS — M9904 Segmental and somatic dysfunction of sacral region: Secondary | ICD-10-CM | POA: Diagnosis not present

## 2015-03-15 DIAGNOSIS — M50122 Cervical disc disorder at C5-C6 level with radiculopathy: Secondary | ICD-10-CM | POA: Diagnosis not present

## 2015-03-15 DIAGNOSIS — M50322 Other cervical disc degeneration at C5-C6 level: Secondary | ICD-10-CM | POA: Diagnosis not present

## 2015-03-15 DIAGNOSIS — M9905 Segmental and somatic dysfunction of pelvic region: Secondary | ICD-10-CM | POA: Diagnosis not present

## 2015-03-15 DIAGNOSIS — M5137 Other intervertebral disc degeneration, lumbosacral region: Secondary | ICD-10-CM | POA: Diagnosis not present

## 2015-03-15 DIAGNOSIS — M9901 Segmental and somatic dysfunction of cervical region: Secondary | ICD-10-CM | POA: Diagnosis not present

## 2015-03-15 DIAGNOSIS — M9902 Segmental and somatic dysfunction of thoracic region: Secondary | ICD-10-CM | POA: Diagnosis not present

## 2015-03-15 DIAGNOSIS — M9903 Segmental and somatic dysfunction of lumbar region: Secondary | ICD-10-CM | POA: Diagnosis not present

## 2015-03-16 DIAGNOSIS — M9905 Segmental and somatic dysfunction of pelvic region: Secondary | ICD-10-CM | POA: Diagnosis not present

## 2015-03-16 DIAGNOSIS — M50322 Other cervical disc degeneration at C5-C6 level: Secondary | ICD-10-CM | POA: Diagnosis not present

## 2015-03-16 DIAGNOSIS — M50122 Cervical disc disorder at C5-C6 level with radiculopathy: Secondary | ICD-10-CM | POA: Diagnosis not present

## 2015-03-16 DIAGNOSIS — M9902 Segmental and somatic dysfunction of thoracic region: Secondary | ICD-10-CM | POA: Diagnosis not present

## 2015-03-16 DIAGNOSIS — M9901 Segmental and somatic dysfunction of cervical region: Secondary | ICD-10-CM | POA: Diagnosis not present

## 2015-03-16 DIAGNOSIS — M9903 Segmental and somatic dysfunction of lumbar region: Secondary | ICD-10-CM | POA: Diagnosis not present

## 2015-03-16 DIAGNOSIS — M5137 Other intervertebral disc degeneration, lumbosacral region: Secondary | ICD-10-CM | POA: Diagnosis not present

## 2015-03-16 DIAGNOSIS — M5383 Other specified dorsopathies, cervicothoracic region: Secondary | ICD-10-CM | POA: Diagnosis not present

## 2015-03-16 DIAGNOSIS — M9904 Segmental and somatic dysfunction of sacral region: Secondary | ICD-10-CM | POA: Diagnosis not present

## 2015-03-16 DIAGNOSIS — M5417 Radiculopathy, lumbosacral region: Secondary | ICD-10-CM | POA: Diagnosis not present

## 2015-03-20 DIAGNOSIS — M50322 Other cervical disc degeneration at C5-C6 level: Secondary | ICD-10-CM | POA: Diagnosis not present

## 2015-03-20 DIAGNOSIS — M9905 Segmental and somatic dysfunction of pelvic region: Secondary | ICD-10-CM | POA: Diagnosis not present

## 2015-03-20 DIAGNOSIS — M5137 Other intervertebral disc degeneration, lumbosacral region: Secondary | ICD-10-CM | POA: Diagnosis not present

## 2015-03-20 DIAGNOSIS — M5383 Other specified dorsopathies, cervicothoracic region: Secondary | ICD-10-CM | POA: Diagnosis not present

## 2015-03-20 DIAGNOSIS — M9904 Segmental and somatic dysfunction of sacral region: Secondary | ICD-10-CM | POA: Diagnosis not present

## 2015-03-20 DIAGNOSIS — M50122 Cervical disc disorder at C5-C6 level with radiculopathy: Secondary | ICD-10-CM | POA: Diagnosis not present

## 2015-03-20 DIAGNOSIS — M9903 Segmental and somatic dysfunction of lumbar region: Secondary | ICD-10-CM | POA: Diagnosis not present

## 2015-03-20 DIAGNOSIS — M9901 Segmental and somatic dysfunction of cervical region: Secondary | ICD-10-CM | POA: Diagnosis not present

## 2015-03-20 DIAGNOSIS — M9902 Segmental and somatic dysfunction of thoracic region: Secondary | ICD-10-CM | POA: Diagnosis not present

## 2015-03-20 DIAGNOSIS — M5417 Radiculopathy, lumbosacral region: Secondary | ICD-10-CM | POA: Diagnosis not present

## 2015-03-21 DIAGNOSIS — M50122 Cervical disc disorder at C5-C6 level with radiculopathy: Secondary | ICD-10-CM | POA: Diagnosis not present

## 2015-03-21 DIAGNOSIS — M9901 Segmental and somatic dysfunction of cervical region: Secondary | ICD-10-CM | POA: Diagnosis not present

## 2015-03-21 DIAGNOSIS — M5417 Radiculopathy, lumbosacral region: Secondary | ICD-10-CM | POA: Diagnosis not present

## 2015-03-21 DIAGNOSIS — M9905 Segmental and somatic dysfunction of pelvic region: Secondary | ICD-10-CM | POA: Diagnosis not present

## 2015-03-21 DIAGNOSIS — M9903 Segmental and somatic dysfunction of lumbar region: Secondary | ICD-10-CM | POA: Diagnosis not present

## 2015-03-21 DIAGNOSIS — M5137 Other intervertebral disc degeneration, lumbosacral region: Secondary | ICD-10-CM | POA: Diagnosis not present

## 2015-03-21 DIAGNOSIS — M9904 Segmental and somatic dysfunction of sacral region: Secondary | ICD-10-CM | POA: Diagnosis not present

## 2015-03-21 DIAGNOSIS — M9902 Segmental and somatic dysfunction of thoracic region: Secondary | ICD-10-CM | POA: Diagnosis not present

## 2015-03-21 DIAGNOSIS — M50322 Other cervical disc degeneration at C5-C6 level: Secondary | ICD-10-CM | POA: Diagnosis not present

## 2015-03-21 DIAGNOSIS — M5383 Other specified dorsopathies, cervicothoracic region: Secondary | ICD-10-CM | POA: Diagnosis not present

## 2015-03-22 DIAGNOSIS — M9905 Segmental and somatic dysfunction of pelvic region: Secondary | ICD-10-CM | POA: Diagnosis not present

## 2015-03-22 DIAGNOSIS — M5137 Other intervertebral disc degeneration, lumbosacral region: Secondary | ICD-10-CM | POA: Diagnosis not present

## 2015-03-22 DIAGNOSIS — M5383 Other specified dorsopathies, cervicothoracic region: Secondary | ICD-10-CM | POA: Diagnosis not present

## 2015-03-22 DIAGNOSIS — M9904 Segmental and somatic dysfunction of sacral region: Secondary | ICD-10-CM | POA: Diagnosis not present

## 2015-03-22 DIAGNOSIS — M9903 Segmental and somatic dysfunction of lumbar region: Secondary | ICD-10-CM | POA: Diagnosis not present

## 2015-03-22 DIAGNOSIS — M9901 Segmental and somatic dysfunction of cervical region: Secondary | ICD-10-CM | POA: Diagnosis not present

## 2015-03-22 DIAGNOSIS — M9902 Segmental and somatic dysfunction of thoracic region: Secondary | ICD-10-CM | POA: Diagnosis not present

## 2015-03-22 DIAGNOSIS — M50322 Other cervical disc degeneration at C5-C6 level: Secondary | ICD-10-CM | POA: Diagnosis not present

## 2015-03-22 DIAGNOSIS — M50122 Cervical disc disorder at C5-C6 level with radiculopathy: Secondary | ICD-10-CM | POA: Diagnosis not present

## 2015-03-22 DIAGNOSIS — M5417 Radiculopathy, lumbosacral region: Secondary | ICD-10-CM | POA: Diagnosis not present

## 2015-03-28 DIAGNOSIS — I639 Cerebral infarction, unspecified: Secondary | ICD-10-CM

## 2015-03-28 HISTORY — DX: Cerebral infarction, unspecified: I63.9

## 2015-04-03 DIAGNOSIS — M9905 Segmental and somatic dysfunction of pelvic region: Secondary | ICD-10-CM | POA: Diagnosis not present

## 2015-04-03 DIAGNOSIS — M9903 Segmental and somatic dysfunction of lumbar region: Secondary | ICD-10-CM | POA: Diagnosis not present

## 2015-04-03 DIAGNOSIS — M5383 Other specified dorsopathies, cervicothoracic region: Secondary | ICD-10-CM | POA: Diagnosis not present

## 2015-04-03 DIAGNOSIS — M5417 Radiculopathy, lumbosacral region: Secondary | ICD-10-CM | POA: Diagnosis not present

## 2015-04-03 DIAGNOSIS — M9901 Segmental and somatic dysfunction of cervical region: Secondary | ICD-10-CM | POA: Diagnosis not present

## 2015-04-03 DIAGNOSIS — M5137 Other intervertebral disc degeneration, lumbosacral region: Secondary | ICD-10-CM | POA: Diagnosis not present

## 2015-04-03 DIAGNOSIS — M9902 Segmental and somatic dysfunction of thoracic region: Secondary | ICD-10-CM | POA: Diagnosis not present

## 2015-04-03 DIAGNOSIS — M9904 Segmental and somatic dysfunction of sacral region: Secondary | ICD-10-CM | POA: Diagnosis not present

## 2015-04-03 DIAGNOSIS — M50322 Other cervical disc degeneration at C5-C6 level: Secondary | ICD-10-CM | POA: Diagnosis not present

## 2015-04-03 DIAGNOSIS — M50122 Cervical disc disorder at C5-C6 level with radiculopathy: Secondary | ICD-10-CM | POA: Diagnosis not present

## 2015-04-06 DIAGNOSIS — M9902 Segmental and somatic dysfunction of thoracic region: Secondary | ICD-10-CM | POA: Diagnosis not present

## 2015-04-06 DIAGNOSIS — M50322 Other cervical disc degeneration at C5-C6 level: Secondary | ICD-10-CM | POA: Diagnosis not present

## 2015-04-06 DIAGNOSIS — M50122 Cervical disc disorder at C5-C6 level with radiculopathy: Secondary | ICD-10-CM | POA: Diagnosis not present

## 2015-04-06 DIAGNOSIS — M9904 Segmental and somatic dysfunction of sacral region: Secondary | ICD-10-CM | POA: Diagnosis not present

## 2015-04-06 DIAGNOSIS — M5137 Other intervertebral disc degeneration, lumbosacral region: Secondary | ICD-10-CM | POA: Diagnosis not present

## 2015-04-06 DIAGNOSIS — M9905 Segmental and somatic dysfunction of pelvic region: Secondary | ICD-10-CM | POA: Diagnosis not present

## 2015-04-06 DIAGNOSIS — M5383 Other specified dorsopathies, cervicothoracic region: Secondary | ICD-10-CM | POA: Diagnosis not present

## 2015-04-06 DIAGNOSIS — M9901 Segmental and somatic dysfunction of cervical region: Secondary | ICD-10-CM | POA: Diagnosis not present

## 2015-04-06 DIAGNOSIS — M5417 Radiculopathy, lumbosacral region: Secondary | ICD-10-CM | POA: Diagnosis not present

## 2015-04-06 DIAGNOSIS — M9903 Segmental and somatic dysfunction of lumbar region: Secondary | ICD-10-CM | POA: Diagnosis not present

## 2015-04-10 DIAGNOSIS — N200 Calculus of kidney: Secondary | ICD-10-CM | POA: Diagnosis not present

## 2015-04-11 DIAGNOSIS — M50322 Other cervical disc degeneration at C5-C6 level: Secondary | ICD-10-CM | POA: Diagnosis not present

## 2015-04-11 DIAGNOSIS — M9904 Segmental and somatic dysfunction of sacral region: Secondary | ICD-10-CM | POA: Diagnosis not present

## 2015-04-11 DIAGNOSIS — M50122 Cervical disc disorder at C5-C6 level with radiculopathy: Secondary | ICD-10-CM | POA: Diagnosis not present

## 2015-04-11 DIAGNOSIS — M5383 Other specified dorsopathies, cervicothoracic region: Secondary | ICD-10-CM | POA: Diagnosis not present

## 2015-04-11 DIAGNOSIS — M9903 Segmental and somatic dysfunction of lumbar region: Secondary | ICD-10-CM | POA: Diagnosis not present

## 2015-04-11 DIAGNOSIS — M9902 Segmental and somatic dysfunction of thoracic region: Secondary | ICD-10-CM | POA: Diagnosis not present

## 2015-04-11 DIAGNOSIS — M5417 Radiculopathy, lumbosacral region: Secondary | ICD-10-CM | POA: Diagnosis not present

## 2015-04-11 DIAGNOSIS — M9901 Segmental and somatic dysfunction of cervical region: Secondary | ICD-10-CM | POA: Diagnosis not present

## 2015-04-11 DIAGNOSIS — M9905 Segmental and somatic dysfunction of pelvic region: Secondary | ICD-10-CM | POA: Diagnosis not present

## 2015-04-11 DIAGNOSIS — M5137 Other intervertebral disc degeneration, lumbosacral region: Secondary | ICD-10-CM | POA: Diagnosis not present

## 2015-04-13 DIAGNOSIS — M9904 Segmental and somatic dysfunction of sacral region: Secondary | ICD-10-CM | POA: Diagnosis not present

## 2015-04-13 DIAGNOSIS — M9905 Segmental and somatic dysfunction of pelvic region: Secondary | ICD-10-CM | POA: Diagnosis not present

## 2015-04-13 DIAGNOSIS — M5383 Other specified dorsopathies, cervicothoracic region: Secondary | ICD-10-CM | POA: Diagnosis not present

## 2015-04-13 DIAGNOSIS — M9901 Segmental and somatic dysfunction of cervical region: Secondary | ICD-10-CM | POA: Diagnosis not present

## 2015-04-13 DIAGNOSIS — M5137 Other intervertebral disc degeneration, lumbosacral region: Secondary | ICD-10-CM | POA: Diagnosis not present

## 2015-04-13 DIAGNOSIS — M9902 Segmental and somatic dysfunction of thoracic region: Secondary | ICD-10-CM | POA: Diagnosis not present

## 2015-04-13 DIAGNOSIS — M5417 Radiculopathy, lumbosacral region: Secondary | ICD-10-CM | POA: Diagnosis not present

## 2015-04-13 DIAGNOSIS — M50322 Other cervical disc degeneration at C5-C6 level: Secondary | ICD-10-CM | POA: Diagnosis not present

## 2015-04-13 DIAGNOSIS — M9903 Segmental and somatic dysfunction of lumbar region: Secondary | ICD-10-CM | POA: Diagnosis not present

## 2015-04-13 DIAGNOSIS — M50122 Cervical disc disorder at C5-C6 level with radiculopathy: Secondary | ICD-10-CM | POA: Diagnosis not present

## 2015-04-18 DIAGNOSIS — M9902 Segmental and somatic dysfunction of thoracic region: Secondary | ICD-10-CM | POA: Diagnosis not present

## 2015-04-18 DIAGNOSIS — M5383 Other specified dorsopathies, cervicothoracic region: Secondary | ICD-10-CM | POA: Diagnosis not present

## 2015-04-18 DIAGNOSIS — M50122 Cervical disc disorder at C5-C6 level with radiculopathy: Secondary | ICD-10-CM | POA: Diagnosis not present

## 2015-04-18 DIAGNOSIS — M9904 Segmental and somatic dysfunction of sacral region: Secondary | ICD-10-CM | POA: Diagnosis not present

## 2015-04-18 DIAGNOSIS — M9901 Segmental and somatic dysfunction of cervical region: Secondary | ICD-10-CM | POA: Diagnosis not present

## 2015-04-18 DIAGNOSIS — M5137 Other intervertebral disc degeneration, lumbosacral region: Secondary | ICD-10-CM | POA: Diagnosis not present

## 2015-04-18 DIAGNOSIS — M50322 Other cervical disc degeneration at C5-C6 level: Secondary | ICD-10-CM | POA: Diagnosis not present

## 2015-04-18 DIAGNOSIS — M9905 Segmental and somatic dysfunction of pelvic region: Secondary | ICD-10-CM | POA: Diagnosis not present

## 2015-04-18 DIAGNOSIS — M9903 Segmental and somatic dysfunction of lumbar region: Secondary | ICD-10-CM | POA: Diagnosis not present

## 2015-04-18 DIAGNOSIS — M5417 Radiculopathy, lumbosacral region: Secondary | ICD-10-CM | POA: Diagnosis not present

## 2015-04-24 DIAGNOSIS — I10 Essential (primary) hypertension: Secondary | ICD-10-CM | POA: Diagnosis not present

## 2015-04-25 DIAGNOSIS — M5383 Other specified dorsopathies, cervicothoracic region: Secondary | ICD-10-CM | POA: Diagnosis not present

## 2015-04-25 DIAGNOSIS — M9905 Segmental and somatic dysfunction of pelvic region: Secondary | ICD-10-CM | POA: Diagnosis not present

## 2015-04-25 DIAGNOSIS — M50122 Cervical disc disorder at C5-C6 level with radiculopathy: Secondary | ICD-10-CM | POA: Diagnosis not present

## 2015-04-25 DIAGNOSIS — M9903 Segmental and somatic dysfunction of lumbar region: Secondary | ICD-10-CM | POA: Diagnosis not present

## 2015-04-25 DIAGNOSIS — M5417 Radiculopathy, lumbosacral region: Secondary | ICD-10-CM | POA: Diagnosis not present

## 2015-04-25 DIAGNOSIS — M9902 Segmental and somatic dysfunction of thoracic region: Secondary | ICD-10-CM | POA: Diagnosis not present

## 2015-04-25 DIAGNOSIS — M9901 Segmental and somatic dysfunction of cervical region: Secondary | ICD-10-CM | POA: Diagnosis not present

## 2015-04-25 DIAGNOSIS — M50322 Other cervical disc degeneration at C5-C6 level: Secondary | ICD-10-CM | POA: Diagnosis not present

## 2015-04-25 DIAGNOSIS — M9904 Segmental and somatic dysfunction of sacral region: Secondary | ICD-10-CM | POA: Diagnosis not present

## 2015-04-25 DIAGNOSIS — M5137 Other intervertebral disc degeneration, lumbosacral region: Secondary | ICD-10-CM | POA: Diagnosis not present

## 2015-04-27 DIAGNOSIS — M5137 Other intervertebral disc degeneration, lumbosacral region: Secondary | ICD-10-CM | POA: Diagnosis not present

## 2015-04-27 DIAGNOSIS — M5417 Radiculopathy, lumbosacral region: Secondary | ICD-10-CM | POA: Diagnosis not present

## 2015-04-27 DIAGNOSIS — M9902 Segmental and somatic dysfunction of thoracic region: Secondary | ICD-10-CM | POA: Diagnosis not present

## 2015-04-27 DIAGNOSIS — M50322 Other cervical disc degeneration at C5-C6 level: Secondary | ICD-10-CM | POA: Diagnosis not present

## 2015-04-27 DIAGNOSIS — M9903 Segmental and somatic dysfunction of lumbar region: Secondary | ICD-10-CM | POA: Diagnosis not present

## 2015-04-27 DIAGNOSIS — M9905 Segmental and somatic dysfunction of pelvic region: Secondary | ICD-10-CM | POA: Diagnosis not present

## 2015-04-27 DIAGNOSIS — M50122 Cervical disc disorder at C5-C6 level with radiculopathy: Secondary | ICD-10-CM | POA: Diagnosis not present

## 2015-04-27 DIAGNOSIS — M5383 Other specified dorsopathies, cervicothoracic region: Secondary | ICD-10-CM | POA: Diagnosis not present

## 2015-04-27 DIAGNOSIS — M9901 Segmental and somatic dysfunction of cervical region: Secondary | ICD-10-CM | POA: Diagnosis not present

## 2015-04-27 DIAGNOSIS — M9904 Segmental and somatic dysfunction of sacral region: Secondary | ICD-10-CM | POA: Diagnosis not present

## 2015-05-02 DIAGNOSIS — M50322 Other cervical disc degeneration at C5-C6 level: Secondary | ICD-10-CM | POA: Diagnosis not present

## 2015-05-02 DIAGNOSIS — M9901 Segmental and somatic dysfunction of cervical region: Secondary | ICD-10-CM | POA: Diagnosis not present

## 2015-05-02 DIAGNOSIS — M5417 Radiculopathy, lumbosacral region: Secondary | ICD-10-CM | POA: Diagnosis not present

## 2015-05-02 DIAGNOSIS — M9903 Segmental and somatic dysfunction of lumbar region: Secondary | ICD-10-CM | POA: Diagnosis not present

## 2015-05-02 DIAGNOSIS — M9904 Segmental and somatic dysfunction of sacral region: Secondary | ICD-10-CM | POA: Diagnosis not present

## 2015-05-02 DIAGNOSIS — M5137 Other intervertebral disc degeneration, lumbosacral region: Secondary | ICD-10-CM | POA: Diagnosis not present

## 2015-05-02 DIAGNOSIS — M9902 Segmental and somatic dysfunction of thoracic region: Secondary | ICD-10-CM | POA: Diagnosis not present

## 2015-05-02 DIAGNOSIS — M9905 Segmental and somatic dysfunction of pelvic region: Secondary | ICD-10-CM | POA: Diagnosis not present

## 2015-05-02 DIAGNOSIS — M50122 Cervical disc disorder at C5-C6 level with radiculopathy: Secondary | ICD-10-CM | POA: Diagnosis not present

## 2015-05-02 DIAGNOSIS — M5383 Other specified dorsopathies, cervicothoracic region: Secondary | ICD-10-CM | POA: Diagnosis not present

## 2015-05-04 DIAGNOSIS — M5417 Radiculopathy, lumbosacral region: Secondary | ICD-10-CM | POA: Diagnosis not present

## 2015-05-04 DIAGNOSIS — M9904 Segmental and somatic dysfunction of sacral region: Secondary | ICD-10-CM | POA: Diagnosis not present

## 2015-05-04 DIAGNOSIS — M50322 Other cervical disc degeneration at C5-C6 level: Secondary | ICD-10-CM | POA: Diagnosis not present

## 2015-05-04 DIAGNOSIS — M9903 Segmental and somatic dysfunction of lumbar region: Secondary | ICD-10-CM | POA: Diagnosis not present

## 2015-05-04 DIAGNOSIS — M9905 Segmental and somatic dysfunction of pelvic region: Secondary | ICD-10-CM | POA: Diagnosis not present

## 2015-05-04 DIAGNOSIS — M5137 Other intervertebral disc degeneration, lumbosacral region: Secondary | ICD-10-CM | POA: Diagnosis not present

## 2015-05-04 DIAGNOSIS — M50122 Cervical disc disorder at C5-C6 level with radiculopathy: Secondary | ICD-10-CM | POA: Diagnosis not present

## 2015-05-04 DIAGNOSIS — M5383 Other specified dorsopathies, cervicothoracic region: Secondary | ICD-10-CM | POA: Diagnosis not present

## 2015-05-04 DIAGNOSIS — M9902 Segmental and somatic dysfunction of thoracic region: Secondary | ICD-10-CM | POA: Diagnosis not present

## 2015-05-04 DIAGNOSIS — M9901 Segmental and somatic dysfunction of cervical region: Secondary | ICD-10-CM | POA: Diagnosis not present

## 2015-05-18 DIAGNOSIS — M9905 Segmental and somatic dysfunction of pelvic region: Secondary | ICD-10-CM | POA: Diagnosis not present

## 2015-05-18 DIAGNOSIS — M5383 Other specified dorsopathies, cervicothoracic region: Secondary | ICD-10-CM | POA: Diagnosis not present

## 2015-05-18 DIAGNOSIS — M50122 Cervical disc disorder at C5-C6 level with radiculopathy: Secondary | ICD-10-CM | POA: Diagnosis not present

## 2015-05-18 DIAGNOSIS — M50322 Other cervical disc degeneration at C5-C6 level: Secondary | ICD-10-CM | POA: Diagnosis not present

## 2015-05-18 DIAGNOSIS — M5137 Other intervertebral disc degeneration, lumbosacral region: Secondary | ICD-10-CM | POA: Diagnosis not present

## 2015-05-18 DIAGNOSIS — M5417 Radiculopathy, lumbosacral region: Secondary | ICD-10-CM | POA: Diagnosis not present

## 2015-05-18 DIAGNOSIS — M9904 Segmental and somatic dysfunction of sacral region: Secondary | ICD-10-CM | POA: Diagnosis not present

## 2015-05-18 DIAGNOSIS — M9903 Segmental and somatic dysfunction of lumbar region: Secondary | ICD-10-CM | POA: Diagnosis not present

## 2015-05-18 DIAGNOSIS — M9901 Segmental and somatic dysfunction of cervical region: Secondary | ICD-10-CM | POA: Diagnosis not present

## 2015-05-18 DIAGNOSIS — M9902 Segmental and somatic dysfunction of thoracic region: Secondary | ICD-10-CM | POA: Diagnosis not present

## 2015-08-14 DIAGNOSIS — M9901 Segmental and somatic dysfunction of cervical region: Secondary | ICD-10-CM | POA: Diagnosis not present

## 2015-08-14 DIAGNOSIS — M5417 Radiculopathy, lumbosacral region: Secondary | ICD-10-CM | POA: Diagnosis not present

## 2015-08-14 DIAGNOSIS — M9902 Segmental and somatic dysfunction of thoracic region: Secondary | ICD-10-CM | POA: Diagnosis not present

## 2015-08-14 DIAGNOSIS — M5137 Other intervertebral disc degeneration, lumbosacral region: Secondary | ICD-10-CM | POA: Diagnosis not present

## 2015-08-14 DIAGNOSIS — M5383 Other specified dorsopathies, cervicothoracic region: Secondary | ICD-10-CM | POA: Diagnosis not present

## 2015-08-14 DIAGNOSIS — M50322 Other cervical disc degeneration at C5-C6 level: Secondary | ICD-10-CM | POA: Diagnosis not present

## 2015-08-14 DIAGNOSIS — M9905 Segmental and somatic dysfunction of pelvic region: Secondary | ICD-10-CM | POA: Diagnosis not present

## 2015-08-14 DIAGNOSIS — M9904 Segmental and somatic dysfunction of sacral region: Secondary | ICD-10-CM | POA: Diagnosis not present

## 2015-08-14 DIAGNOSIS — M9903 Segmental and somatic dysfunction of lumbar region: Secondary | ICD-10-CM | POA: Diagnosis not present

## 2015-08-14 DIAGNOSIS — M50122 Cervical disc disorder at C5-C6 level with radiculopathy: Secondary | ICD-10-CM | POA: Diagnosis not present

## 2015-11-03 ENCOUNTER — Other Ambulatory Visit: Payer: Self-pay | Admitting: Urology

## 2015-11-03 ENCOUNTER — Encounter (HOSPITAL_COMMUNITY): Payer: Self-pay | Admitting: *Deleted

## 2015-11-03 NOTE — Progress Notes (Signed)
Spoke to patient via phone,history obtained,updated.  Bring blue folder,insurance cards,picture ID,designated  Reinforced no aspirin(instructions to hold aspirin per your doctor), ibuprofen products 72 hours prior to procedure. No vitamins or herbal medicines 7 days prior to procedure.   Follow laxative instructions provided by urologist (office) and in blue folder. Wear easy on/off clothing and no jewelry except wedding rings and ear rings. Leave all other valuables at home.Call md office if you have a cold,sorethroat or fever.ds up to 4 hours prior to treatment ,NPO [place]  . Verbalizes understanding of instructions

## 2015-11-06 ENCOUNTER — Ambulatory Visit (HOSPITAL_COMMUNITY): Payer: Commercial Managed Care - HMO

## 2015-11-06 ENCOUNTER — Encounter (HOSPITAL_COMMUNITY): Payer: Self-pay | Admitting: *Deleted

## 2015-11-06 ENCOUNTER — Other Ambulatory Visit: Payer: Self-pay | Admitting: Urology

## 2015-11-06 ENCOUNTER — Encounter (HOSPITAL_COMMUNITY)
Admission: RE | Disposition: A | Discharge status: Home / Self Care | Payer: Self-pay | Source: Ambulatory Visit | Attending: Urology

## 2015-11-06 ENCOUNTER — Ambulatory Visit (HOSPITAL_COMMUNITY)
Admission: RE | Admit: 2015-11-06 | Discharge: 2015-11-06 | Disposition: A | Discharge status: Home / Self Care | Payer: Commercial Managed Care - HMO | Source: Ambulatory Visit | Attending: Urology | Admitting: Urology

## 2015-11-06 DIAGNOSIS — Z7982 Long term (current) use of aspirin: Secondary | ICD-10-CM | POA: Insufficient documentation

## 2015-11-06 DIAGNOSIS — N202 Calculus of kidney with calculus of ureter: Secondary | ICD-10-CM | POA: Diagnosis not present

## 2015-11-06 DIAGNOSIS — N200 Calculus of kidney: Secondary | ICD-10-CM | POA: Diagnosis present

## 2015-11-06 DIAGNOSIS — Z79899 Other long term (current) drug therapy: Secondary | ICD-10-CM | POA: Insufficient documentation

## 2015-11-06 DIAGNOSIS — Z538 Procedure and treatment not carried out for other reasons: Secondary | ICD-10-CM | POA: Diagnosis not present

## 2015-11-06 DIAGNOSIS — N201 Calculus of ureter: Secondary | ICD-10-CM

## 2015-11-06 DIAGNOSIS — Z7984 Long term (current) use of oral hypoglycemic drugs: Secondary | ICD-10-CM | POA: Diagnosis not present

## 2015-11-06 DIAGNOSIS — N23 Unspecified renal colic: Secondary | ICD-10-CM | POA: Insufficient documentation

## 2015-11-06 HISTORY — DX: Cerebral infarction, unspecified: I63.9

## 2015-11-06 LAB — GLUCOSE, CAPILLARY: Glucose-Capillary: 152 mg/dL — ABNORMAL HIGH (ref 65–99)

## 2015-11-06 SURGERY — LITHOTRIPSY, ESWL
Anesthesia: LOCAL | Laterality: Left

## 2015-11-06 MED ORDER — DIPHENHYDRAMINE HCL 25 MG PO CAPS
25.0000 mg | ORAL_CAPSULE | ORAL | Status: AC
Start: 2015-11-06 — End: 2015-11-06
  Administered 2015-11-06: 25 mg via ORAL
  Filled 2015-11-06: qty 1

## 2015-11-06 MED ORDER — DIAZEPAM 5 MG PO TABS
10.0000 mg | ORAL_TABLET | ORAL | Status: AC
  Administered 2015-11-06: 10 mg via ORAL
  Filled 2015-11-06: qty 2

## 2015-11-06 MED ORDER — CIPROFLOXACIN HCL 500 MG PO TABS
500.0000 mg | ORAL_TABLET | ORAL | Status: AC
  Administered 2015-11-06: 500 mg via ORAL
  Filled 2015-11-06: qty 1

## 2015-11-06 MED ORDER — SODIUM CHLORIDE 0.9 % IV SOLN
INTRAVENOUS | Status: DC
  Administered 2015-11-06: 10:00:00 via INTRAVENOUS

## 2015-11-06 NOTE — H&P (Signed)
Daniel Holland  MRN: [id number]  PRIMARY CARE:  Edwyna Shell. Jacelyn Grip, MD  DOB: 19-Sep-1950, 65 year old Male  REFERRING:    SSN: [id number]  PROVIDER:  Jiles Crocker    LOCATION:  [facility] [phone]   --------------------------------------------------------------------------------   CC: I have kidney stones.  HPI: Daniel Holland is a 65 year-old male established patient who is here for renal calculi.  PMH of stones. Symptoms began this AM. He denies fevers or changes in LUTS.   The problem is on the left side. He first stated noticing pain on 11/03/2015. He is currently having back pain and nausea. He denies having flank pain, groin pain, vomiting, fever, and chills. He has not caught a stone in his urine strainer since his symptoms began.   He has had eswl, ureteral stent, and ureteroscopy for treatment of his stones in the past.     CC: I have pain in the kidney.  HPI: The problem is on the left side. His pain started about 11/03/2015. The pain is sharp. The pain is constant. The pain does not radiate.   None< makes the pain better. Sitting makes the pain worse. He has not been treated with any pain medications.   He has had this same pain previously. He has had kidney stones.     ALLERGIES: Percocet TABS Vicodin TABS    MEDICATIONS: AmLODIPine Besylate 5 MG Oral Tablet 0 Oral  Atorvastatin Calcium 20 MG Oral Tablet Oral  Baby Aspirin 81 MG CHEW Oral  MetFORMIN HCl - 500 MG Oral Tablet Oral  Vitamin B12 TABS Oral     GU PSH: Cysto Uretero Lithotripsy - 2014 Cysto Uretero Remove Stone - 2014 Cystoscopy Insert Stent - 02/16/2014, 2014 Renal ESWL - 03/22/2014      PSH Notes: Lithotripsy, Cystoscopy With Insertion Of Ureteral Stent Left, Cystoscopy With Ureteroscopy With Lithotripsy, Cystoscopy With Ureteroscopy With Removal Of Calculus, Cystoscopy With Insertion Of Ureteral Stent Right, Elective Circumcision   NON-GU PSH: No Non-GU PSH    GU PMH: Kidney Stone,  Nephrolithiasis - 04/10/2015 Calculus Ureter, Calculus of ureter - 2014    NON-GU PMH: Encounter for general adult medical examination without abnormal findings, Encounter for preventive health examination - 04/10/2015 Gastric ulcer, unspecified as acute or chronic, without hemorrhage or perforation, Gastric Ulcer - 2014 Personal history of other diseases of the circulatory system, History of hypertension - 2014    FAMILY HISTORY: No Significant Family History - Mother   SOCIAL HISTORY: No Social History     Notes: Never smoker, Marital History - Divorced, Alcohol Use, Occupation:, Caffeine Use   REVIEW OF SYSTEMS:    GU Review Male:   Patient denies frequent urination, hard to postpone urination, burning/ pain with urination, get up at night to urinate, leakage of urine, stream starts and stops, trouble starting your stream, have to strain to urinate , erection problems, and penile pain.  Gastrointestinal (Upper):   Patient reports nausea. Patient denies vomiting and indigestion/ heartburn.  Gastrointestinal (Lower):   Patient denies diarrhea and constipation.  Constitutional:   Patient denies fever, night sweats, weight loss, and fatigue.  Skin:   Patient denies skin rash/ lesion and itching.  Eyes:   Patient denies blurred vision and double vision.  Ears/ Nose/ Throat:   Patient denies sore throat and sinus problems.  Hematologic/Lymphatic:   Patient denies swollen glands and easy bruising.  Cardiovascular:   Patient denies leg swelling and chest pains.  Respiratory:  Patient denies cough and shortness of breath.  Endocrine:   Patient denies excessive thirst.  Musculoskeletal:   Patient reports back pain. Patient denies joint pain.  Neurological:   Patient denies headaches and dizziness.  Psychologic:   Patient denies depression and anxiety.   VITAL SIGNS:    Weight: 191 lb/86.6 kg   BP: 129/74 mmHg   Heart Rate: 90 /min   Temp: 98.0 F / 37 C      MULTI-SYSTEM PHYSICAL  EXAMINATION:    Constitutional: Well-nourished. No physical deformities. Normally developed. Good grooming.  Neck: Neck symmetrical, not swollen. Normal tracheal position.  Respiratory: No labored breathing, no use of accessory muscles.   Cardiovascular: Normal temperature, normal extremity pulses, no swelling, no varicosities.  Lymphatic: No enlargement of neck, axillae, groin.  Skin: No paleness, no jaundice, no cyanosis. No lesion, no ulcer, no rash.  Neurologic / Psychiatric: Oriented to time, oriented to place, oriented to person. No depression, no anxiety, no agitation.  Gastrointestinal: No mass, no tenderness, no rigidity, non obese abdomen.  Eyes: Normal conjunctivae. Normal eyelids.  Ears, Nose, Mouth, and Throat: Left ear no scars, no lesions, no masses. Right ear no scars, no lesions, no masses. Nose no scars, no lesions, no masses. Normal hearing. Normal lips.  Musculoskeletal: Tenderness of spine, ribs, pelvis, left CVA tenderness. Normal gait and station of head and neck.   PAST DATA REVIEWED:  Source Of History:  Patient  Records Review:   Previous Patient Records  Urine Test Review:   Urinalysis  X-Ray Review: KUB: Reviewed Films.     11/03/15  Urinalysis  Urine Appearance Cloudy   Urine Specimen Voided   Urine Color Amber   Urine Glucose Neg   Urine Bilirubin Neg   Urine Ketones Neg   Urine Specific Gravity 1.015   Urine Blood 3+   Urine pH 7.0   Urine Protein Neg   Urine Nitrites Neg   Urine Leukocyte Esterase Neg   Urine WBC/hpf 0-5   Urine RBC/hpf >60   Urine Epithelial Cells 0-5   Urine Bacteria Neg   Urine Fine Grain Casts/lpf Neg   Urine Coarse Grain Casts/lpf Neg   Urine Hyaline Casts/lpf Neg   Urine Mucous Neg   Urine Yeast Neg   Urine Trichomonas Neg   Notes:                     UA >60 RBC/hpf. Sent for c/s   PROCEDURES:         KUB - 74000  A single view of the abdomen is obtained.  Bony Abnormalities:  Pelvic blasts.  Fecal Stasis:  Mild  fecal stasis.  Calculi:  Proximal left ureter calculi. Left Ureteral calcification size: ___3.6___.               Urinalysis w/Scope - 81001 Dipstick Dipstick Cont'd Micro  Specimen: Voided Bilirubin: Neg WBC/hpf: 0-5  Color: Amber Ketones: Neg RBC/hpf: >60  Appearance: Cloudy Blood: 3+ Bacteria: Neg  Specific Gravity: 1.015 Protein: Neg Trichomonas: Neg  pH: 7.0 Nitrites: Neg Mucous: Neg  Glucose: Neg Leukocyte Esterase: Neg Epithelial Cells: 0-5      Yeast: Neg    ASSESSMENT:      ICD-10 Details  1 GU:   Calculus Ureter - A999333   2   Renal Colic - [id number]    PLAN:            Medications New Meds: Hydrocodone-Acetaminophen 5 mg-325 mg tablet 1 tablet PO Q  4 H PRN   #20  0 Refill(s)  Promethazine Hcl 25 mg tablet 1 tablet PO Q 6 H PRN   #20  0 Refill(s)  Tramadol Hcl 50 mg tablet 1 tablet PO Q 4 H PRN   #20  0 Refill(s)            Orders Labs Urine Culture and Sensitivity          Schedule Return Visit: Next Available Appointment - Schedule Surgery          Document Letter(s):  Created for Patient: Clinical Summary         Notes:   C/S urine.   I'll get him set up for ESWL on Monday. I discussed the risks and benefits of the procedure. He has had ESWL before.   I prescribed tramadol and Vicodin. He typically has N/V with opiate based pain medications so Phenergan was rx'd to be taken as an adjunct to that therapy. He understands to avoid NSAIDS and ASA.

## 2015-11-06 NOTE — Progress Notes (Signed)
Lithotripsy procedure canceled due to mechanical issues. Procedure rescheduled for Monday, June 19th. Patient received valium, no IV sedation. Patient discharged with friend.

## 2015-11-06 NOTE — Discharge Instructions (Signed)
Lithotripsy, Care After °Refer to this sheet in the next few weeks. These instructions provide you with information on caring for yourself after your procedure. Your health care provider may also give you more specific instructions. Your treatment has been planned according to current medical practices, but problems sometimes occur. Call your health care provider if you have any problems or questions after your procedure. °WHAT TO EXPECT AFTER THE PROCEDURE  °· Your urine may have a red tinge for a few days after treatment. Blood loss is usually minimal. °· You may have soreness in the back or flank area. This usually goes away after a few days. The procedure can cause blotches or bruises on the back where the pressure wave enters the skin. These marks usually cause only minimal discomfort and should disappear in a short time. °· Stone fragments should begin to pass within 24 hours of treatment. However, a delayed passage is not unusual. °· You may have pain, discomfort, and feel sick to your stomach (nauseated) when the crushed fragments of stone are passed down the tube from the kidney to the bladder. Stone fragments can pass soon after the procedure and may last for up to 4-8 weeks. °· A small number of patients may have severe pain when stone fragments are not able to pass, which leads to an obstruction. °· If your stone is greater than 1 inch (2.5 cm) in diameter or if you have multiple stones that have a combined diameter greater than 1 inch (2.5 cm), you may require more than one treatment. °· If you had a stent placed prior to your procedure, you may experience some discomfort, especially during urination. You may experience the pain or discomfort in your flank or back, or you may experience a sharp pain or discomfort at the base of your penis or in your lower abdomen. The discomfort usually lasts only a few minutes after urinating. °HOME CARE INSTRUCTIONS  °· Rest at home until you feel your energy  improving. °· Only take over-the-counter or prescription medicines for pain, discomfort, or fever as directed by your health care provider. Depending on the type of lithotripsy, you may need to take antibiotics and anti-inflammatory medicines for a few days. °· Drink enough water and fluids to keep your urine clear or pale yellow. This helps "flush" your kidneys. It helps pass any remaining pieces of stone and prevents stones from coming back. °· Most people can resume daily activities within 1-2 days after standard lithotripsy. It can take longer to recover from laser and percutaneous lithotripsy. °· Strain all urine through the provided strainer. Keep all particulate matter and stones for your health care provider to see. The stone may be as small as a grain of salt. It is very important to use the strainer each and every time you pass your urine. Any stones that are found can be sent to a medical lab for examination. °· Visit your health care provider for a follow-up appointment in a few weeks. Your doctor may remove your stent if you have one. Your health care provider will also check to see whether stone particles still remain. °SEEK MEDICAL CARE IF:  °· Your pain is not relieved by medicine. °· You have a lasting nauseous feeling. °· You feel there is too much blood in the urine. °· You develop persistent problems with frequent or painful urination that does not at least partially improve after 2 days following the procedure. °· You have a congested cough. °· You feel   lightheaded.  You develop a rash or any other signs that might suggest an allergic problem.  You develop any reaction or side effects to your medicine(s). SEEK IMMEDIATE MEDICAL CARE IF:   You experience severe back or flank pain or both.  You see nothing but blood when you urinate.  You cannot pass any urine at all.  You have a fever or shaking chills.  You develop shortness of breath, difficulty breathing, or chest pain.  You  develop vomiting that will not stop after 6-8 hours.  You have a fainting episode.   This information is not intended to replace advice given to you by your health care provider. Make sure you discuss any questions you have with your health care provider.   Document Released: 06/02/2007 Document Revised: 02/01/2015 Document Reviewed: 11/26/2012 Elsevier Interactive Patient Education 2016 [place].  Moderate Conscious Sedation, Adult, Care After Refer to this sheet in the next few weeks. These instructions provide you with information on caring for yourself after your procedure. Your health care provider may also give you more specific instructions. Your treatment has been planned according to current medical practices, but problems sometimes occur. Call your health care provider if you have any problems or questions after your procedure. WHAT TO EXPECT AFTER THE PROCEDURE  After your procedure: You may feel sleepy, clumsy, and have poor balance for several hours. Vomiting may occur if you eat too soon after the procedure. HOME CARE INSTRUCTIONS Do not participate in any activities where you could become injured for at least 24 hours. Do not: Drive. Swim. Ride a bicycle. Operate heavy machinery. Cook. Use power tools. Climb ladders. Work from a high place. Do not make important decisions or sign legal documents until you are improved. If you vomit, drink water, juice, or soup when you can drink without vomiting. Make sure you have little or no nausea before eating solid foods. Only take over-the-counter or prescription medicines for pain, discomfort, or fever as directed by your health care provider. Make sure you and your family fully understand everything about the medicines given to you, including what side effects may occur. You should not drink alcohol, take sleeping pills, or take medicines that cause drowsiness for at least 24 hours. If you smoke, do not smoke without  supervision. If you are feeling better, you may resume normal activities 24 hours after you were sedated. Keep all appointments with your health care provider. SEEK MEDICAL CARE IF: Your skin is pale or bluish in color. You continue to feel nauseous or vomit. Your pain is getting worse and is not helped by medicine. You have bleeding or swelling. You are still sleepy or feeling clumsy after 24 hours. SEEK IMMEDIATE MEDICAL CARE IF: You develop a rash. You have difficulty breathing. You develop any type of allergic problem. You have a fever. MAKE SURE YOU: Understand these instructions. Will watch your condition. Will get help right away if you are not doing well or get worse.   This information is not intended to replace advice given to you by your health care provider. Make sure you discuss any questions you have with your health care provider.   Document Released: 03/03/2013 Document Revised: 06/03/2014 Document Reviewed: 03/03/2013 Elsevier Interactive Patient Education 2016 [place]. Moderate Conscious Sedation, Adult, Care After Refer to this sheet in the next few weeks. These instructions provide you with information on caring for yourself after your procedure. Your health care provider may also give you more specific instructions. Your  treatment has been planned according to current medical practices, but problems sometimes occur. Call your health care provider if you have any problems or questions after your procedure. WHAT TO EXPECT AFTER THE PROCEDURE  After your procedure:  You may feel sleepy, clumsy, and have poor balance for several hours.  Vomiting may occur if you eat too soon after the procedure. HOME CARE INSTRUCTIONS  Do not participate in any activities where you could become injured for at least 24 hours. Do not:  Drive.  Swim.  Ride a bicycle.  Operate heavy machinery.  Cook.  Use power tools.  Climb ladders.  Work from a high place.  Do  not make important decisions or sign legal documents until you are improved.  If you vomit, drink water, juice, or soup when you can drink without vomiting. Make sure you have little or no nausea before eating solid foods.  Only take over-the-counter or prescription medicines for pain, discomfort, or fever as directed by your health care provider.  Make sure you and your family fully understand everything about the medicines given to you, including what side effects may occur.  You should not drink alcohol, take sleeping pills, or take medicines that cause drowsiness for at least 24 hours.  If you smoke, do not smoke without supervision.  If you are feeling better, you may resume normal activities 24 hours after you were sedated.  Keep all appointments with your health care provider. SEEK MEDICAL CARE IF:  Your skin is pale or bluish in color.  You continue to feel nauseous or vomit.  Your pain is getting worse and is not helped by medicine.  You have bleeding or swelling.  You are still sleepy or feeling clumsy after 24 hours. SEEK IMMEDIATE MEDICAL CARE IF:  You develop a rash.  You have difficulty breathing.  You develop any type of allergic problem.  You have a fever. MAKE SURE YOU:  Understand these instructions.  Will watch your condition.  Will get help right away if you are not doing well or get worse.   This information is not intended to replace advice given to you by your health care provider. Make sure you discuss any questions you have with your health care provider.   Document Released: 03/03/2013 Document Revised: 06/03/2014 Document Reviewed: 03/03/2013 Elsevier Interactive Patient Education [employer].

## 2015-11-13 ENCOUNTER — Ambulatory Visit (HOSPITAL_COMMUNITY): Admission: RE | Admit: 2015-11-13 | Payer: Commercial Managed Care - HMO | Source: Ambulatory Visit | Admitting: Urology

## 2015-11-13 SURGERY — LITHOTRIPSY, ESWL
Anesthesia: LOCAL | Laterality: Left

## 2016-06-21 DIAGNOSIS — I639 Cerebral infarction, unspecified: Secondary | ICD-10-CM | POA: Diagnosis not present

## 2016-06-21 DIAGNOSIS — E7800 Pure hypercholesterolemia, unspecified: Secondary | ICD-10-CM | POA: Diagnosis not present

## 2016-06-21 DIAGNOSIS — E663 Overweight: Secondary | ICD-10-CM | POA: Diagnosis not present

## 2016-06-21 DIAGNOSIS — I10 Essential (primary) hypertension: Secondary | ICD-10-CM | POA: Diagnosis not present

## 2016-06-21 DIAGNOSIS — Z125 Encounter for screening for malignant neoplasm of prostate: Secondary | ICD-10-CM | POA: Diagnosis not present

## 2016-06-21 DIAGNOSIS — E559 Vitamin D deficiency, unspecified: Secondary | ICD-10-CM | POA: Diagnosis not present

## 2016-06-21 DIAGNOSIS — E119 Type 2 diabetes mellitus without complications: Secondary | ICD-10-CM | POA: Diagnosis not present

## 2016-06-21 DIAGNOSIS — Z6829 Body mass index (BMI) 29.0-29.9, adult: Secondary | ICD-10-CM | POA: Diagnosis not present

## 2016-10-31 DIAGNOSIS — Z6830 Body mass index (BMI) 30.0-30.9, adult: Secondary | ICD-10-CM | POA: Diagnosis not present

## 2016-10-31 DIAGNOSIS — E7800 Pure hypercholesterolemia, unspecified: Secondary | ICD-10-CM | POA: Diagnosis not present

## 2016-10-31 DIAGNOSIS — E119 Type 2 diabetes mellitus without complications: Secondary | ICD-10-CM | POA: Diagnosis not present

## 2016-10-31 DIAGNOSIS — Z7984 Long term (current) use of oral hypoglycemic drugs: Secondary | ICD-10-CM | POA: Diagnosis not present

## 2016-10-31 DIAGNOSIS — E559 Vitamin D deficiency, unspecified: Secondary | ICD-10-CM | POA: Diagnosis not present

## 2016-10-31 DIAGNOSIS — I639 Cerebral infarction, unspecified: Secondary | ICD-10-CM | POA: Diagnosis not present

## 2016-10-31 DIAGNOSIS — I10 Essential (primary) hypertension: Secondary | ICD-10-CM | POA: Diagnosis not present

## 2016-10-31 DIAGNOSIS — E663 Overweight: Secondary | ICD-10-CM | POA: Diagnosis not present

## 2016-11-20 DIAGNOSIS — H524 Presbyopia: Secondary | ICD-10-CM | POA: Diagnosis not present

## 2017-03-03 DIAGNOSIS — Z23 Encounter for immunization: Secondary | ICD-10-CM | POA: Diagnosis not present

## 2017-03-03 DIAGNOSIS — I10 Essential (primary) hypertension: Secondary | ICD-10-CM | POA: Diagnosis not present

## 2017-03-03 DIAGNOSIS — E119 Type 2 diabetes mellitus without complications: Secondary | ICD-10-CM | POA: Diagnosis not present

## 2017-03-03 DIAGNOSIS — E663 Overweight: Secondary | ICD-10-CM | POA: Diagnosis not present

## 2017-03-03 DIAGNOSIS — R69 Illness, unspecified: Secondary | ICD-10-CM | POA: Diagnosis not present

## 2017-03-03 DIAGNOSIS — Z6830 Body mass index (BMI) 30.0-30.9, adult: Secondary | ICD-10-CM | POA: Diagnosis not present

## 2017-03-03 DIAGNOSIS — R600 Localized edema: Secondary | ICD-10-CM | POA: Diagnosis not present

## 2017-03-03 DIAGNOSIS — E7800 Pure hypercholesterolemia, unspecified: Secondary | ICD-10-CM | POA: Diagnosis not present

## 2017-03-03 DIAGNOSIS — E559 Vitamin D deficiency, unspecified: Secondary | ICD-10-CM | POA: Diagnosis not present

## 2017-03-03 DIAGNOSIS — I639 Cerebral infarction, unspecified: Secondary | ICD-10-CM | POA: Diagnosis not present

## 2017-07-04 DIAGNOSIS — E7800 Pure hypercholesterolemia, unspecified: Secondary | ICD-10-CM | POA: Diagnosis not present

## 2017-07-04 DIAGNOSIS — E663 Overweight: Secondary | ICD-10-CM | POA: Diagnosis not present

## 2017-07-04 DIAGNOSIS — Z7984 Long term (current) use of oral hypoglycemic drugs: Secondary | ICD-10-CM | POA: Diagnosis not present

## 2017-07-04 DIAGNOSIS — E559 Vitamin D deficiency, unspecified: Secondary | ICD-10-CM | POA: Diagnosis not present

## 2017-07-04 DIAGNOSIS — Z6830 Body mass index (BMI) 30.0-30.9, adult: Secondary | ICD-10-CM | POA: Diagnosis not present

## 2017-07-04 DIAGNOSIS — E1165 Type 2 diabetes mellitus with hyperglycemia: Secondary | ICD-10-CM | POA: Diagnosis not present

## 2017-07-04 DIAGNOSIS — I10 Essential (primary) hypertension: Secondary | ICD-10-CM | POA: Diagnosis not present

## 2017-07-04 DIAGNOSIS — R600 Localized edema: Secondary | ICD-10-CM | POA: Diagnosis not present

## 2017-07-04 DIAGNOSIS — I639 Cerebral infarction, unspecified: Secondary | ICD-10-CM | POA: Diagnosis not present

## 2017-07-04 DIAGNOSIS — E119 Type 2 diabetes mellitus without complications: Secondary | ICD-10-CM | POA: Diagnosis not present

## 2017-08-01 DIAGNOSIS — I10 Essential (primary) hypertension: Secondary | ICD-10-CM | POA: Diagnosis not present

## 2017-08-01 DIAGNOSIS — E7800 Pure hypercholesterolemia, unspecified: Secondary | ICD-10-CM | POA: Diagnosis not present

## 2017-08-01 DIAGNOSIS — I639 Cerebral infarction, unspecified: Secondary | ICD-10-CM | POA: Diagnosis not present

## 2017-08-01 DIAGNOSIS — E119 Type 2 diabetes mellitus without complications: Secondary | ICD-10-CM | POA: Diagnosis not present

## 2017-08-01 DIAGNOSIS — Z7984 Long term (current) use of oral hypoglycemic drugs: Secondary | ICD-10-CM | POA: Diagnosis not present

## 2017-08-01 DIAGNOSIS — R600 Localized edema: Secondary | ICD-10-CM | POA: Diagnosis not present

## 2017-11-25 DIAGNOSIS — H524 Presbyopia: Secondary | ICD-10-CM | POA: Diagnosis not present

## 2017-11-26 DIAGNOSIS — Z0100 Encounter for examination of eyes and vision without abnormal findings: Secondary | ICD-10-CM | POA: Diagnosis not present

## 2017-12-01 DIAGNOSIS — E7800 Pure hypercholesterolemia, unspecified: Secondary | ICD-10-CM | POA: Diagnosis not present

## 2017-12-01 DIAGNOSIS — I10 Essential (primary) hypertension: Secondary | ICD-10-CM | POA: Diagnosis not present

## 2017-12-01 DIAGNOSIS — R600 Localized edema: Secondary | ICD-10-CM | POA: Diagnosis not present

## 2017-12-01 DIAGNOSIS — E119 Type 2 diabetes mellitus without complications: Secondary | ICD-10-CM | POA: Diagnosis not present

## 2017-12-01 DIAGNOSIS — I639 Cerebral infarction, unspecified: Secondary | ICD-10-CM | POA: Diagnosis not present

## 2017-12-08 DIAGNOSIS — I639 Cerebral infarction, unspecified: Secondary | ICD-10-CM | POA: Diagnosis not present

## 2017-12-08 DIAGNOSIS — E119 Type 2 diabetes mellitus without complications: Secondary | ICD-10-CM | POA: Diagnosis not present

## 2017-12-08 DIAGNOSIS — I10 Essential (primary) hypertension: Secondary | ICD-10-CM | POA: Diagnosis not present

## 2017-12-08 DIAGNOSIS — R600 Localized edema: Secondary | ICD-10-CM | POA: Diagnosis not present

## 2017-12-08 DIAGNOSIS — E7800 Pure hypercholesterolemia, unspecified: Secondary | ICD-10-CM | POA: Diagnosis not present

## 2018-01-05 DIAGNOSIS — R69 Illness, unspecified: Secondary | ICD-10-CM | POA: Diagnosis not present

## 2018-05-26 DIAGNOSIS — R600 Localized edema: Secondary | ICD-10-CM | POA: Diagnosis not present

## 2018-05-26 DIAGNOSIS — I639 Cerebral infarction, unspecified: Secondary | ICD-10-CM | POA: Diagnosis not present

## 2018-05-26 DIAGNOSIS — E7800 Pure hypercholesterolemia, unspecified: Secondary | ICD-10-CM | POA: Diagnosis not present

## 2018-05-26 DIAGNOSIS — L821 Other seborrheic keratosis: Secondary | ICD-10-CM | POA: Diagnosis not present

## 2018-05-26 DIAGNOSIS — I10 Essential (primary) hypertension: Secondary | ICD-10-CM | POA: Diagnosis not present

## 2018-05-26 DIAGNOSIS — E119 Type 2 diabetes mellitus without complications: Secondary | ICD-10-CM | POA: Diagnosis not present

## 2018-06-09 DIAGNOSIS — R600 Localized edema: Secondary | ICD-10-CM | POA: Diagnosis not present

## 2018-06-09 DIAGNOSIS — I639 Cerebral infarction, unspecified: Secondary | ICD-10-CM | POA: Diagnosis not present

## 2018-06-09 DIAGNOSIS — E119 Type 2 diabetes mellitus without complications: Secondary | ICD-10-CM | POA: Diagnosis not present

## 2018-06-09 DIAGNOSIS — I10 Essential (primary) hypertension: Secondary | ICD-10-CM | POA: Diagnosis not present

## 2018-06-09 DIAGNOSIS — L821 Other seborrheic keratosis: Secondary | ICD-10-CM | POA: Diagnosis not present

## 2018-06-09 DIAGNOSIS — E7800 Pure hypercholesterolemia, unspecified: Secondary | ICD-10-CM | POA: Diagnosis not present

## 2018-06-30 ENCOUNTER — Inpatient Hospital Stay (HOSPITAL_COMMUNITY): Payer: Medicare HMO

## 2018-06-30 ENCOUNTER — Emergency Department (HOSPITAL_COMMUNITY): Payer: Medicare HMO

## 2018-06-30 ENCOUNTER — Other Ambulatory Visit: Payer: Self-pay

## 2018-06-30 ENCOUNTER — Inpatient Hospital Stay (HOSPITAL_COMMUNITY)
Admission: EM | Admit: 2018-06-30 | Discharge: 2018-07-15 | DRG: 233 | Disposition: A | Discharge status: Rehabilitation Facility | Payer: Medicare HMO | Attending: Cardiothoracic Surgery | Admitting: Cardiothoracic Surgery

## 2018-06-30 ENCOUNTER — Encounter (HOSPITAL_COMMUNITY): Payer: Self-pay | Admitting: General Surgery

## 2018-06-30 DIAGNOSIS — Z79899 Other long term (current) drug therapy: Secondary | ICD-10-CM

## 2018-06-30 DIAGNOSIS — J939 Pneumothorax, unspecified: Secondary | ICD-10-CM | POA: Diagnosis not present

## 2018-06-30 DIAGNOSIS — R4020 Unspecified coma: Secondary | ICD-10-CM | POA: Diagnosis not present

## 2018-06-30 DIAGNOSIS — R0689 Other abnormalities of breathing: Secondary | ICD-10-CM | POA: Diagnosis not present

## 2018-06-30 DIAGNOSIS — Z951 Presence of aortocoronary bypass graft: Secondary | ICD-10-CM | POA: Diagnosis not present

## 2018-06-30 DIAGNOSIS — I4891 Unspecified atrial fibrillation: Secondary | ICD-10-CM | POA: Diagnosis not present

## 2018-06-30 DIAGNOSIS — G9340 Encephalopathy, unspecified: Secondary | ICD-10-CM | POA: Diagnosis not present

## 2018-06-30 DIAGNOSIS — L8990 Pressure ulcer of unspecified site, unspecified stage: Secondary | ICD-10-CM

## 2018-06-30 DIAGNOSIS — R32 Unspecified urinary incontinence: Secondary | ICD-10-CM | POA: Diagnosis present

## 2018-06-30 DIAGNOSIS — Z789 Other specified health status: Secondary | ICD-10-CM

## 2018-06-30 DIAGNOSIS — S2220XA Unspecified fracture of sternum, initial encounter for closed fracture: Secondary | ICD-10-CM | POA: Diagnosis present

## 2018-06-30 DIAGNOSIS — E669 Obesity, unspecified: Secondary | ICD-10-CM | POA: Diagnosis present

## 2018-06-30 DIAGNOSIS — I469 Cardiac arrest, cause unspecified: Secondary | ICD-10-CM | POA: Diagnosis not present

## 2018-06-30 DIAGNOSIS — S2243XA Multiple fractures of ribs, bilateral, initial encounter for closed fracture: Secondary | ICD-10-CM | POA: Diagnosis not present

## 2018-06-30 DIAGNOSIS — Z8673 Personal history of transient ischemic attack (TIA), and cerebral infarction without residual deficits: Secondary | ICD-10-CM | POA: Diagnosis not present

## 2018-06-30 DIAGNOSIS — S299XXA Unspecified injury of thorax, initial encounter: Secondary | ICD-10-CM | POA: Diagnosis not present

## 2018-06-30 DIAGNOSIS — R5381 Other malaise: Secondary | ICD-10-CM

## 2018-06-30 DIAGNOSIS — I2510 Atherosclerotic heart disease of native coronary artery without angina pectoris: Secondary | ICD-10-CM | POA: Diagnosis present

## 2018-06-30 DIAGNOSIS — R600 Localized edema: Secondary | ICD-10-CM | POA: Diagnosis present

## 2018-06-30 DIAGNOSIS — R001 Bradycardia, unspecified: Secondary | ICD-10-CM | POA: Diagnosis not present

## 2018-06-30 DIAGNOSIS — R402312 Coma scale, best motor response, none, at arrival to emergency department: Secondary | ICD-10-CM | POA: Diagnosis not present

## 2018-06-30 DIAGNOSIS — R579 Shock, unspecified: Secondary | ICD-10-CM | POA: Diagnosis present

## 2018-06-30 DIAGNOSIS — R0682 Tachypnea, not elsewhere classified: Secondary | ICD-10-CM

## 2018-06-30 DIAGNOSIS — Z7982 Long term (current) use of aspirin: Secondary | ICD-10-CM

## 2018-06-30 DIAGNOSIS — J9811 Atelectasis: Secondary | ICD-10-CM | POA: Diagnosis not present

## 2018-06-30 DIAGNOSIS — S199XXA Unspecified injury of neck, initial encounter: Secondary | ICD-10-CM | POA: Diagnosis not present

## 2018-06-30 DIAGNOSIS — R404 Transient alteration of awareness: Secondary | ICD-10-CM | POA: Diagnosis not present

## 2018-06-30 DIAGNOSIS — R102 Pelvic and perineal pain: Secondary | ICD-10-CM | POA: Diagnosis not present

## 2018-06-30 DIAGNOSIS — J9600 Acute respiratory failure, unspecified whether with hypoxia or hypercapnia: Secondary | ICD-10-CM

## 2018-06-30 DIAGNOSIS — E876 Hypokalemia: Secondary | ICD-10-CM | POA: Diagnosis present

## 2018-06-30 DIAGNOSIS — R7989 Other specified abnormal findings of blood chemistry: Secondary | ICD-10-CM | POA: Diagnosis not present

## 2018-06-30 DIAGNOSIS — Y92410 Unspecified street and highway as the place of occurrence of the external cause: Secondary | ICD-10-CM

## 2018-06-30 DIAGNOSIS — I214 Non-ST elevation (NSTEMI) myocardial infarction: Secondary | ICD-10-CM | POA: Diagnosis not present

## 2018-06-30 DIAGNOSIS — S3991XA Unspecified injury of abdomen, initial encounter: Secondary | ICD-10-CM | POA: Diagnosis not present

## 2018-06-30 DIAGNOSIS — I4581 Long QT syndrome: Secondary | ICD-10-CM | POA: Diagnosis not present

## 2018-06-30 DIAGNOSIS — R402212 Coma scale, best verbal response, none, at arrival to emergency department: Secondary | ICD-10-CM | POA: Diagnosis not present

## 2018-06-30 DIAGNOSIS — Z6828 Body mass index (BMI) 28.0-28.9, adult: Secondary | ICD-10-CM

## 2018-06-30 DIAGNOSIS — I371 Nonrheumatic pulmonary valve insufficiency: Secondary | ICD-10-CM | POA: Diagnosis not present

## 2018-06-30 DIAGNOSIS — G931 Anoxic brain damage, not elsewhere classified: Secondary | ICD-10-CM | POA: Diagnosis not present

## 2018-06-30 DIAGNOSIS — E1169 Type 2 diabetes mellitus with other specified complication: Secondary | ICD-10-CM

## 2018-06-30 DIAGNOSIS — I083 Combined rheumatic disorders of mitral, aortic and tricuspid valves: Secondary | ICD-10-CM | POA: Diagnosis not present

## 2018-06-30 DIAGNOSIS — R1312 Dysphagia, oropharyngeal phase: Secondary | ICD-10-CM | POA: Diagnosis not present

## 2018-06-30 DIAGNOSIS — R9431 Abnormal electrocardiogram [ECG] [EKG]: Secondary | ICD-10-CM | POA: Diagnosis not present

## 2018-06-30 DIAGNOSIS — I10 Essential (primary) hypertension: Secondary | ICD-10-CM | POA: Diagnosis not present

## 2018-06-30 DIAGNOSIS — I700 Atherosclerosis of aorta: Secondary | ICD-10-CM | POA: Diagnosis not present

## 2018-06-30 DIAGNOSIS — E118 Type 2 diabetes mellitus with unspecified complications: Secondary | ICD-10-CM

## 2018-06-30 DIAGNOSIS — D62 Acute posthemorrhagic anemia: Secondary | ICD-10-CM

## 2018-06-30 DIAGNOSIS — Z01810 Encounter for preprocedural cardiovascular examination: Secondary | ICD-10-CM | POA: Diagnosis not present

## 2018-06-30 DIAGNOSIS — Z885 Allergy status to narcotic agent status: Secondary | ICD-10-CM

## 2018-06-30 DIAGNOSIS — Z4659 Encounter for fitting and adjustment of other gastrointestinal appliance and device: Secondary | ICD-10-CM

## 2018-06-30 DIAGNOSIS — Z452 Encounter for adjustment and management of vascular access device: Secondary | ICD-10-CM | POA: Diagnosis not present

## 2018-06-30 DIAGNOSIS — J9601 Acute respiratory failure with hypoxia: Secondary | ICD-10-CM | POA: Diagnosis not present

## 2018-06-30 DIAGNOSIS — R000 Tachycardia, unspecified: Secondary | ICD-10-CM | POA: Diagnosis not present

## 2018-06-30 DIAGNOSIS — E119 Type 2 diabetes mellitus without complications: Secondary | ICD-10-CM | POA: Diagnosis present

## 2018-06-30 DIAGNOSIS — Z8249 Family history of ischemic heart disease and other diseases of the circulatory system: Secondary | ICD-10-CM

## 2018-06-30 DIAGNOSIS — S2241XA Multiple fractures of ribs, right side, initial encounter for closed fracture: Secondary | ICD-10-CM | POA: Diagnosis not present

## 2018-06-30 DIAGNOSIS — J90 Pleural effusion, not elsewhere classified: Secondary | ICD-10-CM | POA: Diagnosis not present

## 2018-06-30 DIAGNOSIS — E785 Hyperlipidemia, unspecified: Secondary | ICD-10-CM | POA: Diagnosis not present

## 2018-06-30 DIAGNOSIS — I252 Old myocardial infarction: Secondary | ICD-10-CM

## 2018-06-30 DIAGNOSIS — R402112 Coma scale, eyes open, never, at arrival to emergency department: Secondary | ICD-10-CM | POA: Diagnosis not present

## 2018-06-30 DIAGNOSIS — R1310 Dysphagia, unspecified: Secondary | ICD-10-CM | POA: Diagnosis not present

## 2018-06-30 DIAGNOSIS — S3993XA Unspecified injury of pelvis, initial encounter: Secondary | ICD-10-CM | POA: Diagnosis not present

## 2018-06-30 DIAGNOSIS — Z4682 Encounter for fitting and adjustment of non-vascular catheter: Secondary | ICD-10-CM | POA: Diagnosis not present

## 2018-06-30 DIAGNOSIS — S0990XA Unspecified injury of head, initial encounter: Secondary | ICD-10-CM | POA: Diagnosis not present

## 2018-06-30 DIAGNOSIS — Z95828 Presence of other vascular implants and grafts: Secondary | ICD-10-CM

## 2018-06-30 DIAGNOSIS — Z09 Encounter for follow-up examination after completed treatment for conditions other than malignant neoplasm: Secondary | ICD-10-CM

## 2018-06-30 DIAGNOSIS — S0083XA Contusion of other part of head, initial encounter: Secondary | ICD-10-CM | POA: Diagnosis present

## 2018-06-30 DIAGNOSIS — Z7984 Long term (current) use of oral hypoglycemic drugs: Secondary | ICD-10-CM

## 2018-06-30 HISTORY — DX: Localized edema: R60.0

## 2018-06-30 HISTORY — DX: Other seborrheic keratosis: L82.1

## 2018-06-30 HISTORY — DX: Cerebral infarction, unspecified: I63.9

## 2018-06-30 HISTORY — DX: Essential (primary) hypertension: I10

## 2018-06-30 HISTORY — DX: Type 2 diabetes mellitus without complications: E11.9

## 2018-06-30 HISTORY — DX: Hyperlipidemia, unspecified: E78.5

## 2018-06-30 LAB — CBC
HCT: 43.8 % (ref 39.0–52.0)
HCT: 43.2 % (ref 39.0–52.0)
Hemoglobin: 14.3 g/dL (ref 13.0–17.0)
Hemoglobin: 14.3 g/dL (ref 13.0–17.0)
MCH: 30.2 pg (ref 26.0–34.0)
MCH: 29.2 pg (ref 26.0–34.0)
MCHC: 32.6 g/dL (ref 30.0–36.0)
MCHC: 33.1 g/dL (ref 30.0–36.0)
MCV: 92.4 fL (ref 80.0–100.0)
MCV: 88.2 fL (ref 80.0–100.0)
Platelets: 246 10*3/uL (ref 150–400)
Platelets: 253 10*3/uL (ref 150–400)
RBC: 4.74 MIL/uL (ref 4.22–5.81)
RBC: 4.9 MIL/uL (ref 4.22–5.81)
RDW: 12.8 % (ref 11.5–15.5)
RDW: 12.7 % (ref 11.5–15.5)
WBC: 18 10*3/uL — ABNORMAL HIGH (ref 4.0–10.5)
WBC: 15.9 10*3/uL — ABNORMAL HIGH (ref 4.0–10.5)
nRBC: 0 % (ref 0.0–0.2)
nRBC: 0 % (ref 0.0–0.2)

## 2018-06-30 LAB — BASIC METABOLIC PANEL
Anion gap: 14 (ref 5–15)
Anion gap: 16 — ABNORMAL HIGH (ref 5–15)
Anion gap: 16 — ABNORMAL HIGH (ref 5–15)
BUN: 19 mg/dL (ref 8–23)
BUN: 17 mg/dL (ref 8–23)
BUN: 16 mg/dL (ref 8–23)
CHLORIDE: 104 mmol/L (ref 98–111)
CO2: 21 mmol/L — ABNORMAL LOW (ref 22–32)
CO2: 17 mmol/L — AB (ref 22–32)
CO2: 17 mmol/L — ABNORMAL LOW (ref 22–32)
CREATININE: 0.95 mg/dL (ref 0.61–1.24)
Calcium: 8.1 mg/dL — ABNORMAL LOW (ref 8.9–10.3)
Calcium: 7.6 mg/dL — ABNORMAL LOW (ref 8.9–10.3)
Calcium: 8.1 mg/dL — ABNORMAL LOW (ref 8.9–10.3)
Chloride: 107 mmol/L (ref 98–111)
Chloride: 107 mmol/L (ref 98–111)
Creatinine, Ser: 0.89 mg/dL (ref 0.61–1.24)
Creatinine, Ser: 0.95 mg/dL (ref 0.61–1.24)
GFR calc Af Amer: >60 mL/min (ref >60)
GFR calc non Af Amer: >60 mL/min (ref >60)
GFR calc non Af Amer: >60 mL/min (ref >60)
GFR calc non Af Amer: >60 mL/min (ref >60)
Glucose, Bld: 209 mg/dL — ABNORMAL HIGH (ref 70–99)
Glucose, Bld: 235 mg/dL — ABNORMAL HIGH (ref 70–99)
Glucose, Bld: 237 mg/dL — ABNORMAL HIGH (ref 70–99)
Potassium: 3.2 mmol/L — ABNORMAL LOW (ref 3.5–5.1)
Potassium: 3.2 mmol/L — ABNORMAL LOW (ref 3.5–5.1)
Potassium: 3.1 mmol/L — ABNORMAL LOW (ref 3.5–5.1)
Sodium: 139 mmol/L (ref 135–145)
Sodium: 140 mmol/L (ref 135–145)
Sodium: 140 mmol/L (ref 135–145)

## 2018-06-30 LAB — PREPARE FRESH FROZEN PLASMA
Unit division: 0
Unit division: 0

## 2018-06-30 LAB — POCT I-STAT 7, (LYTES, BLD GAS, ICA,H+H)
Acid-base deficit: 2 mmol/L (ref 0.0–2.0)
Bicarbonate: 24.8 mmol/L (ref 20.0–28.0)
Calcium, Ion: 1.1 mmol/L — ABNORMAL LOW (ref 1.15–1.40)
HCT: 41 % (ref 39.0–52.0)
Hemoglobin: 13.9 g/dL (ref 13.0–17.0)
O2 Saturation: 100 %
PCO2 ART: 44.9 mmHg (ref 32.0–48.0)
PH ART: 7.343 — AB (ref 7.350–7.450)
Patient temperature: 35.5
Potassium: 3.2 mmol/L — ABNORMAL LOW (ref 3.5–5.1)
Sodium: 141 mmol/L (ref 135–145)
TCO2: 26 mmol/L (ref 22–32)
pO2, Arterial: 271 mmHg — ABNORMAL HIGH (ref 83.0–108.0)

## 2018-06-30 LAB — COMPREHENSIVE METABOLIC PANEL
ALT: 527 U/L — ABNORMAL HIGH (ref 0–44)
ANION GAP: 17 — AB (ref 5–15)
AST: 353 U/L — ABNORMAL HIGH (ref 15–41)
Albumin: 3.8 g/dL (ref 3.5–5.0)
Alkaline Phosphatase: 55 U/L (ref 38–126)
BUN: 18 mg/dL (ref 8–23)
CHLORIDE: 104 mmol/L (ref 98–111)
CO2: 20 mmol/L — ABNORMAL LOW (ref 22–32)
Calcium: 8.5 mg/dL — ABNORMAL LOW (ref 8.9–10.3)
Creatinine, Ser: 1.22 mg/dL (ref 0.61–1.24)
GFR calc Af Amer: >60 mL/min (ref >60)
GFR calc non Af Amer: >60 mL/min (ref >60)
Glucose, Bld: 226 mg/dL — ABNORMAL HIGH (ref 70–99)
Potassium: 3.1 mmol/L — ABNORMAL LOW (ref 3.5–5.1)
Sodium: 141 mmol/L (ref 135–145)
Total Bilirubin: 0.9 mg/dL (ref 0.3–1.2)
Total Protein: 6.5 g/dL (ref 6.5–8.1)

## 2018-06-30 LAB — URINALYSIS, ROUTINE W REFLEX MICROSCOPIC
Bilirubin Urine: NEGATIVE
Glucose, UA: 50 mg/dL — AB
Ketones, ur: NEGATIVE mg/dL
Leukocytes, UA: NEGATIVE
Nitrite: NEGATIVE
Protein, ur: 100 mg/dL — AB
Specific Gravity, Urine: 1.045 — ABNORMAL HIGH (ref 1.005–1.030)
pH: 6 (ref 5.0–8.0)

## 2018-06-30 LAB — ETHANOL: Alcohol, Ethyl (B): <10 mg/dL (ref <10)

## 2018-06-30 LAB — RAPID URINE DRUG SCREEN, HOSP PERFORMED
Amphetamines: NOT DETECTED
BARBITURATES: NOT DETECTED
Benzodiazepines: NOT DETECTED
COCAINE: NOT DETECTED
Opiates: NOT DETECTED
Tetrahydrocannabinol: NOT DETECTED

## 2018-06-30 LAB — TROPONIN I: Troponin I: 0.61 ng/mL (ref <0.03)

## 2018-06-30 LAB — BPAM FFP
Blood Product Expiration Date: 202002182359
Blood Product Expiration Date: 202002182359
ISSUE DATE / TIME: 202002041349
ISSUE DATE / TIME: 202002041349
Unit Type and Rh: 6200
Unit Type and Rh: 6200

## 2018-06-30 LAB — APTT: aPTT: 27 seconds (ref 24–36)

## 2018-06-30 LAB — MRSA PCR SCREENING: MRSA by PCR: NEGATIVE

## 2018-06-30 LAB — PROTIME-INR
INR: 1.09
INR: 1.03
PROTHROMBIN TIME: 14.1 s (ref 11.4–15.2)
PROTHROMBIN TIME: 13.4 s (ref 11.4–15.2)

## 2018-06-30 LAB — ABO/RH: ABO/RH(D): O POS

## 2018-06-30 LAB — POCT ACTIVATED CLOTTING TIME: Activated Clotting Time: 0 seconds

## 2018-06-30 LAB — GLUCOSE, CAPILLARY
Glucose-Capillary: 197 mg/dL — ABNORMAL HIGH (ref 70–99)
Glucose-Capillary: 228 mg/dL — ABNORMAL HIGH (ref 70–99)
Glucose-Capillary: 239 mg/dL — ABNORMAL HIGH (ref 70–99)
Glucose-Capillary: 216 mg/dL — ABNORMAL HIGH (ref 70–99)

## 2018-06-30 LAB — LACTIC ACID, PLASMA
Lactic Acid, Venous: 6.2 mmol/L (ref 0.5–1.9)
Lactic Acid, Venous: 5.2 mmol/L (ref 0.5–1.9)

## 2018-06-30 LAB — CDS SEROLOGY

## 2018-06-30 LAB — TRIGLYCERIDES: Triglycerides: 57 mg/dL (ref <150)

## 2018-06-30 MED ORDER — ORAL CARE MOUTH RINSE
15.0000 mL | OROMUCOSAL | Status: DC
  Administered 2018-06-30 – 2018-07-03 (×26): 15 mL via OROMUCOSAL

## 2018-06-30 MED ORDER — FENTANYL BOLUS VIA INFUSION
50.0000 ug | INTRAVENOUS | Status: DC | PRN
  Administered 2018-06-30: 50 ug via INTRAVENOUS
  Filled 2018-06-30: qty 50

## 2018-06-30 MED ORDER — PANTOPRAZOLE SODIUM 40 MG IV SOLR
40.0000 mg | Freq: Every day | INTRAVENOUS | Status: DC
  Administered 2018-06-30 – 2018-07-01 (×2): 40 mg via INTRAVENOUS
  Filled 2018-06-30 (×2): qty 40

## 2018-06-30 MED ORDER — INSULIN REGULAR(HUMAN) IN NACL 100-0.9 UT/100ML-% IV SOLN
INTRAVENOUS | Status: DC
  Administered 2018-06-30: 1.7 [IU]/h via INTRAVENOUS
  Filled 2018-06-30: qty 100

## 2018-06-30 MED ORDER — LABETALOL HCL 5 MG/ML IV SOLN
10.0000 mg | INTRAVENOUS | Status: DC | PRN
  Administered 2018-06-30 – 2018-07-03 (×6): 10 mg via INTRAVENOUS
  Filled 2018-06-30 (×4): qty 4

## 2018-06-30 MED ORDER — ARTIFICIAL TEARS OPHTHALMIC OINT
1.0000 "application " | TOPICAL_OINTMENT | Freq: Three times a day (TID) | OPHTHALMIC | Status: DC
  Administered 2018-06-30 – 2018-07-02 (×6): 1 via OPHTHALMIC
  Filled 2018-06-30 (×2): qty 3.5

## 2018-06-30 MED ORDER — POTASSIUM CHLORIDE 20 MEQ/15ML (10%) PO SOLN: 40.0000 meq | Freq: Once | ORAL | Status: DC

## 2018-06-30 MED ORDER — FENTANYL CITRATE (PF) 100 MCG/2ML IJ SOLN
100.0000 ug | Freq: Once | INTRAMUSCULAR | Status: AC
  Administered 2018-06-30: 100 ug via INTRAVENOUS
  Filled 2018-06-30: qty 2

## 2018-06-30 MED ORDER — ASPIRIN 81 MG PO CHEW
81.0000 mg | CHEWABLE_TABLET | Freq: Every day | ORAL | Status: DC
  Administered 2018-07-01 – 2018-07-05 (×5): 81 mg
  Filled 2018-06-30 (×5): qty 1

## 2018-06-30 MED ORDER — CHLORHEXIDINE GLUCONATE 0.12% ORAL RINSE (MEDLINE KIT)
15.0000 mL | Freq: Two times a day (BID) | OROMUCOSAL | Status: DC
  Administered 2018-06-30 – 2018-07-03 (×6): 15 mL via OROMUCOSAL

## 2018-06-30 MED ORDER — SODIUM CHLORIDE 0.9 % IV SOLN
INTRAVENOUS | Status: DC
  Administered 2018-06-30 – 2018-07-04 (×7): via INTRAVENOUS

## 2018-06-30 MED ORDER — SODIUM CHLORIDE 0.9 % IV SOLN
3.0000 g | Freq: Four times a day (QID) | INTRAVENOUS | Status: DC
  Administered 2018-06-30 – 2018-07-04 (×15): 3 g via INTRAVENOUS
  Filled 2018-06-30 (×17): qty 3

## 2018-06-30 MED ORDER — ASPIRIN 300 MG RE SUPP
300.0000 mg | RECTAL | Status: AC
  Administered 2018-06-30: 300 mg via RECTAL
  Filled 2018-06-30: qty 1

## 2018-06-30 MED ORDER — PROPOFOL 1000 MG/100ML IV EMUL: 5.0000 ug/kg/min | INTRAVENOUS | Status: DC

## 2018-06-30 MED ORDER — MIDAZOLAM HCL 2 MG/2ML IJ SOLN
2.0000 mg | Freq: Once | INTRAMUSCULAR | Status: AC
  Administered 2018-06-30: 2 mg via INTRAVENOUS
  Filled 2018-06-30: qty 2

## 2018-06-30 MED ORDER — SODIUM CHLORIDE 0.9 % IV SOLN
3.0000 g | Freq: Once | INTRAVENOUS | Status: AC
  Administered 2018-06-30: 3 g via INTRAVENOUS
  Filled 2018-06-30: qty 3

## 2018-06-30 MED ORDER — HEPARIN SODIUM (PORCINE) 5000 UNIT/ML IJ SOLN
5000.0000 [IU] | Freq: Three times a day (TID) | INTRAMUSCULAR | Status: DC
  Administered 2018-06-30 – 2018-07-06 (×18): 5000 [IU] via SUBCUTANEOUS
  Filled 2018-06-30 (×18): qty 1

## 2018-06-30 MED ORDER — IOHEXOL 300 MG/ML  SOLN
100.0000 mL | Freq: Once | INTRAMUSCULAR | Status: AC | PRN
  Administered 2018-06-30: 100 mL via INTRAVENOUS

## 2018-06-30 MED ORDER — MIDAZOLAM BOLUS VIA INFUSION
2.0000 mg | INTRAVENOUS | Status: DC | PRN
  Administered 2018-06-30: 2 mg via INTRAVENOUS
  Filled 2018-06-30: qty 2

## 2018-06-30 MED ORDER — NOREPINEPHRINE 4 MG/250ML-% IV SOLN
0.0000 ug/min | INTRAVENOUS | Status: DC
  Administered 2018-06-30: 5 ug/min via INTRAVENOUS
  Administered 2018-07-01: 11 ug/min via INTRAVENOUS
  Filled 2018-06-30 (×2): qty 250

## 2018-06-30 MED ORDER — CISATRACURIUM BOLUS VIA INFUSION
0.1000 mg/kg | Freq: Once | INTRAVENOUS | Status: AC
  Administered 2018-06-30: 8.4 mg via INTRAVENOUS
  Filled 2018-06-30: qty 9

## 2018-06-30 MED ORDER — FENTANYL 2500MCG IN NS 250ML (10MCG/ML) PREMIX INFUSION
100.0000 ug/h | INTRAVENOUS | Status: DC
  Administered 2018-06-30: 100 ug/h via INTRAVENOUS
  Administered 2018-07-01: 150 ug/h via INTRAVENOUS
  Administered 2018-07-01: 100 ug/h via INTRAVENOUS
  Filled 2018-06-30 (×3): qty 250

## 2018-06-30 MED ORDER — POTASSIUM CHLORIDE 20 MEQ/15ML (10%) PO SOLN
80.0000 meq | Freq: Once | ORAL | Status: AC
  Administered 2018-06-30: 80 meq
  Filled 2018-06-30: qty 60

## 2018-06-30 MED ORDER — ROCURONIUM BROMIDE 50 MG/5ML IV SOLN
INTRAVENOUS | Status: AC | PRN
  Administered 2018-06-30: 100 mg via INTRAVENOUS

## 2018-06-30 MED ORDER — CISATRACURIUM BOLUS VIA INFUSION
0.0500 mg/kg | INTRAVENOUS | Status: DC | PRN
  Filled 2018-06-30: qty 5

## 2018-06-30 MED ORDER — MIDAZOLAM 50MG/50ML (1MG/ML) PREMIX INFUSION
2.0000 mg/h | INTRAVENOUS | Status: DC
  Administered 2018-06-30: 2 mg/h via INTRAVENOUS
  Administered 2018-07-01 (×2): 3 mg/h via INTRAVENOUS
  Administered 2018-07-02 (×2): 5 mg/h via INTRAVENOUS
  Filled 2018-06-30 (×5): qty 50

## 2018-06-30 MED ORDER — SODIUM CHLORIDE 0.9 % IV SOLN
INTRAVENOUS | Status: AC | PRN
  Administered 2018-06-30: 1000 mL via INTRAVENOUS

## 2018-06-30 MED ORDER — SODIUM CHLORIDE 0.9 % IV SOLN
1.0000 ug/kg/min | INTRAVENOUS | Status: DC
  Administered 2018-06-30: 1 ug/kg/min via INTRAVENOUS
  Filled 2018-06-30 (×3): qty 20

## 2018-06-30 MED ORDER — ACETAMINOPHEN 10 MG/ML IV SOLN
1000.0000 mg | Freq: Once | INTRAVENOUS | Status: AC
  Administered 2018-06-30: 1000 mg via INTRAVENOUS
  Filled 2018-06-30: qty 100

## 2018-06-30 MED ORDER — ETOMIDATE 2 MG/ML IV SOLN
INTRAVENOUS | Status: AC | PRN
  Administered 2018-06-30: 20 mg via INTRAVENOUS

## 2018-06-30 MED ORDER — INSULIN ASPART 100 UNIT/ML ~~LOC~~ SOLN
2.0000 [IU] | SUBCUTANEOUS | Status: DC
  Administered 2018-06-30: 6 [IU] via SUBCUTANEOUS
  Administered 2018-06-30: 4 [IU] via SUBCUTANEOUS

## 2018-06-30 MED ORDER — POTASSIUM CHLORIDE 10 MEQ/50ML IV SOLN
10.0000 meq | INTRAVENOUS | Status: AC
  Administered 2018-06-30 – 2018-07-01 (×4): 10 meq via INTRAVENOUS
  Filled 2018-06-30 (×4): qty 50
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
## 2018-06-30 NOTE — Procedures (Signed)
Arterial Catheter Insertion Procedure Note Lerry Cordrey [id number] 05/07/1951  Procedure: Insertion of Arterial Catheter  Indications: Blood pressure monitoring  Procedure Details Consent: Unable to obtain consent because of patient sedated on the ventilator. Time Out: Verified patient identification, verified procedure, site/side was marked, verified correct patient position, special equipment/implants available, medications/allergies/relevent history reviewed, required imaging and test results available.  Performed  Maximum sterile technique was used including antiseptics, cap, gloves, gown, hand hygiene, mask and sheet. Skin prep: Chlorhexidine; local anesthetic administered 20 gauge catheter was inserted into right radial artery using the Seldinger technique. ULTRASOUND GUIDANCE USED: NO Evaluation Blood flow good; BP tracing good. Complications: No apparent complications.   Kathie Dike 06/30/2018

## 2018-06-30 NOTE — Procedures (Signed)
Central Venous Catheter Insertion Procedure Note Yida Hyams [id number] [birth year]  Procedure: Insertion of Central Venous Catheter Indications: Assessment of intravascular volume, Drug and/or fluid administration and Frequent blood sampling  Procedure Details Consent: Unable to obtain consent because of emergent medical necessity. Time Out: Verified patient identification, verified procedure, site/side was marked, verified correct patient position, special equipment/implants available, medications/allergies/relevent history reviewed, required imaging and test results available.  Performed  Maximum sterile technique was used including antiseptics, cap, gloves, gown, hand hygiene, mask and sheet. Skin prep: Chlorhexidine; local anesthetic administered A antimicrobial bonded/coated triple lumen catheter was placed in the left subclavian vein using the Seldinger technique.  Evaluation Blood flow good Complications: No apparent complications Patient did tolerate procedure well. Chest X-ray ordered to verify placement.  CXR: pending.   Montey Hora, [facility] Pager: [phone]  or [phone] 06/30/2018, 6:02 PM
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
## 2018-06-30 NOTE — Progress Notes (Signed)
EEG completed, results pending. 

## 2018-06-30 NOTE — ED Notes (Signed)
Transported to CT 

## 2018-06-30 NOTE — Progress Notes (Signed)
Pt unavailable for an EEG at this time. Pt will be moving to [place] soon. Will attempt at a later time when schedule permits

## 2018-06-30 NOTE — Progress Notes (Signed)
[facility] Progress Note Patient Name: Daniel Holland DOB: 1950-10-12 MRN: [id number]   Date of Service  06/30/2018  HPI/Events of Note  K+ 3.2  eICU Interventions  E-link hypokalemia protocol for K+ of 3.2 ordered        Frederik Pear 06/30/2018, 9:26 PM
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
## 2018-06-30 NOTE — Progress Notes (Signed)
Responded to ED level 1  page to to support staff and patient.  Per staff there is no family.  Patient ex girlfriend was called Larena Sox [phone]).  When I spoke with her she said she didn't know any of his family. All family in [place] or [place].  Chaplain available as needed.  Jaclynn Major, [place], [facility], Pager [phone]

## 2018-06-30 NOTE — ED Notes (Signed)
Ice packs applied to pt
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
## 2018-06-30 NOTE — Progress Notes (Signed)
[facility] Progress Note Patient Name: Daniel Holland DOB: 05-07-1951 MRN: [id number]   Date of Service  06/30/2018  HPI/Events of Note  Temp up to 33.7 despite set temp of 33 without obvious cause  eICU Interventions  Ofirmev 1 gm iv x 1        Jordan Caraveo U Dameion Briles 06/30/2018, 11:06 PM

## 2018-06-30 NOTE — Progress Notes (Signed)
CRITICAL VALUE ALERT  Critical Value:  Trop  Date & Time Notified:  2/4 @ [place]  Provider Notified: PA Desai  Orders Received/Actions taken: No further orders at this time.

## 2018-07-01 ENCOUNTER — Inpatient Hospital Stay (HOSPITAL_COMMUNITY): Payer: Medicare HMO

## 2018-07-01 DIAGNOSIS — I469 Cardiac arrest, cause unspecified: Secondary | ICD-10-CM

## 2018-07-01 LAB — POCT I-STAT 4, (NA,K, GLUC, HGB,HCT)
GLUCOSE: 138 mg/dL — AB (ref 70–99)
Glucose, Bld: 146 mg/dL — ABNORMAL HIGH (ref 70–99)
HCT: 35 % — ABNORMAL LOW (ref 39.0–52.0)
HCT: 35 % — ABNORMAL LOW (ref 39.0–52.0)
Hemoglobin: 11.9 g/dL — ABNORMAL LOW (ref 13.0–17.0)
Hemoglobin: 11.9 g/dL — ABNORMAL LOW (ref 13.0–17.0)
Potassium: 3 mmol/L — ABNORMAL LOW (ref 3.5–5.1)
Potassium: 3.1 mmol/L — ABNORMAL LOW (ref 3.5–5.1)
Sodium: 139 mmol/L (ref 135–145)
Sodium: 140 mmol/L (ref 135–145)

## 2018-07-01 LAB — BPAM RBC
Blood Product Expiration Date: 202002112359
Blood Product Expiration Date: 202002112359
ISSUE DATE / TIME: 202002041540
ISSUE DATE / TIME: 202002041540
Unit Type and Rh: 5100
Unit Type and Rh: 5100

## 2018-07-01 LAB — TROPONIN I
Troponin I: 3.02 ng/mL (ref <0.03)
Troponin I: 3.45 ng/mL (ref <0.03)
Troponin I: 3.35 ng/mL (ref <0.03)
Troponin I: 3.09 ng/mL (ref <0.03)

## 2018-07-01 LAB — GLUCOSE, CAPILLARY
GLUCOSE-CAPILLARY: 208 mg/dL — AB (ref 70–99)
GLUCOSE-CAPILLARY: 168 mg/dL — AB (ref 70–99)
GLUCOSE-CAPILLARY: 129 mg/dL — AB (ref 70–99)
GLUCOSE-CAPILLARY: 127 mg/dL — AB (ref 70–99)
Glucose-Capillary: 135 mg/dL — ABNORMAL HIGH (ref 70–99)
Glucose-Capillary: 135 mg/dL — ABNORMAL HIGH (ref 70–99)
Glucose-Capillary: 137 mg/dL — ABNORMAL HIGH (ref 70–99)
Glucose-Capillary: 152 mg/dL — ABNORMAL HIGH (ref 70–99)
Glucose-Capillary: 167 mg/dL — ABNORMAL HIGH (ref 70–99)
Glucose-Capillary: 182 mg/dL — ABNORMAL HIGH (ref 70–99)
Glucose-Capillary: 210 mg/dL — ABNORMAL HIGH (ref 70–99)
Glucose-Capillary: 214 mg/dL — ABNORMAL HIGH (ref 70–99)
Glucose-Capillary: 163 mg/dL — ABNORMAL HIGH (ref 70–99)
Glucose-Capillary: 138 mg/dL — ABNORMAL HIGH (ref 70–99)
Glucose-Capillary: 155 mg/dL — ABNORMAL HIGH (ref 70–99)
Glucose-Capillary: 134 mg/dL — ABNORMAL HIGH (ref 70–99)
Glucose-Capillary: 128 mg/dL — ABNORMAL HIGH (ref 70–99)
Glucose-Capillary: 140 mg/dL — ABNORMAL HIGH (ref 70–99)
Glucose-Capillary: 111 mg/dL — ABNORMAL HIGH (ref 70–99)
Glucose-Capillary: 114 mg/dL — ABNORMAL HIGH (ref 70–99)
Glucose-Capillary: 135 mg/dL — ABNORMAL HIGH (ref 70–99)

## 2018-07-01 LAB — HEPATIC FUNCTION PANEL
ALT: 396 U/L — ABNORMAL HIGH (ref 0–44)
AST: 305 U/L — ABNORMAL HIGH (ref 15–41)
Albumin: 3.4 g/dL — ABNORMAL LOW (ref 3.5–5.0)
Alkaline Phosphatase: 40 U/L (ref 38–126)
Bilirubin, Direct: 0.3 mg/dL — ABNORMAL HIGH (ref 0.0–0.2)
Indirect Bilirubin: 0.8 mg/dL (ref 0.3–0.9)
Total Bilirubin: 1.1 mg/dL (ref 0.3–1.2)
Total Protein: 5.9 g/dL — ABNORMAL LOW (ref 6.5–8.1)

## 2018-07-01 LAB — BASIC METABOLIC PANEL
ANION GAP: 10 (ref 5–15)
Anion gap: 11 (ref 5–15)
Anion gap: 13 (ref 5–15)
Anion gap: 6 (ref 5–15)
BUN: 12 mg/dL (ref 8–23)
BUN: 14 mg/dL (ref 8–23)
BUN: 8 mg/dL (ref 8–23)
BUN: 9 mg/dL (ref 8–23)
CHLORIDE: 113 mmol/L — AB (ref 98–111)
CHLORIDE: 110 mmol/L (ref 98–111)
CO2: 18 mmol/L — ABNORMAL LOW (ref 22–32)
CO2: 20 mmol/L — AB (ref 22–32)
CO2: 18 mmol/L — ABNORMAL LOW (ref 22–32)
CO2: 17 mmol/L — AB (ref 22–32)
Calcium: 7.8 mg/dL — ABNORMAL LOW (ref 8.9–10.3)
Calcium: 7.9 mg/dL — ABNORMAL LOW (ref 8.9–10.3)
Calcium: 7.9 mg/dL — ABNORMAL LOW (ref 8.9–10.3)
Calcium: 8 mg/dL — ABNORMAL LOW (ref 8.9–10.3)
Chloride: 112 mmol/L — ABNORMAL HIGH (ref 98–111)
Chloride: 112 mmol/L — ABNORMAL HIGH (ref 98–111)
Creatinine, Ser: 0.51 mg/dL — ABNORMAL LOW (ref 0.61–1.24)
Creatinine, Ser: 0.65 mg/dL (ref 0.61–1.24)
Creatinine, Ser: 0.82 mg/dL (ref 0.61–1.24)
Creatinine, Ser: 0.94 mg/dL (ref 0.61–1.24)
GFR calc Af Amer: >60 mL/min (ref >60)
GFR calc Af Amer: >60 mL/min (ref >60)
GFR calc Af Amer: >60 mL/min (ref >60)
GFR calc Af Amer: >60 mL/min (ref >60)
GFR calc non Af Amer: >60 mL/min (ref >60)
GFR calc non Af Amer: >60 mL/min (ref >60)
GFR calc non Af Amer: >60 mL/min (ref >60)
Glucose, Bld: 134 mg/dL — ABNORMAL HIGH (ref 70–99)
Glucose, Bld: 141 mg/dL — ABNORMAL HIGH (ref 70–99)
Glucose, Bld: 151 mg/dL — ABNORMAL HIGH (ref 70–99)
Glucose, Bld: 222 mg/dL — ABNORMAL HIGH (ref 70–99)
POTASSIUM: 3.6 mmol/L (ref 3.5–5.1)
Potassium: 3.7 mmol/L (ref 3.5–5.1)
Potassium: 3.2 mmol/L — ABNORMAL LOW (ref 3.5–5.1)
Potassium: 3.7 mmol/L (ref 3.5–5.1)
Sodium: 139 mmol/L (ref 135–145)
Sodium: 140 mmol/L (ref 135–145)
Sodium: 140 mmol/L (ref 135–145)
Sodium: 141 mmol/L (ref 135–145)

## 2018-07-01 LAB — TYPE AND SCREEN
ABO/RH(D): O POS
Antibody Screen: NEGATIVE
Unit division: 0
Unit division: 0

## 2018-07-01 LAB — APTT: aPTT: 28 seconds (ref 24–36)

## 2018-07-01 LAB — PHOSPHORUS: Phosphorus: 1.5 mg/dL — ABNORMAL LOW (ref 2.5–4.6)

## 2018-07-01 LAB — ECHOCARDIOGRAM COMPLETE
Height: 68 in
Weight: 2960 oz

## 2018-07-01 LAB — MAGNESIUM: Magnesium: 1.3 mg/dL — ABNORMAL LOW (ref 1.7–2.4)

## 2018-07-01 LAB — PROTIME-INR
INR: 1.11
Prothrombin Time: 14.2 seconds (ref 11.4–15.2)

## 2018-07-01 LAB — BLOOD PRODUCT ORDER (VERBAL) VERIFICATION

## 2018-07-01 MED ORDER — NOREPINEPHRINE 16 MG/250ML-% IV SOLN
0.0000 ug/min | INTRAVENOUS | Status: DC
  Administered 2018-07-01: 4 ug/min via INTRAVENOUS
  Administered 2018-07-03: 5 ug/min via INTRAVENOUS
  Filled 2018-07-01 (×2): qty 250

## 2018-07-01 MED ORDER — PRO-STAT SUGAR FREE PO LIQD
30.0000 mL | Freq: Two times a day (BID) | ORAL | Status: DC
  Administered 2018-07-01 – 2018-07-02 (×4): 30 mL
  Filled 2018-07-01 (×4): qty 30

## 2018-07-01 MED ORDER — MAGNESIUM SULFATE 4 GM/100ML IV SOLN
4.0000 g | Freq: Once | INTRAVENOUS | Status: AC
  Administered 2018-07-01: 4 g via INTRAVENOUS
  Filled 2018-07-01: qty 100

## 2018-07-01 MED ORDER — INSULIN ASPART 100 UNIT/ML ~~LOC~~ SOLN
2.0000 [IU] | SUBCUTANEOUS | Status: DC
  Administered 2018-07-01 – 2018-07-02 (×3): 2 [IU] via SUBCUTANEOUS

## 2018-07-01 MED ORDER — VITAL AF 1.2 CAL PO LIQD: 1000.0000 mL | ORAL | Status: DC

## 2018-07-01 MED ORDER — VITAL AF 1.2 CAL PO LIQD
1000.0000 mL | ORAL | Status: DC
  Administered 2018-07-02: 1000 mL

## 2018-07-01 MED ORDER — POTASSIUM CHLORIDE 10 MEQ/50ML IV SOLN
10.0000 meq | INTRAVENOUS | Status: AC
  Administered 2018-07-01 (×4): 10 meq via INTRAVENOUS
  Filled 2018-07-01 (×4): qty 50

## 2018-07-01 MED ORDER — SODIUM CHLORIDE 0.9% FLUSH
10.0000 mL | Freq: Two times a day (BID) | INTRAVENOUS | Status: DC
  Administered 2018-07-01 – 2018-07-02 (×2): 10 mL
  Administered 2018-07-03: 30 mL
  Administered 2018-07-03 – 2018-07-05 (×4): 10 mL

## 2018-07-01 MED ORDER — INSULIN DETEMIR 100 UNIT/ML ~~LOC~~ SOLN
5.0000 [IU] | Freq: Two times a day (BID) | SUBCUTANEOUS | Status: DC
  Administered 2018-07-01: 5 [IU] via SUBCUTANEOUS
  Filled 2018-07-01 (×2): qty 0.05

## 2018-07-01 MED ORDER — CHLORHEXIDINE GLUCONATE CLOTH 2 % EX PADS
6.0000 | MEDICATED_PAD | Freq: Every day | CUTANEOUS | Status: DC
  Administered 2018-07-01 – 2018-07-04 (×4): 6 via TOPICAL

## 2018-07-01 MED ORDER — ACETAMINOPHEN 325 MG PO TABS
650.0000 mg | ORAL_TABLET | ORAL | Status: DC | PRN
  Administered 2018-07-01: 650 mg
  Filled 2018-07-01 (×2): qty 2

## 2018-07-01 MED ORDER — SODIUM CHLORIDE 0.9 % IV SOLN: Freq: Once | INTRAVENOUS | Status: DC

## 2018-07-01 MED ORDER — DEXTROSE 10 % IV SOLN: INTRAVENOUS | Status: DC | PRN

## 2018-07-01 MED ORDER — INSULIN ASPART 100 UNIT/ML ~~LOC~~ SOLN: 1.0000 [IU] | SUBCUTANEOUS | Status: DC

## 2018-07-01 MED ORDER — POTASSIUM PHOSPHATES 15 MMOLE/5ML IV SOLN
30.0000 mmol | Freq: Once | INTRAVENOUS | Status: AC
  Administered 2018-07-01: 30 mmol via INTRAVENOUS
  Filled 2018-07-01: qty 10

## 2018-07-01 MED ORDER — VITAL HIGH PROTEIN PO LIQD: 1000.0000 mL | ORAL | Status: DC

## 2018-07-01 MED ORDER — SODIUM CHLORIDE 0.9% FLUSH: 10.0000 mL | INTRAVENOUS | Status: DC | PRN
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
## 2018-07-01 NOTE — Progress Notes (Signed)
Employer of patient called to check on patient at this time because he did not show up for work. Employer arrived at patients apartment and learned patient was at hospital. Employer tracked patient to [facility]. Employer is going to visit patient today. RN did not disclose any personal information at this time. RN will continue to monitor.

## 2018-07-01 NOTE — Progress Notes (Signed)
  Echocardiogram 2D Echocardiogram has been performed.  Daniel Holland 07/01/2018, 12:36 PM
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
## 2018-07-01 NOTE — Progress Notes (Signed)
[place] Progress Note Patient Name: Daniel Holland DOB: March 26, 1951 MRN: [id number]   Date of Service  07/01/2018  HPI/Events of Note  Tube feed order needs clarification  eICU Interventions  Order clarified.        Kerry Kass Rabecca Birge 07/01/2018, 8:51 PM

## 2018-07-01 NOTE — Progress Notes (Signed)
[place] Progress Note Patient Name: Daniel Holland DOB: 08-13-1950 MRN: [id number]   Date of Service  07/01/2018  HPI/Events of Note  Bradycardia likely related to cooling and exacerbated by Fentanyl infusion. BP currently 95/61 with a MAP of 71  eICU Interventions  Continue to observe for now, 500 ml iv Saline bolus for MAP < 65, cycle cardiac enzymes        Katalyna Socarras U Vaiden Adames 07/01/2018, 2:40 AM

## 2018-07-02 ENCOUNTER — Encounter (HOSPITAL_COMMUNITY): Payer: Self-pay | Admitting: *Deleted

## 2018-07-02 ENCOUNTER — Inpatient Hospital Stay (HOSPITAL_COMMUNITY): Payer: Medicare HMO

## 2018-07-02 ENCOUNTER — Other Ambulatory Visit: Payer: Self-pay

## 2018-07-02 LAB — CBC
HCT: 37.6 % — ABNORMAL LOW (ref 39.0–52.0)
Hemoglobin: 12.9 g/dL — ABNORMAL LOW (ref 13.0–17.0)
MCH: 29.4 pg (ref 26.0–34.0)
MCHC: 34.3 g/dL (ref 30.0–36.0)
MCV: 85.6 fL (ref 80.0–100.0)
Platelets: 165 10*3/uL (ref 150–400)
RBC: 4.39 MIL/uL (ref 4.22–5.81)
RDW: 13 % (ref 11.5–15.5)
WBC: 8.5 10*3/uL (ref 4.0–10.5)
nRBC: 0 % (ref 0.0–0.2)

## 2018-07-02 LAB — BASIC METABOLIC PANEL
Anion gap: 8 (ref 5–15)
BUN: 11 mg/dL (ref 8–23)
CO2: 18 mmol/L — ABNORMAL LOW (ref 22–32)
Calcium: 7.4 mg/dL — ABNORMAL LOW (ref 8.9–10.3)
Chloride: 113 mmol/L — ABNORMAL HIGH (ref 98–111)
Creatinine, Ser: 0.59 mg/dL — ABNORMAL LOW (ref 0.61–1.24)
GFR calc Af Amer: >60 mL/min (ref >60)
GFR calc non Af Amer: >60 mL/min (ref >60)
Glucose, Bld: 126 mg/dL — ABNORMAL HIGH (ref 70–99)
Potassium: 3.7 mmol/L (ref 3.5–5.1)
Sodium: 139 mmol/L (ref 135–145)

## 2018-07-02 LAB — POCT I-STAT 4, (NA,K, GLUC, HGB,HCT)
Glucose, Bld: 115 mg/dL — ABNORMAL HIGH (ref 70–99)
Glucose, Bld: 116 mg/dL — ABNORMAL HIGH (ref 70–99)
Glucose, Bld: 157 mg/dL — ABNORMAL HIGH (ref 70–99)
Glucose, Bld: 139 mg/dL — ABNORMAL HIGH (ref 70–99)
Glucose, Bld: 141 mg/dL — ABNORMAL HIGH (ref 70–99)
HCT: 35 % — ABNORMAL LOW (ref 39.0–52.0)
HCT: 35 % — ABNORMAL LOW (ref 39.0–52.0)
HCT: 35 % — ABNORMAL LOW (ref 39.0–52.0)
HCT: 35 % — ABNORMAL LOW (ref 39.0–52.0)
HEMATOCRIT: 38 % — AB (ref 39.0–52.0)
Hemoglobin: 11.9 g/dL — ABNORMAL LOW (ref 13.0–17.0)
Hemoglobin: 11.9 g/dL — ABNORMAL LOW (ref 13.0–17.0)
Hemoglobin: 11.9 g/dL — ABNORMAL LOW (ref 13.0–17.0)
Hemoglobin: 11.9 g/dL — ABNORMAL LOW (ref 13.0–17.0)
Hemoglobin: 12.9 g/dL — ABNORMAL LOW (ref 13.0–17.0)
Potassium: 3 mmol/L — ABNORMAL LOW (ref 3.5–5.1)
Potassium: 3.7 mmol/L (ref 3.5–5.1)
Potassium: 3.6 mmol/L (ref 3.5–5.1)
Potassium: 3.8 mmol/L (ref 3.5–5.1)
Potassium: 3.6 mmol/L (ref 3.5–5.1)
Sodium: 140 mmol/L (ref 135–145)
Sodium: 140 mmol/L (ref 135–145)
Sodium: 140 mmol/L (ref 135–145)
Sodium: 140 mmol/L (ref 135–145)
Sodium: 141 mmol/L (ref 135–145)

## 2018-07-02 LAB — GLUCOSE, CAPILLARY
GLUCOSE-CAPILLARY: 154 mg/dL — AB (ref 70–99)
Glucose-Capillary: 151 mg/dL — ABNORMAL HIGH (ref 70–99)
Glucose-Capillary: 136 mg/dL — ABNORMAL HIGH (ref 70–99)
Glucose-Capillary: 164 mg/dL — ABNORMAL HIGH (ref 70–99)

## 2018-07-02 LAB — MAGNESIUM: Magnesium: 2.1 mg/dL (ref 1.7–2.4)

## 2018-07-02 LAB — PHOSPHORUS: Phosphorus: 1.9 mg/dL — ABNORMAL LOW (ref 2.5–4.6)

## 2018-07-02 MED ORDER — VITAL AF 1.2 CAL PO LIQD
1000.0000 mL | ORAL | Status: DC
  Administered 2018-07-02: 1000 mL

## 2018-07-02 MED ORDER — INSULIN ASPART 100 UNIT/ML ~~LOC~~ SOLN
2.0000 [IU] | SUBCUTANEOUS | Status: DC
  Administered 2018-07-02: 2 [IU] via SUBCUTANEOUS
  Administered 2018-07-03 (×2): 4 [IU] via SUBCUTANEOUS
  Administered 2018-07-03 – 2018-07-04 (×3): 2 [IU] via SUBCUTANEOUS

## 2018-07-02 MED ORDER — PANTOPRAZOLE SODIUM 40 MG PO PACK
40.0000 mg | PACK | Freq: Every day | ORAL | Status: DC
  Administered 2018-07-02: 40 mg
  Filled 2018-07-02: qty 20

## 2018-07-02 MED ORDER — DEXMEDETOMIDINE HCL IN NACL 400 MCG/100ML IV SOLN
0.4000 ug/kg/h | INTRAVENOUS | Status: DC
  Administered 2018-07-02 – 2018-07-03 (×2): 0.4 ug/kg/h via INTRAVENOUS
  Filled 2018-07-02 (×2): qty 100

## 2018-07-02 MED ORDER — POTASSIUM CHLORIDE 10 MEQ/50ML IV SOLN
10.0000 meq | INTRAVENOUS | Status: AC
  Administered 2018-07-02 (×4): 10 meq via INTRAVENOUS
  Filled 2018-07-02 (×4): qty 50

## 2018-07-02 NOTE — Progress Notes (Signed)
[place] [place] Progress Note Patient Name: Axiel Fjeld DOB: [birth year] MRN: [id number]   Date of Service  07/02/2018  HPI/Events of Note  K+ 3.0  eICU Interventions  Elink electrolyte replacement protocol for K+ of 3.0        [place]wo U Ogan 07/02/2018, 12:03 AM

## 2018-07-02 NOTE — Progress Notes (Signed)
Pt sister brought in pts medical records from [facility], Dr. Yaakov Guthrie. Last visit 06/09/2018 for BP recheck.
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
## 2018-07-02 NOTE — Progress Notes (Signed)
Pt tracking RN at this time otherwise non purposeful movement.
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
## 2018-07-02 NOTE — Progress Notes (Signed)
Bite block placed due to patient clamping and biting down. Pt had bitten bottom lip on both sides of mouth.

## 2018-07-02 NOTE — Progress Notes (Signed)
Pt reached 36 degrees. Nimbex discontinued at this time.

## 2018-07-02 NOTE — Progress Notes (Signed)
Pts pupils are different sizes at this time. Right pupil 4, Left pupil 2. Both reactive.

## 2018-07-02 NOTE — Progress Notes (Addendum)
ALL sedation stopped 07/02/2018 1330

## 2018-07-03 ENCOUNTER — Inpatient Hospital Stay (HOSPITAL_COMMUNITY): Payer: Medicare HMO

## 2018-07-03 DIAGNOSIS — I700 Atherosclerosis of aorta: Secondary | ICD-10-CM

## 2018-07-03 DIAGNOSIS — R9431 Abnormal electrocardiogram [ECG] [EKG]: Secondary | ICD-10-CM

## 2018-07-03 DIAGNOSIS — L8990 Pressure ulcer of unspecified site, unspecified stage: Secondary | ICD-10-CM

## 2018-07-03 LAB — BASIC METABOLIC PANEL
ANION GAP: 7 (ref 5–15)
BUN: 16 mg/dL (ref 8–23)
CALCIUM: 7.5 mg/dL — AB (ref 8.9–10.3)
CO2: 21 mmol/L — ABNORMAL LOW (ref 22–32)
Chloride: 109 mmol/L (ref 98–111)
Creatinine, Ser: 0.73 mg/dL (ref 0.61–1.24)
GFR calc Af Amer: >60 mL/min (ref >60)
Glucose, Bld: 159 mg/dL — ABNORMAL HIGH (ref 70–99)
Potassium: 3.8 mmol/L (ref 3.5–5.1)
Sodium: 137 mmol/L (ref 135–145)

## 2018-07-03 LAB — GLUCOSE, CAPILLARY
GLUCOSE-CAPILLARY: 154 mg/dL — AB (ref 70–99)
Glucose-Capillary: 144 mg/dL — ABNORMAL HIGH (ref 70–99)
Glucose-Capillary: 173 mg/dL — ABNORMAL HIGH (ref 70–99)
Glucose-Capillary: 75 mg/dL (ref 70–99)
Glucose-Capillary: 127 mg/dL — ABNORMAL HIGH (ref 70–99)

## 2018-07-03 LAB — PHOSPHORUS: Phosphorus: 3.6 mg/dL (ref 2.5–4.6)

## 2018-07-03 LAB — MAGNESIUM: Magnesium: 1.8 mg/dL (ref 1.7–2.4)

## 2018-07-03 MED ORDER — PANTOPRAZOLE SODIUM 40 MG IV SOLR
40.0000 mg | INTRAVENOUS | Status: DC
  Administered 2018-07-04 (×2): 40 mg via INTRAVENOUS
  Filled 2018-07-03 (×2): qty 40

## 2018-07-03 MED ORDER — ACETAMINOPHEN 325 MG PO TABS
650.0000 mg | ORAL_TABLET | ORAL | Status: DC | PRN
  Administered 2018-07-04: 650 mg via ORAL
  Filled 2018-07-03: qty 2

## 2018-07-03 MED ORDER — ORAL CARE MOUTH RINSE
15.0000 mL | Freq: Two times a day (BID) | OROMUCOSAL | Status: DC
  Administered 2018-07-03 – 2018-07-07 (×7): 15 mL via OROMUCOSAL

## 2018-07-03 MED ORDER — ACETAMINOPHEN 650 MG RE SUPP
650.0000 mg | RECTAL | Status: DC | PRN
  Administered 2018-07-03: 650 mg via RECTAL
  Filled 2018-07-03: qty 1

## 2018-07-03 MED ORDER — FUROSEMIDE 10 MG/ML IJ SOLN
40.0000 mg | Freq: Once | INTRAMUSCULAR | Status: AC
  Administered 2018-07-03: 40 mg via INTRAVENOUS
  Filled 2018-07-03: qty 4
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
## 2018-07-03 NOTE — Progress Notes (Signed)
RT called to pt's room due to desat on 6 L [place].  RT asked RN to place pt on NRB until I arrived.  Pt's sat improved to 94% on NRB.  RT placed pt on [place] HFNC 15 L, sat 93%.  Pt tolerating well at this time.  RN notified MD.

## 2018-07-03 NOTE — Procedures (Signed)
Extubation Procedure Note  Patient Details:   Name: Daniel Holland DOB: 1951-01-13 MRN: [id number]   Airway Documentation:    Vent end date: 07/03/18 Vent end time: 0905   Evaluation  O2 sats: stable throughout Complications: No apparent complications Patient did tolerate procedure well. Bilateral Breath Sounds: Clear   Yes   Patient was extubated to a 4L [place] without any complications, dyspnea or stridor noted. Patient was instructed on IS x 5, highest goal reached was 642mL.   Antonia Culbertson L 07/03/2018, 9:05 AM

## 2018-07-03 NOTE — Progress Notes (Signed)
Received report from [employer]. Assuming care of pt. Agree with previous assessment. Pt is currently in bed, chair position. Family at bedside. Vital signs stable. Will continue to monitor.
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
## 2018-07-04 DIAGNOSIS — D62 Acute posthemorrhagic anemia: Secondary | ICD-10-CM

## 2018-07-04 DIAGNOSIS — R0682 Tachypnea, not elsewhere classified: Secondary | ICD-10-CM

## 2018-07-04 DIAGNOSIS — J9600 Acute respiratory failure, unspecified whether with hypoxia or hypercapnia: Secondary | ICD-10-CM

## 2018-07-04 DIAGNOSIS — R1310 Dysphagia, unspecified: Secondary | ICD-10-CM

## 2018-07-04 DIAGNOSIS — E669 Obesity, unspecified: Secondary | ICD-10-CM

## 2018-07-04 DIAGNOSIS — Z8673 Personal history of transient ischemic attack (TIA), and cerebral infarction without residual deficits: Secondary | ICD-10-CM

## 2018-07-04 DIAGNOSIS — E1169 Type 2 diabetes mellitus with other specified complication: Secondary | ICD-10-CM

## 2018-07-04 DIAGNOSIS — R5381 Other malaise: Secondary | ICD-10-CM

## 2018-07-04 DIAGNOSIS — J9601 Acute respiratory failure with hypoxia: Secondary | ICD-10-CM

## 2018-07-04 DIAGNOSIS — G9340 Encephalopathy, unspecified: Secondary | ICD-10-CM

## 2018-07-04 DIAGNOSIS — E118 Type 2 diabetes mellitus with unspecified complications: Secondary | ICD-10-CM

## 2018-07-04 LAB — GLUCOSE, CAPILLARY
GLUCOSE-CAPILLARY: 118 mg/dL — AB (ref 70–99)
Glucose-Capillary: 105 mg/dL — ABNORMAL HIGH (ref 70–99)
Glucose-Capillary: 107 mg/dL — ABNORMAL HIGH (ref 70–99)
Glucose-Capillary: 109 mg/dL — ABNORMAL HIGH (ref 70–99)
Glucose-Capillary: 87 mg/dL (ref 70–99)

## 2018-07-04 LAB — HEPATIC FUNCTION PANEL
ALBUMIN: 3.1 g/dL — AB (ref 3.5–5.0)
ALT: 142 U/L — ABNORMAL HIGH (ref 0–44)
AST: 79 U/L — ABNORMAL HIGH (ref 15–41)
Alkaline Phosphatase: 53 U/L (ref 38–126)
Bilirubin, Direct: 0.4 mg/dL — ABNORMAL HIGH (ref 0.0–0.2)
Indirect Bilirubin: 1.2 mg/dL — ABNORMAL HIGH (ref 0.3–0.9)
Total Bilirubin: 1.6 mg/dL — ABNORMAL HIGH (ref 0.3–1.2)
Total Protein: 6.6 g/dL (ref 6.5–8.1)

## 2018-07-04 LAB — CULTURE, RESPIRATORY W GRAM STAIN: Special Requests: NORMAL

## 2018-07-04 MED ORDER — SODIUM CHLORIDE 0.9 % IV SOLN
1.0000 g | INTRAVENOUS | Status: DC
  Administered 2018-07-04 – 2018-07-05 (×2): 1 g via INTRAVENOUS
  Filled 2018-07-04 (×3): qty 10

## 2018-07-04 MED ORDER — POLYVINYL ALCOHOL 1.4 % OP SOLN
1.0000 [drp] | OPHTHALMIC | Status: DC | PRN
  Administered 2018-07-04 – 2018-07-05 (×2): 1 [drp] via OPHTHALMIC
  Filled 2018-07-04: qty 15
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
## 2018-07-04 NOTE — Progress Notes (Signed)
Rehab Admissions Coordinator Note:  Patient was screened by Retta Diones for appropriateness for an Inpatient Acute Rehab Consult.  At this time, we are recommending Inpatient Rehab consult.  Jodell Cipro M 07/04/2018, 9:18 AM  I can be reached at [phone].
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
## 2018-07-05 DIAGNOSIS — R1312 Dysphagia, oropharyngeal phase: Secondary | ICD-10-CM

## 2018-07-05 LAB — CBC
HCT: 38.8 % — ABNORMAL LOW (ref 39.0–52.0)
Hemoglobin: 12.8 g/dL — ABNORMAL LOW (ref 13.0–17.0)
MCH: 29.6 pg (ref 26.0–34.0)
MCHC: 33 g/dL (ref 30.0–36.0)
MCV: 89.6 fL (ref 80.0–100.0)
Platelets: 165 10*3/uL (ref 150–400)
RBC: 4.33 MIL/uL (ref 4.22–5.81)
RDW: 13.2 % (ref 11.5–15.5)
WBC: 9.2 10*3/uL (ref 4.0–10.5)
nRBC: 0 % (ref 0.0–0.2)

## 2018-07-05 LAB — COMPREHENSIVE METABOLIC PANEL
ALT: 109 U/L — ABNORMAL HIGH (ref 0–44)
ANION GAP: 11 (ref 5–15)
AST: 60 U/L — ABNORMAL HIGH (ref 15–41)
Albumin: 3.2 g/dL — ABNORMAL LOW (ref 3.5–5.0)
Alkaline Phosphatase: 52 U/L (ref 38–126)
BUN: 14 mg/dL (ref 8–23)
CALCIUM: 8.5 mg/dL — AB (ref 8.9–10.3)
CO2: 22 mmol/L (ref 22–32)
Chloride: 109 mmol/L (ref 98–111)
Creatinine, Ser: 0.76 mg/dL (ref 0.61–1.24)
GFR calc Af Amer: >60 mL/min (ref >60)
GFR calc non Af Amer: >60 mL/min (ref >60)
Glucose, Bld: 100 mg/dL — ABNORMAL HIGH (ref 70–99)
Potassium: 3.6 mmol/L (ref 3.5–5.1)
Sodium: 142 mmol/L (ref 135–145)
Total Bilirubin: 1.9 mg/dL — ABNORMAL HIGH (ref 0.3–1.2)
Total Protein: 6.4 g/dL — ABNORMAL LOW (ref 6.5–8.1)

## 2018-07-05 LAB — CULTURE, BLOOD (ROUTINE X 2)
Culture: NO GROWTH
Culture: NO GROWTH
Special Requests: ADEQUATE

## 2018-07-05 LAB — GLUCOSE, CAPILLARY
GLUCOSE-CAPILLARY: 98 mg/dL (ref 70–99)
Glucose-Capillary: 116 mg/dL — ABNORMAL HIGH (ref 70–99)
Glucose-Capillary: 135 mg/dL — ABNORMAL HIGH (ref 70–99)
Glucose-Capillary: 92 mg/dL (ref 70–99)
Glucose-Capillary: 101 mg/dL — ABNORMAL HIGH (ref 70–99)
Glucose-Capillary: 93 mg/dL (ref 70–99)
Glucose-Capillary: 99 mg/dL (ref 70–99)
Glucose-Capillary: 88 mg/dL (ref 70–99)

## 2018-07-05 MED ORDER — SODIUM CHLORIDE 0.9% FLUSH: 3.0000 mL | INTRAVENOUS | Status: DC | PRN

## 2018-07-05 MED ORDER — SODIUM CHLORIDE 0.9 % WEIGHT BASED INFUSION
3.0000 mL/kg/h | INTRAVENOUS | Status: DC
  Administered 2018-07-06: 3 mL/kg/h via INTRAVENOUS

## 2018-07-05 MED ORDER — SODIUM CHLORIDE 0.9 % IV SOLN: 250.0000 mL | INTRAVENOUS | Status: DC | PRN

## 2018-07-05 MED ORDER — ASPIRIN 81 MG PO CHEW
81.0000 mg | CHEWABLE_TABLET | ORAL | Status: AC
  Administered 2018-07-06: 81 mg via ORAL
  Filled 2018-07-05: qty 1

## 2018-07-05 MED ORDER — PANTOPRAZOLE SODIUM 40 MG PO TBEC
40.0000 mg | DELAYED_RELEASE_TABLET | Freq: Every day | ORAL | Status: DC
  Administered 2018-07-05 – 2018-07-07 (×3): 40 mg via ORAL
  Filled 2018-07-05 (×3): qty 1

## 2018-07-05 MED ORDER — ASPIRIN 81 MG PO CHEW
81.0000 mg | CHEWABLE_TABLET | Freq: Every day | ORAL | Status: DC
  Administered 2018-07-06: 81 mg via ORAL
  Filled 2018-07-05: qty 1

## 2018-07-05 MED ORDER — INSULIN ASPART 100 UNIT/ML ~~LOC~~ SOLN
0.0000 [IU] | Freq: Three times a day (TID) | SUBCUTANEOUS | Status: DC
  Administered 2018-07-07 (×2): 3 [IU] via SUBCUTANEOUS

## 2018-07-05 MED ORDER — SODIUM CHLORIDE 0.9 % WEIGHT BASED INFUSION: 1.0000 mL/kg/h | INTRAVENOUS | Status: DC

## 2018-07-05 MED ORDER — SODIUM CHLORIDE 0.9% FLUSH
3.0000 mL | Freq: Two times a day (BID) | INTRAVENOUS | Status: DC
  Administered 2018-07-05: 3 mL via INTRAVENOUS

## 2018-07-05 NOTE — Progress Notes (Signed)
Telephone report called to Daniel Cruz RN 6E. All questions answered. VS prior to transfer BP 138/78 HR 88 RR 17 02 95% on RA. No complaints of pain. Pt transported via wheelchair. Saline lock IV. All belongings sent with patient and family.
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
## 2018-07-06 ENCOUNTER — Other Ambulatory Visit: Payer: Self-pay | Admitting: *Deleted

## 2018-07-06 ENCOUNTER — Encounter (HOSPITAL_COMMUNITY)
Admission: EM | Disposition: A | Discharge status: Rehabilitation Facility | Payer: Self-pay | Source: Home / Self Care | Attending: Cardiothoracic Surgery

## 2018-07-06 DIAGNOSIS — I2510 Atherosclerotic heart disease of native coronary artery without angina pectoris: Secondary | ICD-10-CM

## 2018-07-06 DIAGNOSIS — I214 Non-ST elevation (NSTEMI) myocardial infarction: Secondary | ICD-10-CM

## 2018-07-06 DIAGNOSIS — I4581 Long QT syndrome: Secondary | ICD-10-CM

## 2018-07-06 DIAGNOSIS — R7989 Other specified abnormal findings of blood chemistry: Secondary | ICD-10-CM

## 2018-07-06 DIAGNOSIS — E785 Hyperlipidemia, unspecified: Secondary | ICD-10-CM

## 2018-07-06 HISTORY — PX: LEFT HEART CATH AND CORONARY ANGIOGRAPHY: CATH118249

## 2018-07-06 LAB — CBC
HCT: 37.7 % — ABNORMAL LOW (ref 39.0–52.0)
HEMOGLOBIN: 12.5 g/dL — AB (ref 13.0–17.0)
MCH: 29.2 pg (ref 26.0–34.0)
MCHC: 33.2 g/dL (ref 30.0–36.0)
MCV: 88.1 fL (ref 80.0–100.0)
Platelets: 163 10*3/uL (ref 150–400)
RBC: 4.28 MIL/uL (ref 4.22–5.81)
RDW: 13 % (ref 11.5–15.5)
WBC: 8 10*3/uL (ref 4.0–10.5)
nRBC: 0 % (ref 0.0–0.2)

## 2018-07-06 LAB — COMPREHENSIVE METABOLIC PANEL
ALT: 94 U/L — ABNORMAL HIGH (ref 0–44)
AST: 51 U/L — AB (ref 15–41)
Albumin: 3.3 g/dL — ABNORMAL LOW (ref 3.5–5.0)
Alkaline Phosphatase: 48 U/L (ref 38–126)
Anion gap: 9 (ref 5–15)
BUN: 13 mg/dL (ref 8–23)
CALCIUM: 8.6 mg/dL — AB (ref 8.9–10.3)
CO2: 25 mmol/L (ref 22–32)
Chloride: 107 mmol/L (ref 98–111)
Creatinine, Ser: 0.88 mg/dL (ref 0.61–1.24)
GFR calc Af Amer: >60 mL/min (ref >60)
GFR calc non Af Amer: >60 mL/min (ref >60)
Glucose, Bld: 115 mg/dL — ABNORMAL HIGH (ref 70–99)
Potassium: 3.3 mmol/L — ABNORMAL LOW (ref 3.5–5.1)
Sodium: 141 mmol/L (ref 135–145)
Total Bilirubin: 1.3 mg/dL — ABNORMAL HIGH (ref 0.3–1.2)
Total Protein: 6.3 g/dL — ABNORMAL LOW (ref 6.5–8.1)

## 2018-07-06 LAB — GLUCOSE, CAPILLARY
Glucose-Capillary: 113 mg/dL — ABNORMAL HIGH (ref 70–99)
Glucose-Capillary: 106 mg/dL — ABNORMAL HIGH (ref 70–99)
Glucose-Capillary: 90 mg/dL (ref 70–99)
Glucose-Capillary: 95 mg/dL (ref 70–99)

## 2018-07-06 SURGERY — LEFT HEART CATH AND CORONARY ANGIOGRAPHY
Anesthesia: LOCAL

## 2018-07-06 MED ORDER — IOHEXOL 350 MG/ML SOLN
INTRAVENOUS | Status: DC | PRN
  Administered 2018-07-06: 35 mL via INTRA_ARTERIAL

## 2018-07-06 MED ORDER — HEPARIN (PORCINE) IN NACL 1000-0.9 UT/500ML-% IV SOLN
INTRAVENOUS | Status: DC | PRN
  Administered 2018-07-06 (×2): 500 mL

## 2018-07-06 MED ORDER — ONDANSETRON HCL 4 MG/2ML IJ SOLN: 4.0000 mg | Freq: Four times a day (QID) | INTRAMUSCULAR | Status: DC | PRN

## 2018-07-06 MED ORDER — LIDOCAINE HCL (PF) 1 % IJ SOLN
INTRAMUSCULAR | Status: DC | PRN
  Administered 2018-07-06: 2 mL via SUBCUTANEOUS

## 2018-07-06 MED ORDER — VERAPAMIL HCL 2.5 MG/ML IV SOLN
INTRAVENOUS | Status: DC | PRN
  Administered 2018-07-06: 10 mL via INTRA_ARTERIAL

## 2018-07-06 MED ORDER — MIDAZOLAM HCL 2 MG/2ML IJ SOLN
INTRAMUSCULAR | Status: DC | PRN
  Administered 2018-07-06: 1 mg via INTRAVENOUS

## 2018-07-06 MED ORDER — HEPARIN (PORCINE) IN NACL 1000-0.9 UT/500ML-% IV SOLN
INTRAVENOUS | Status: AC
  Filled 2018-07-06: qty 1000

## 2018-07-06 MED ORDER — ACETAMINOPHEN 325 MG PO TABS: 650.0000 mg | ORAL_TABLET | ORAL | Status: DC | PRN

## 2018-07-06 MED ORDER — HEPARIN SODIUM (PORCINE) 1000 UNIT/ML IJ SOLN
INTRAMUSCULAR | Status: DC | PRN
  Administered 2018-07-06: 4000 [IU] via INTRAVENOUS

## 2018-07-06 MED ORDER — ASPIRIN EC 81 MG PO TBEC: 81.0000 mg | DELAYED_RELEASE_TABLET | Freq: Every day | ORAL | Status: DC

## 2018-07-06 MED ORDER — ASPIRIN 81 MG PO CHEW
81.0000 mg | CHEWABLE_TABLET | Freq: Every day | ORAL | Status: DC
  Administered 2018-07-07: 81 mg via ORAL
  Filled 2018-07-06: qty 1

## 2018-07-06 MED ORDER — SODIUM CHLORIDE 0.9 % IV SOLN: 250.0000 mL | INTRAVENOUS | Status: DC | PRN

## 2018-07-06 MED ORDER — ATORVASTATIN CALCIUM 80 MG PO TABS
80.0000 mg | ORAL_TABLET | Freq: Every day | ORAL | Status: DC
  Administered 2018-07-06 – 2018-07-14 (×8): 80 mg via ORAL
  Filled 2018-07-06 (×8): qty 1

## 2018-07-06 MED ORDER — ATORVASTATIN CALCIUM 80 MG PO TABS: 80.0000 mg | ORAL_TABLET | Freq: Every day | ORAL | Status: DC

## 2018-07-06 MED ORDER — SODIUM CHLORIDE 0.9 % IV SOLN: INTRAVENOUS | Status: AC

## 2018-07-06 MED ORDER — FENTANYL CITRATE (PF) 100 MCG/2ML IJ SOLN
INTRAMUSCULAR | Status: AC
  Filled 2018-07-06: qty 2

## 2018-07-06 MED ORDER — MIDAZOLAM HCL 2 MG/2ML IJ SOLN
INTRAMUSCULAR | Status: AC
  Filled 2018-07-06: qty 2

## 2018-07-06 MED ORDER — POTASSIUM CHLORIDE CRYS ER 20 MEQ PO TBCR
40.0000 meq | EXTENDED_RELEASE_TABLET | ORAL | Status: AC
  Administered 2018-07-06 (×2): 40 meq via ORAL
  Filled 2018-07-06 (×2): qty 2

## 2018-07-06 MED ORDER — VERAPAMIL HCL 2.5 MG/ML IV SOLN
INTRAVENOUS | Status: AC
  Filled 2018-07-06: qty 2

## 2018-07-06 MED ORDER — LIDOCAINE HCL (PF) 1 % IJ SOLN
INTRAMUSCULAR | Status: AC
  Filled 2018-07-06: qty 30

## 2018-07-06 MED ORDER — FENTANYL CITRATE (PF) 100 MCG/2ML IJ SOLN
INTRAMUSCULAR | Status: DC | PRN
  Administered 2018-07-06: 25 ug via INTRAVENOUS

## 2018-07-06 MED ORDER — SODIUM CHLORIDE 0.9% FLUSH: 3.0000 mL | INTRAVENOUS | Status: DC | PRN

## 2018-07-06 MED ORDER — HEPARIN (PORCINE) 25000 UT/250ML-% IV SOLN
1200.0000 [IU]/h | INTRAVENOUS | Status: DC
  Administered 2018-07-06 – 2018-07-07 (×2): 1200 [IU]/h via INTRAVENOUS
  Filled 2018-07-06 (×2): qty 250

## 2018-07-06 MED ORDER — SODIUM CHLORIDE 0.9% FLUSH
3.0000 mL | Freq: Two times a day (BID) | INTRAVENOUS | Status: DC
  Administered 2018-07-06 – 2018-07-07 (×3): 3 mL via INTRAVENOUS

## 2018-07-06 MED ORDER — HEPARIN SODIUM (PORCINE) 1000 UNIT/ML IJ SOLN
INTRAMUSCULAR | Status: AC
  Filled 2018-07-06: qty 1

## 2018-07-06 MED ORDER — CEFDINIR 300 MG PO CAPS
300.0000 mg | ORAL_CAPSULE | Freq: Two times a day (BID) | ORAL | Status: DC
  Administered 2018-07-06 – 2018-07-07 (×3): 300 mg via ORAL
  Filled 2018-07-06 (×4): qty 1

## 2018-07-06 SURGICAL SUPPLY — 12 items
CATH INFINITI 5 FR JL3.5 (CATHETERS) ×2 IMPLANT
CATH OPTITORQUE TIG 4.0 5F (CATHETERS) ×2 IMPLANT
DEVICE RAD COMP TR BAND LRG (VASCULAR PRODUCTS) ×2 IMPLANT
GLIDESHEATH SLEND A-KIT 6F 22G (SHEATH) ×2 IMPLANT
GUIDEWIRE INQWIRE 1.5J.035X260 (WIRE) ×1 IMPLANT
INQWIRE 1.5J .035X260CM (WIRE) ×2
KIT HEART LEFT (KITS) ×2 IMPLANT
PACK CARDIAC CATHETERIZATION (CUSTOM PROCEDURE TRAY) ×2 IMPLANT
SYR MEDRAD MARK 7 150ML (SYRINGE) ×2 IMPLANT
TRANSDUCER W/STOPCOCK (MISCELLANEOUS) ×2 IMPLANT
TUBING CIL FLEX 10 FLL-RA (TUBING) ×2 IMPLANT
WIRE HI TORQ VERSACORE-J 145CM (WIRE) ×2 IMPLANT

## 2018-07-06 NOTE — Progress Notes (Signed)
TCTS called for CABG evaluation.  °
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
## 2018-07-06 NOTE — Interval H&P Note (Signed)
Cath Lab Visit (complete for each Cath Lab visit)  Clinical Evaluation Leading to the Procedure:   ACS: Yes.    Non-ACS:    Anginal Classification: CCS IV  Anti-ischemic medical therapy: No Therapy  Non-Invasive Test Results: No non-invasive testing performed  Prior CABG: No previous CABG      History and Physical Interval Note:  07/06/2018 3:43 PM  Daniel Holland  has presented today for surgery, with the diagnosis of NSTEMI  The various methods of treatment have been discussed with the patient and family. After consideration of risks, benefits and other options for treatment, the patient has consented to  Procedure(s): LEFT HEART CATH AND CORONARY ANGIOGRAPHY (N/A) as a surgical intervention .  The patient's history has been reviewed, patient examined, no change in status, stable for surgery.  I have reviewed the patient's chart and labs.  Questions were answered to the patient's satisfaction.     Quay Burow
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
## 2018-07-06 NOTE — Progress Notes (Signed)
TR band removed from right radial site. Clear dressing with 2x2 underneath applied. No bleeding. Patient denies numbness or tingling in right hand/fingers.
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
## 2018-07-06 NOTE — Progress Notes (Signed)
SLP Cancellation Note  Patient Details Name: Daniel Holland MRN: [id number] DOB: 02-Oct-1950   Cancelled treatment:       Reason Eval/Treat Not Completed: Medical issues which prohibited therapy. Unable to complete assessment of diet tolerance, as pt is currently NPO for procedure. Will continue efforts.  Heer Justiss B. Quentin [facility], CCC-SLP Speech Language Pathologist [phone]  Shonna Chock 07/06/2018, 11:59 AM
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
## 2018-07-07 ENCOUNTER — Inpatient Hospital Stay (HOSPITAL_COMMUNITY): Payer: Medicare HMO

## 2018-07-07 ENCOUNTER — Other Ambulatory Visit: Payer: Self-pay

## 2018-07-07 ENCOUNTER — Encounter (HOSPITAL_COMMUNITY): Payer: Self-pay | Admitting: Cardiovascular Disease

## 2018-07-07 DIAGNOSIS — I214 Non-ST elevation (NSTEMI) myocardial infarction: Principal | ICD-10-CM

## 2018-07-07 DIAGNOSIS — Z01810 Encounter for preprocedural cardiovascular examination: Secondary | ICD-10-CM

## 2018-07-07 LAB — PULMONARY FUNCTION TEST
FEF 25-75 Pre: 1.3 L/sec
FEF2575-%Pred-Pre: 55 %
FEV1-%Pred-Pre: 54 %
FEV1-Pre: 1.65 L
FEV1FVC-%Pred-Pre: 100 %
FEV6-%Pred-Pre: 56 %
FEV6-Pre: 2.2 L
FEV6FVC-%Pred-Pre: 106 %
FVC-%Pred-Pre: 53 %
FVC-Pre: 2.22 L
Pre FEV1/FVC ratio: 74 %
Pre FEV6/FVC Ratio: 100 %

## 2018-07-07 LAB — COMPREHENSIVE METABOLIC PANEL
ALT: 85 U/L — ABNORMAL HIGH (ref 0–44)
AST: 47 U/L — ABNORMAL HIGH (ref 15–41)
Albumin: 3.3 g/dL — ABNORMAL LOW (ref 3.5–5.0)
Alkaline Phosphatase: 56 U/L (ref 38–126)
Anion gap: 9 (ref 5–15)
BUN: 13 mg/dL (ref 8–23)
CO2: 24 mmol/L (ref 22–32)
Calcium: 8.9 mg/dL (ref 8.9–10.3)
Chloride: 108 mmol/L (ref 98–111)
Creatinine, Ser: 0.73 mg/dL (ref 0.61–1.24)
GFR calc Af Amer: >60 mL/min (ref >60)
GFR calc non Af Amer: >60 mL/min (ref >60)
Glucose, Bld: 102 mg/dL — ABNORMAL HIGH (ref 70–99)
POTASSIUM: 3.8 mmol/L (ref 3.5–5.1)
Sodium: 141 mmol/L (ref 135–145)
Total Bilirubin: 1.4 mg/dL — ABNORMAL HIGH (ref 0.3–1.2)
Total Protein: 6.4 g/dL — ABNORMAL LOW (ref 6.5–8.1)

## 2018-07-07 LAB — GLUCOSE, CAPILLARY
Glucose-Capillary: 107 mg/dL — ABNORMAL HIGH (ref 70–99)
Glucose-Capillary: 160 mg/dL — ABNORMAL HIGH (ref 70–99)
Glucose-Capillary: 169 mg/dL — ABNORMAL HIGH (ref 70–99)
Glucose-Capillary: 108 mg/dL — ABNORMAL HIGH (ref 70–99)

## 2018-07-07 LAB — HEMOGLOBIN A1C
Hgb A1c MFr Bld: 6.3 % — ABNORMAL HIGH (ref 4.8–5.6)
Mean Plasma Glucose: 134.11 mg/dL

## 2018-07-07 LAB — PREPARE RBC (CROSSMATCH)

## 2018-07-07 LAB — CBC
HCT: 37.8 % — ABNORMAL LOW (ref 39.0–52.0)
HEMOGLOBIN: 13 g/dL (ref 13.0–17.0)
MCH: 30 pg (ref 26.0–34.0)
MCHC: 34.4 g/dL (ref 30.0–36.0)
MCV: 87.1 fL (ref 80.0–100.0)
Platelets: 170 10*3/uL (ref 150–400)
RBC: 4.34 MIL/uL (ref 4.22–5.81)
RDW: 12.9 % (ref 11.5–15.5)
WBC: 7.2 10*3/uL (ref 4.0–10.5)
nRBC: 0 % (ref 0.0–0.2)

## 2018-07-07 LAB — HEPARIN LEVEL (UNFRACTIONATED): Heparin Unfractionated: 0.32 IU/mL (ref 0.30–0.70)

## 2018-07-07 LAB — MAGNESIUM: Magnesium: 1.7 mg/dL (ref 1.7–2.4)

## 2018-07-07 MED ORDER — ENSURE MAX PROTEIN PO LIQD
11.0000 [oz_av] | Freq: Two times a day (BID) | ORAL | Status: DC
  Administered 2018-07-07: 11 [oz_av] via ORAL
  Filled 2018-07-07 (×2): qty 330

## 2018-07-07 MED ORDER — INSULIN REGULAR(HUMAN) IN NACL 100-0.9 UT/100ML-% IV SOLN
INTRAVENOUS | Status: AC
  Administered 2018-07-08: 1 [IU]/h via INTRAVENOUS
  Filled 2018-07-07: qty 100

## 2018-07-07 MED ORDER — VANCOMYCIN HCL 10 G IV SOLR
1250.0000 mg | INTRAVENOUS | Status: AC
  Administered 2018-07-08: 1500 mg via INTRAVENOUS
  Filled 2018-07-07: qty 1250

## 2018-07-07 MED ORDER — TRANEXAMIC ACID (OHS) PUMP PRIME SOLUTION
2.0000 mg/kg | INTRAVENOUS | Status: DC
  Filled 2018-07-07: qty 1.62

## 2018-07-07 MED ORDER — PHENYLEPHRINE HCL-NACL 20-0.9 MG/250ML-% IV SOLN
30.0000 ug/min | INTRAVENOUS | Status: AC
  Administered 2018-07-08: 20 ug/min via INTRAVENOUS
  Filled 2018-07-07: qty 250

## 2018-07-07 MED ORDER — SODIUM CHLORIDE 0.9 % IV SOLN
750.0000 mg | INTRAVENOUS | Status: AC
  Administered 2018-07-08: 750 mg via INTRAVENOUS
  Filled 2018-07-07: qty 750

## 2018-07-07 MED ORDER — DOPAMINE-DEXTROSE 3.2-5 MG/ML-% IV SOLN
0.0000 ug/kg/min | INTRAVENOUS | Status: AC
  Administered 2018-07-08: 2 ug/kg/min via INTRAVENOUS
  Filled 2018-07-07: qty 250

## 2018-07-07 MED ORDER — CHLORHEXIDINE GLUCONATE 4 % EX LIQD
60.0000 mL | Freq: Once | CUTANEOUS | Status: DC
  Filled 2018-07-07: qty 15

## 2018-07-07 MED ORDER — TRANEXAMIC ACID (OHS) BOLUS VIA INFUSION
15.0000 mg/kg | INTRAVENOUS | Status: AC
  Administered 2018-07-08: 1212 mg via INTRAVENOUS
  Filled 2018-07-07: qty 1212

## 2018-07-07 MED ORDER — SODIUM CHLORIDE 0.9 % IV SOLN
750.0000 mg | INTRAVENOUS | Status: DC
  Filled 2018-07-07: qty 750

## 2018-07-07 MED ORDER — TRANEXAMIC ACID (OHS) BOLUS VIA INFUSION
15.0000 mg/kg | INTRAVENOUS | Status: DC
  Filled 2018-07-07: qty 1212

## 2018-07-07 MED ORDER — SODIUM CHLORIDE 0.9 % IV SOLN
1.5000 g | INTRAVENOUS | Status: DC
  Filled 2018-07-07: qty 1.5

## 2018-07-07 MED ORDER — ENSURE MAX PROTEIN PO LIQD
11.0000 [oz_av] | Freq: Every day | ORAL | Status: DC
  Filled 2018-07-07: qty 330

## 2018-07-07 MED ORDER — VANCOMYCIN HCL 10 G IV SOLR
1250.0000 mg | INTRAVENOUS | Status: DC
  Filled 2018-07-07: qty 1250

## 2018-07-07 MED ORDER — DOPAMINE-DEXTROSE 3.2-5 MG/ML-% IV SOLN
0.0000 ug/kg/min | INTRAVENOUS | Status: DC
  Filled 2018-07-07: qty 250

## 2018-07-07 MED ORDER — POTASSIUM CHLORIDE 2 MEQ/ML IV SOLN
80.0000 meq | INTRAVENOUS | Status: DC
  Filled 2018-07-07: qty 40

## 2018-07-07 MED ORDER — PLASMA-LYTE 148 IV SOLN
INTRAVENOUS | Status: DC
  Filled 2018-07-07: qty 2.5

## 2018-07-07 MED ORDER — TRANEXAMIC ACID 1000 MG/10ML IV SOLN
1.5000 mg/kg/h | INTRAVENOUS | Status: DC
  Filled 2018-07-07 (×2): qty 25

## 2018-07-07 MED ORDER — ADULT MULTIVITAMIN W/MINERALS CH
1.0000 | ORAL_TABLET | Freq: Every day | ORAL | Status: DC
  Administered 2018-07-07: 1 via ORAL
  Filled 2018-07-07: qty 1

## 2018-07-07 MED ORDER — MAGNESIUM SULFATE 50 % IJ SOLN
40.0000 meq | INTRAMUSCULAR | Status: DC
  Filled 2018-07-07: qty 9.85

## 2018-07-07 MED ORDER — SODIUM CHLORIDE 0.9 % IV SOLN
INTRAVENOUS | Status: DC
  Filled 2018-07-07: qty 30

## 2018-07-07 MED ORDER — EPINEPHRINE PF 1 MG/ML IJ SOLN
0.0000 ug/min | INTRAVENOUS | Status: DC
  Filled 2018-07-07: qty 4

## 2018-07-07 MED ORDER — ALPRAZOLAM 0.25 MG PO TABS: 0.2500 mg | ORAL_TABLET | ORAL | Status: DC | PRN

## 2018-07-07 MED ORDER — BISACODYL 5 MG PO TBEC: 5.0000 mg | DELAYED_RELEASE_TABLET | Freq: Once | ORAL | Status: DC

## 2018-07-07 MED ORDER — TEMAZEPAM 15 MG PO CAPS: 15.0000 mg | ORAL_CAPSULE | Freq: Once | ORAL | Status: DC | PRN

## 2018-07-07 MED ORDER — NITROGLYCERIN IN D5W 200-5 MCG/ML-% IV SOLN
2.0000 ug/min | INTRAVENOUS | Status: DC
  Filled 2018-07-07: qty 250

## 2018-07-07 MED ORDER — DEXMEDETOMIDINE HCL IN NACL 400 MCG/100ML IV SOLN
0.1000 ug/kg/h | INTRAVENOUS | Status: AC
  Administered 2018-07-08: .7 ug/kg/h via INTRAVENOUS
  Filled 2018-07-07: qty 100

## 2018-07-07 MED ORDER — INSULIN REGULAR(HUMAN) IN NACL 100-0.9 UT/100ML-% IV SOLN
INTRAVENOUS | Status: DC
  Filled 2018-07-07: qty 100

## 2018-07-07 MED ORDER — CHLORHEXIDINE GLUCONATE 0.12 % MT SOLN
15.0000 mL | Freq: Once | OROMUCOSAL | Status: AC
  Administered 2018-07-08: 15 mL via OROMUCOSAL
  Filled 2018-07-07: qty 15

## 2018-07-07 MED ORDER — DEXMEDETOMIDINE HCL IN NACL 400 MCG/100ML IV SOLN
0.1000 ug/kg/h | INTRAVENOUS | Status: DC
  Filled 2018-07-07: qty 100

## 2018-07-07 MED ORDER — DIAZEPAM 5 MG PO TABS
5.0000 mg | ORAL_TABLET | Freq: Once | ORAL | Status: AC
  Administered 2018-07-08: 5 mg via ORAL
  Filled 2018-07-07: qty 1

## 2018-07-07 MED ORDER — SODIUM CHLORIDE 0.9 % IV SOLN
1.5000 g | INTRAVENOUS | Status: AC
  Administered 2018-07-08: 1.5 g via INTRAVENOUS
  Filled 2018-07-07: qty 1.5

## 2018-07-07 MED ORDER — PHENYLEPHRINE HCL-NACL 20-0.9 MG/250ML-% IV SOLN
30.0000 ug/min | INTRAVENOUS | Status: DC
  Filled 2018-07-07: qty 250

## 2018-07-07 MED ORDER — METOPROLOL TARTRATE 12.5 MG HALF TABLET
12.5000 mg | ORAL_TABLET | Freq: Once | ORAL | Status: AC
  Administered 2018-07-08: 12.5 mg via ORAL
  Filled 2018-07-07: qty 1

## 2018-07-07 MED ORDER — MILRINONE LACTATE IN DEXTROSE 20-5 MG/100ML-% IV SOLN
0.3000 ug/kg/min | INTRAVENOUS | Status: AC
  Administered 2018-07-08: 0.25 ug/kg/min via INTRAVENOUS
  Filled 2018-07-07: qty 100

## 2018-07-07 MED ORDER — CHLORHEXIDINE GLUCONATE 4 % EX LIQD
60.0000 mL | Freq: Once | CUTANEOUS | Status: AC
  Administered 2018-07-07: 4 via TOPICAL
  Filled 2018-07-07: qty 15

## 2018-07-07 MED ORDER — TRANEXAMIC ACID 1000 MG/10ML IV SOLN
1.5000 mg/kg/h | INTRAVENOUS | Status: DC
  Filled 2018-07-07: qty 25

## 2018-07-07 MED ORDER — MILRINONE LACTATE IN DEXTROSE 20-5 MG/100ML-% IV SOLN
0.3000 ug/kg/min | INTRAVENOUS | Status: DC
  Filled 2018-07-07: qty 100

## 2018-07-07 NOTE — Progress Notes (Signed)
Pre CABG evaluation completed. Please see preliminary notes on CV PROC under chart review. Daniel Holland(RDMS RVT) 07/07/18 12:22 PM

## 2018-07-07 NOTE — Plan of Care (Signed)
  Problem: Neurologic: Goal: Promote progressive neurologic recovery Outcome: Progressing   Problem: Education: Goal: Understanding of CV disease, CV risk reduction, and recovery process will improve Outcome: Progressing   Problem: Activity: Goal: Ability to return to baseline activity level will improve Outcome: Progressing   Problem: Cardiovascular: Goal: Ability to achieve and maintain adequate cardiovascular perfusion will improve Outcome: Progressing Goal: Vascular access site(s) Level 0-1 will be maintained Outcome: Progressing   Problem: Health Behavior/Discharge Planning: Goal: Ability to safely manage health-related needs after discharge will improve Outcome: Progressing
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
## 2018-07-07 NOTE — Progress Notes (Signed)
1 Day Post-Op Procedure(s) (LRB): LEFT HEART CATH AND CORONARY ANGIOGRAPHY (N/A) Subjective: Stable on IV heparin No chest pain Normal sinus rhythm without arrhythmia  Preoperative carotid Doppler showed no significant carotid disease  Procedure of CABG discussed in detail in room with patient and wife.  They understand the benefits risks and alternatives. Orders in place for CABG in a.m.  Objective: Vital signs in last 24 hours: Temp:  [98 F (36.7 C)-98.3 F (36.8 C)] 98.3 F (36.8 C) (02/11 1641) Pulse Rate:  [58-83] 68 (02/11 0900) Cardiac Rhythm: Normal sinus rhythm (02/11 1200) Resp:  [7-24] 19 (02/11 1400) BP: (91-156)/(62-95) 127/80 (02/11 1400) SpO2:  [89 %-97 %] 95 % (02/11 1200) Weight:  [80.8 kg] 80.8 kg (02/11 0500)  Hemodynamic parameters for last 24 hours:   Stable intake/Output from previous day: 02/10 0701 - 02/11 0700 In: 640.6 [I.V.:640.6] Out: 1850 [Urine:1850] Intake/Output this shift: Total I/O In: 107.5 [I.V.:107.5] Out: -  Exam Alert and oriented No JVD, old bruise over forehead Lungs clear Sternum stable Heart rhythm regular without murmur or gallop Abdomen soft No peripheral edema Neuro intact  Lab Results: Recent Labs    07/06/18 0510 07/07/18 0447  WBC 8.0 7.2  HGB 12.5* 13.0  HCT 37.7* 37.8*  PLT 163 170   BMET:  Recent Labs    07/06/18 0510 07/07/18 0447  NA 141 141  K 3.3* 3.8  CL 107 108  CO2 25 24  GLUCOSE 115* 102*  BUN 13 13  CREATININE 0.88 0.73  CALCIUM 8.6* 8.9    PT/INR: No results for input(s): LABPROT, INR in the last 72 hours. ABG    Component Value Date/Time   PHART 7.343 (L) 06/30/2018 1740   HCO3 24.8 06/30/2018 1740   TCO2 26 06/30/2018 1740   ACIDBASEDEF 2.0 06/30/2018 1740   O2SAT 100.0 06/30/2018 1740   CBG (last 3)  Recent Labs    07/07/18 0658 07/07/18 1137 07/07/18 1640  GLUCAP 107* 160* 169*    Assessment/Plan: S/P Procedure(s) (LRB): LEFT HEART CATH AND CORONARY ANGIOGRAPHY  (N/A) CABG in a.m. with grafts planned to LAD, circumflex, and RCA.   LOS: 7 days    Tharon Aquas Trigt III 07/07/2018

## 2018-07-07 NOTE — Progress Notes (Addendum)
Progress Note  Patient Name: Daniel Holland Date of Encounter: 07/07/2018  Primary Cardiologist: No primary care provider on file.   Subjective   Patient feeling well this AM. He understands the plan for CABG. Very nervous. Discussed the plan for medications changes. He was previously on losartan. All questions and concerns addressed.   Inpatient Medications    Scheduled Meds: . aspirin  81 mg Oral Daily  . atorvastatin  80 mg Oral q1800  . bisacodyl  5 mg Oral Once  . cefdinir  300 mg Oral Q12H  . chlorhexidine  60 mL Topical Once   And  . [START ON 07/08/2018] chlorhexidine  60 mL Topical Once  . [START ON 07/08/2018] chlorhexidine  15 mL Mouth/Throat Once  . [START ON 07/08/2018] diazepam  5 mg Oral Once  . heparin-papaverine-plasmalyte irrigation   Irrigation To OR  . insulin aspart  0-15 Units Subcutaneous TID WC  . insulin   Intravenous To OR  . magnesium sulfate  40 mEq Other To OR  . mouth rinse  15 mL Mouth Rinse BID  . [START ON 07/08/2018] metoprolol tartrate  12.5 mg Oral Once  . pantoprazole  40 mg Oral Q1200  . phenylephrine  30-200 mcg/min Intravenous To OR  . potassium chloride  80 mEq Other To OR  . sodium chloride flush  3 mL Intravenous Q12H  . tranexamic acid  15 mg/kg Intravenous To OR  . tranexamic acid  2 mg/kg Intracatheter To OR   Continuous Infusions: . sodium chloride    . cefUROXime (ZINACEF)  IV    . cefUROXime (ZINACEF)  IV    . dexmedetomidine    . DOPamine    . epinephrine    . heparin 30,000 units/NS 1000 mL solution for CELLSAVER    . heparin 1,200 Units/hr (07/07/18 0900)  . milrinone    . nitroGLYCERIN    . tranexamic acid (CYKLOKAPRON) infusion ([facility])    . vancomycin     PRN Meds: sodium chloride, acetaminophen, acetaminophen, ALPRAZolam, labetalol, ondansetron (ZOFRAN) IV, polyvinyl alcohol, sodium chloride flush, temazepam   Vital Signs    Vitals:   07/07/18 0700 07/07/18 0747 07/07/18 0800 07/07/18 0900  BP: (!) 113/95   128/74   Pulse: 78  70 68  Resp: 20  18 16   Temp:  98.1 F (36.7 C)    TempSrc:  Oral    SpO2: 95%  92% 95%  Weight:      Height:        Intake/Output Summary (Last 24 hours) at 07/07/2018 0905 Last data filed at 07/07/2018 0900 Gross per 24 hour  Intake 664.56 ml  Output 1850 ml  Net -1185.44 ml   Filed Weights   07/05/18 0546 07/06/18 0416 07/07/18 0500  Weight: 83.7 kg 82.1 kg 80.8 kg   Telemetry    NSR - Personally Reviewed  ECG    No new EKG - Personally Reviewed  Physical Exam   Today's Vitals   07/07/18 0700 07/07/18 0747 07/07/18 0800 07/07/18 0900  BP: (!) 113/95  128/74 (!) 141/78  Pulse: 78  70 68  Resp: 20  18 16   Temp:  98.1 F (36.7 C)    TempSrc:  Oral    SpO2: 95%  92% 95%  Weight:      Height:      PainSc:   0-No pain    Body mass index is 27.08 kg/m.  GEN: No acute distress.   Neck: No JVD Cardiac: RRR, no  murmurs, rubs, or gallops.  Respiratory: Clear to auscultation bilaterally. GI: Soft, nontender, non-distended  MS: No edema; No deformity. Neuro:  Nonfocal  Psych: Normal affect   Labs    Chemistry Recent Labs  Lab 07/05/18 0527 07/06/18 0510 07/07/18 0447  NA 142 141 141  K 3.6 3.3* 3.8  CL 109 107 108  CO2 22 25 24   GLUCOSE 100* 115* 102*  BUN 14 13 13   CREATININE 0.76 0.88 0.73  CALCIUM 8.5* 8.6* 8.9  PROT 6.4* 6.3* 6.4*  ALBUMIN 3.2* 3.3* 3.3*  AST 60* 51* 47*  ALT 109* 94* 85*  ALKPHOS 52 48 56  BILITOT 1.9* 1.3* 1.4*  GFRNONAA >60 >60 >60  GFRAA >60 >60 >60  ANIONGAP 11 9 9     Hematology Recent Labs  Lab 07/05/18 0527 07/06/18 0510 07/07/18 0447  WBC 9.2 8.0 7.2  RBC 4.33 4.28 4.34  HGB 12.8* 12.5* 13.0  HCT 38.8* 37.7* 37.8*  MCV 89.6 88.1 87.1  MCH 29.6 29.2 30.0  MCHC 33.0 33.2 34.4  RDW 13.2 13.0 12.9  PLT 165 163 170   Cardiac Enzymes Recent Labs  Lab 07/01/18 0025 07/01/18 0543 07/01/18 1131 07/01/18 1807  TROPONINI 3.02* 3.45* 3.35* 3.09*   No results for input(s): TROPIPOC in  the last 168 hours.   BNPNo results for input(s): BNP, PROBNP in the last 168 hours.   DDimer No results for input(s): DDIMER in the last 168 hours.   Radiology    No results found.  Cardiac Studies   Left Heart Cath 07/06/2018   Prox RCA lesion is 90% stenosed.  Mid RCA lesion is 95% stenosed.  Ost LM to Mid LM lesion is 60% stenosed.  Ost LAD to Prox LAD lesion is 90% stenosed.  Ost 1st Mrg lesion is 90% stenosed.  Transthoracic Echocardiogram 07/07/2018  1. The left ventricle has low normal systolic function of 63-78%. The cavity size is normal. There is no increased left ventricular wall thickness. Echo evidence of pseudonormalization in diastolic relaxation.  2. The right ventricle has normal systolic function. The cavity in normal in size. There is no increase in right ventricular wall thickness.  3. The aortic valve is tricuspid. There is mild thickening of the aortic valve.  4. There is mild dilatation of the aortic root.  5. Normal LV systolic function; moderate diastolic dysfunction; mildly dilated aortic root.  Patient Profile     68 y.o. male with HTN, HLD, DM, and prior CVA who was brought into the hospital on 2/04 after sustaining cardiac arrest (PEA). He subsequently underwent hypothermic protocol and remained in the ICU from 2/4-2/9 before being transferred to the floor.   Assessment & Plan    PEA arrest  Multivessel CAD involving proximal main - No CP or SOB  - Patient taken for cath yesterday that illustrated multivessel disease involving the proximal left main  - CT surgery evaluated the patient and is planning for CABG on 07/08/2018 - Continue IV heparin  - Continue atorvastatin 80 mg QD - Resume home losartan 50 mg QD prior to discharge  - Will need to be started on a beta-blocker prior to discharge   Prolonged QTc - QTc >500 msec on initial 2 EKGs  - Now resolved but would try to avoid QT prolonging medication if possible   For questions or  updates, please contact [facility] Please consult [email or web address] for contact info under Cardiology/STEMI.   Signed, Ina Homes, MD  07/07/2018, 9:05 AM    I  have examined the patient and reviewed assessment and plan and discussed with patient.  Agree with above as stated.  Plan for CABG in AM.  IV heparin until that time.  All questions answered.   Will need to add back some medical therapy as he is recovering from surgery.  Larae Grooms
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
## 2018-07-07 NOTE — Anesthesia Preprocedure Evaluation (Signed)
Anesthesia Evaluation  Patient identified by MRN, date of birth, ID band Patient awake    Reviewed: Allergy & Precautions, NPO status , Patient's Chart, lab work & pertinent test results  Airway Mallampati: II  TM Distance: >3 FB Neck ROM: Full    Dental  (+) Dental Advisory Given   Pulmonary neg pulmonary ROS,    Pulmonary exam normal breath sounds clear to auscultation       Cardiovascular hypertension, Pt. on medications + Past MI  Normal cardiovascular exam Rhythm:Regular Rate:Normal     Neuro/Psych CVA negative psych ROS   GI/Hepatic negative GI ROS, Neg liver ROS,   Endo/Other  diabetes, Type 2  Renal/GU negative Renal ROS     Musculoskeletal negative musculoskeletal ROS (+)   Abdominal   Peds  Hematology  (+) Blood dyscrasia, anemia ,   Anesthesia Other Findings   Reproductive/Obstetrics                             Anesthesia Physical Anesthesia Plan  ASA: IV  Anesthesia Plan: General   Post-op Pain Management:    Induction: Intravenous  PONV Risk Score and Plan: Treatment may vary due to age or medical condition and Midazolam  Airway Management Planned: Oral ETT  Additional Equipment: Arterial line, CVP, PA Cath, TEE and Ultrasound Guidance Line Placement  Intra-op Plan:   Post-operative Plan: Post-operative intubation/ventilation  Informed Consent: I have reviewed the patients History and Physical, chart, labs and discussed the procedure including the risks, benefits and alternatives for the proposed anesthesia with the patient or authorized representative who has indicated his/her understanding and acceptance.     Dental advisory given  Plan Discussed with: CRNA  Anesthesia Plan Comments:         Anesthesia Quick Evaluation

## 2018-07-07 NOTE — Progress Notes (Signed)
ANTICOAGULATION CONSULT NOTE - Follow-up Consult  Pharmacy Consult for IV heparin Indication: CAD, awaiting CABG  Allergies  Allergen Reactions  . Percocet [Oxycodone-Acetaminophen] Nausea And Vomiting  . Vicodin [Hydrocodone-Acetaminophen] Nausea And Vomiting    Patient Measurements: Height: 5\' 8"  (172.7 cm) Weight: 181 lb 1.6 oz (82.1 kg) IBW/kg (Calculated) : 68.4 Heparin Dosing Weight: 80 kg  Vital Signs: Temp: 98.1 F (36.7 C) (02/11 0300) Temp Source: Oral (02/11 0300) BP: 112/68 (02/11 0600) Pulse Rate: 60 (02/11 0600)  Labs: Recent Labs    07/05/18 0527 07/06/18 0510 07/07/18 0447  HGB 12.8* 12.5* 13.0  HCT 38.8* 37.7* 37.8*  PLT 165 163 170  HEPARINUNFRC  --   --  0.32  CREATININE 0.76 0.88 0.73    Estimated Creatinine Clearance: 91.1 mL/min (by C-G formula based on SCr of 0.73 mg/dL).  Assessment: 68 yo male admitted after cardiac arrest, s/p cath lab today which reveals CAD, awaiting CABG consult.  Pharmacy asked to start IV Heparin 4 hrs after radial sheath removed (pulled at 1615 pm).  No overt bleeding or complications noted.  Heparin level 0.32 (therapeutic) on gtt at 1200 units/hr. CBC stable.  Goal of Therapy:  Heparin level 0.3-0.7 units/ml Monitor platelets by anticoagulation protocol: Yes   Plan:  Continue heparin at 1200 units/hr  F/u daily heparin level and CBC F/u CABG scheduling  Sherlon Handing, PharmD, BCPS Clinical pharmacist  **Pharmacist phone directory can now be found on [email or web address] ([email or web address]).  Listed under [place].  07/07/2018 6:28 AM

## 2018-07-07 NOTE — Progress Notes (Signed)
CARDIAC REHAB PHASE I   PRE:  Rate/Rhythm: 85 SR    BP: sitting 127/80    SaO2:   MODE:  Ambulation: 480 ft   POST:  Rate/Rhythm: 104 ST    BP: sitting 131/84     SaO2:   Pt impulsive at times, wanting to get up on his own and not aware of wires. Steady walking hall with RW and stand by assist x1 with gait belt. Walked 1/2 lap and wanted to walk another. Denied CP or other sx. To recliner. Pts friend Daniel Holland was present and I discussed with pt and her sternal precautions, IS (2000 mL was best effort), mobility post op and d/c planning. Pt lives with his [place] dog but his sister is coming for a while to be with him. Also his friend Daniel Holland is supportive. Gave materials, he declined watching video. He needed reminders regarding our discussion and he has a hard time understanding why he needs supervision in the hospital and at home. Likes to be independent. [place], ACSM 07/07/2018 3:08 PM
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
## 2018-07-08 ENCOUNTER — Encounter (HOSPITAL_COMMUNITY): Payer: Self-pay | Admitting: Certified Registered Nurse Anesthetist

## 2018-07-08 ENCOUNTER — Encounter (HOSPITAL_COMMUNITY)
Admission: EM | Disposition: A | Discharge status: Rehabilitation Facility | Payer: Self-pay | Source: Home / Self Care | Attending: Cardiothoracic Surgery

## 2018-07-08 ENCOUNTER — Inpatient Hospital Stay (HOSPITAL_COMMUNITY): Payer: Medicare HMO | Admitting: Certified Registered Nurse Anesthetist

## 2018-07-08 ENCOUNTER — Inpatient Hospital Stay (HOSPITAL_COMMUNITY): Payer: Medicare HMO

## 2018-07-08 DIAGNOSIS — Z951 Presence of aortocoronary bypass graft: Secondary | ICD-10-CM

## 2018-07-08 HISTORY — PX: TEE WITHOUT CARDIOVERSION: SHX5443

## 2018-07-08 HISTORY — PX: CORONARY ARTERY BYPASS GRAFT: SHX141

## 2018-07-08 LAB — CBC
HCT: 37.7 % — ABNORMAL LOW (ref 39.0–52.0)
HCT: 28.7 % — ABNORMAL LOW (ref 39.0–52.0)
HCT: 27.7 % — ABNORMAL LOW (ref 39.0–52.0)
HEMOGLOBIN: 12.6 g/dL — AB (ref 13.0–17.0)
HEMOGLOBIN: 9.6 g/dL — AB (ref 13.0–17.0)
Hemoglobin: 9.2 g/dL — ABNORMAL LOW (ref 13.0–17.0)
MCH: 29 pg (ref 26.0–34.0)
MCH: 29.5 pg (ref 26.0–34.0)
MCH: 29.5 pg (ref 26.0–34.0)
MCHC: 33.4 g/dL (ref 30.0–36.0)
MCHC: 33.4 g/dL (ref 30.0–36.0)
MCHC: 33.2 g/dL (ref 30.0–36.0)
MCV: 86.7 fL (ref 80.0–100.0)
MCV: 88.3 fL (ref 80.0–100.0)
MCV: 88.8 fL (ref 80.0–100.0)
Platelets: 185 10*3/uL (ref 150–400)
Platelets: 149 10*3/uL — ABNORMAL LOW (ref 150–400)
Platelets: 155 10*3/uL (ref 150–400)
RBC: 4.35 MIL/uL (ref 4.22–5.81)
RBC: 3.25 MIL/uL — AB (ref 4.22–5.81)
RBC: 3.12 MIL/uL — ABNORMAL LOW (ref 4.22–5.81)
RDW: 13.1 % (ref 11.5–15.5)
RDW: 13.2 % (ref 11.5–15.5)
RDW: 13.4 % (ref 11.5–15.5)
WBC: 7.5 10*3/uL (ref 4.0–10.5)
WBC: 13.4 10*3/uL — ABNORMAL HIGH (ref 4.0–10.5)
WBC: 11.6 10*3/uL — ABNORMAL HIGH (ref 4.0–10.5)
nRBC: 0 % (ref 0.0–0.2)
nRBC: 0 % (ref 0.0–0.2)
nRBC: 0 % (ref 0.0–0.2)

## 2018-07-08 LAB — GLUCOSE, CAPILLARY
GLUCOSE-CAPILLARY: 139 mg/dL — AB (ref 70–99)
GLUCOSE-CAPILLARY: 111 mg/dL — AB (ref 70–99)
Glucose-Capillary: 112 mg/dL — ABNORMAL HIGH (ref 70–99)
Glucose-Capillary: 122 mg/dL — ABNORMAL HIGH (ref 70–99)
Glucose-Capillary: 125 mg/dL — ABNORMAL HIGH (ref 70–99)
Glucose-Capillary: 116 mg/dL — ABNORMAL HIGH (ref 70–99)
Glucose-Capillary: 76 mg/dL (ref 70–99)
Glucose-Capillary: 102 mg/dL — ABNORMAL HIGH (ref 70–99)
Glucose-Capillary: 154 mg/dL — ABNORMAL HIGH (ref 70–99)
Glucose-Capillary: 131 mg/dL — ABNORMAL HIGH (ref 70–99)

## 2018-07-08 LAB — POCT I-STAT 7, (LYTES, BLD GAS, ICA,H+H)
ACID-BASE EXCESS: 1 mmol/L (ref 0.0–2.0)
Acid-Base Excess: 4 mmol/L — ABNORMAL HIGH (ref 0.0–2.0)
Acid-base deficit: 5 mmol/L — ABNORMAL HIGH (ref 0.0–2.0)
Acid-base deficit: 2 mmol/L (ref 0.0–2.0)
Acid-base deficit: 4 mmol/L — ABNORMAL HIGH (ref 0.0–2.0)
BICARBONATE: 23.7 mmol/L (ref 20.0–28.0)
Bicarbonate: 27.9 mmol/L (ref 20.0–28.0)
Bicarbonate: 25.4 mmol/L (ref 20.0–28.0)
Bicarbonate: 21 mmol/L (ref 20.0–28.0)
Bicarbonate: 21.6 mmol/L (ref 20.0–28.0)
CALCIUM ION: 1.04 mmol/L — AB (ref 1.15–1.40)
Calcium, Ion: 0.88 mmol/L — CL (ref 1.15–1.40)
Calcium, Ion: 1.15 mmol/L (ref 1.15–1.40)
Calcium, Ion: 1.14 mmol/L — ABNORMAL LOW (ref 1.15–1.40)
Calcium, Ion: 1.11 mmol/L — ABNORMAL LOW (ref 1.15–1.40)
HCT: 25 % — ABNORMAL LOW (ref 39.0–52.0)
HCT: 22 % — ABNORMAL LOW (ref 39.0–52.0)
HCT: 25 % — ABNORMAL LOW (ref 39.0–52.0)
HEMATOCRIT: 27 % — AB (ref 39.0–52.0)
HEMATOCRIT: 26 % — AB (ref 39.0–52.0)
HEMOGLOBIN: 8.5 g/dL — AB (ref 13.0–17.0)
HEMOGLOBIN: 8.8 g/dL — AB (ref 13.0–17.0)
Hemoglobin: 9.2 g/dL — ABNORMAL LOW (ref 13.0–17.0)
Hemoglobin: 7.5 g/dL — ABNORMAL LOW (ref 13.0–17.0)
Hemoglobin: 8.5 g/dL — ABNORMAL LOW (ref 13.0–17.0)
O2 SAT: 97 %
O2 Saturation: 100 %
O2 Saturation: 98 %
O2 Saturation: 94 %
O2 Saturation: 96 %
PH ART: 7.421 (ref 7.350–7.450)
PH ART: 7.322 — AB (ref 7.350–7.450)
POTASSIUM: 4 mmol/L (ref 3.5–5.1)
POTASSIUM: 4.1 mmol/L (ref 3.5–5.1)
Patient temperature: 36.4
Patient temperature: 37.1
Patient temperature: 37.4
Potassium: 4 mmol/L (ref 3.5–5.1)
Potassium: 3.6 mmol/L (ref 3.5–5.1)
Potassium: 4.4 mmol/L (ref 3.5–5.1)
Sodium: 141 mmol/L (ref 135–145)
Sodium: 144 mmol/L (ref 135–145)
Sodium: 142 mmol/L (ref 135–145)
Sodium: 141 mmol/L (ref 135–145)
Sodium: 141 mmol/L (ref 135–145)
TCO2: 29 mmol/L (ref 22–32)
TCO2: 27 mmol/L (ref 22–32)
TCO2: 22 mmol/L (ref 22–32)
TCO2: 25 mmol/L (ref 22–32)
TCO2: 23 mmol/L (ref 22–32)
pCO2 arterial: 38.6 mmHg (ref 32.0–48.0)
pCO2 arterial: 39.1 mmHg (ref 32.0–48.0)
pCO2 arterial: 40.2 mmHg (ref 32.0–48.0)
pCO2 arterial: 44.3 mmHg (ref 32.0–48.0)
pCO2 arterial: 40.7 mmHg (ref 32.0–48.0)
pH, Arterial: 7.467 — ABNORMAL HIGH (ref 7.350–7.450)
pH, Arterial: 7.337 — ABNORMAL LOW (ref 7.350–7.450)
pH, Arterial: 7.334 — ABNORMAL LOW (ref 7.350–7.450)
pO2, Arterial: 390 mmHg — ABNORMAL HIGH (ref 83.0–108.0)
pO2, Arterial: 96 mmHg (ref 83.0–108.0)
pO2, Arterial: 95 mmHg (ref 83.0–108.0)
pO2, Arterial: 78 mmHg — ABNORMAL LOW (ref 83.0–108.0)
pO2, Arterial: 88 mmHg (ref 83.0–108.0)

## 2018-07-08 LAB — POCT I-STAT 4, (NA,K, GLUC, HGB,HCT)
GLUCOSE: 109 mg/dL — AB (ref 70–99)
Glucose, Bld: 109 mg/dL — ABNORMAL HIGH (ref 70–99)
Glucose, Bld: 93 mg/dL (ref 70–99)
Glucose, Bld: 99 mg/dL (ref 70–99)
Glucose, Bld: 123 mg/dL — ABNORMAL HIGH (ref 70–99)
Glucose, Bld: 131 mg/dL — ABNORMAL HIGH (ref 70–99)
Glucose, Bld: 128 mg/dL — ABNORMAL HIGH (ref 70–99)
HCT: 34 % — ABNORMAL LOW (ref 39.0–52.0)
HCT: 24 % — ABNORMAL LOW (ref 39.0–52.0)
HCT: 28 % — ABNORMAL LOW (ref 39.0–52.0)
HCT: 32 % — ABNORMAL LOW (ref 39.0–52.0)
HCT: 26 % — ABNORMAL LOW (ref 39.0–52.0)
HCT: 26 % — ABNORMAL LOW (ref 39.0–52.0)
HCT: 33 % — ABNORMAL LOW (ref 39.0–52.0)
HEMOGLOBIN: 8.2 g/dL — AB (ref 13.0–17.0)
Hemoglobin: 11.6 g/dL — ABNORMAL LOW (ref 13.0–17.0)
Hemoglobin: 10.9 g/dL — ABNORMAL LOW (ref 13.0–17.0)
Hemoglobin: 9.5 g/dL — ABNORMAL LOW (ref 13.0–17.0)
Hemoglobin: 8.8 g/dL — ABNORMAL LOW (ref 13.0–17.0)
Hemoglobin: 8.8 g/dL — ABNORMAL LOW (ref 13.0–17.0)
Hemoglobin: 11.2 g/dL — ABNORMAL LOW (ref 13.0–17.0)
POTASSIUM: 3.9 mmol/L (ref 3.5–5.1)
Potassium: 3.7 mmol/L (ref 3.5–5.1)
Potassium: 3.9 mmol/L (ref 3.5–5.1)
Potassium: 4 mmol/L (ref 3.5–5.1)
Potassium: 4.5 mmol/L (ref 3.5–5.1)
Potassium: 4.5 mmol/L (ref 3.5–5.1)
Potassium: 4 mmol/L (ref 3.5–5.1)
SODIUM: 138 mmol/L (ref 135–145)
Sodium: 140 mmol/L (ref 135–145)
Sodium: 140 mmol/L (ref 135–145)
Sodium: 141 mmol/L (ref 135–145)
Sodium: 142 mmol/L (ref 135–145)
Sodium: 138 mmol/L (ref 135–145)
Sodium: 140 mmol/L (ref 135–145)

## 2018-07-08 LAB — COMPREHENSIVE METABOLIC PANEL
ALT: 82 U/L — ABNORMAL HIGH (ref 0–44)
AST: 47 U/L — ABNORMAL HIGH (ref 15–41)
Albumin: 3.4 g/dL — ABNORMAL LOW (ref 3.5–5.0)
Alkaline Phosphatase: 50 U/L (ref 38–126)
Anion gap: 11 (ref 5–15)
BUN: 17 mg/dL (ref 8–23)
CHLORIDE: 104 mmol/L (ref 98–111)
CO2: 24 mmol/L (ref 22–32)
CREATININE: 0.87 mg/dL (ref 0.61–1.24)
Calcium: 8.9 mg/dL (ref 8.9–10.3)
GFR calc Af Amer: >60 mL/min (ref >60)
GFR calc non Af Amer: >60 mL/min (ref >60)
Glucose, Bld: 108 mg/dL — ABNORMAL HIGH (ref 70–99)
Potassium: 3.7 mmol/L (ref 3.5–5.1)
Sodium: 139 mmol/L (ref 135–145)
Total Bilirubin: 0.9 mg/dL (ref 0.3–1.2)
Total Protein: 6.3 g/dL — ABNORMAL LOW (ref 6.5–8.1)

## 2018-07-08 LAB — MAGNESIUM: Magnesium: 2.5 mg/dL — ABNORMAL HIGH (ref 1.7–2.4)

## 2018-07-08 LAB — HEPARIN LEVEL (UNFRACTIONATED): Heparin Unfractionated: 0.33 IU/mL (ref 0.30–0.70)

## 2018-07-08 LAB — PROTIME-INR
INR: 1.35
Prothrombin Time: 16.5 seconds — ABNORMAL HIGH (ref 11.4–15.2)

## 2018-07-08 LAB — PLATELET COUNT: Platelets: 143 10*3/uL — ABNORMAL LOW (ref 150–400)

## 2018-07-08 LAB — HEMOGLOBIN AND HEMATOCRIT, BLOOD
HCT: 27.2 % — ABNORMAL LOW (ref 39.0–52.0)
Hemoglobin: 9.2 g/dL — ABNORMAL LOW (ref 13.0–17.0)

## 2018-07-08 LAB — SURGICAL PCR SCREEN
MRSA, PCR: NEGATIVE
Staphylococcus aureus: NEGATIVE

## 2018-07-08 LAB — CREATININE, SERUM
Creatinine, Ser: 0.81 mg/dL (ref 0.61–1.24)
GFR calc Af Amer: >60 mL/min (ref >60)
GFR calc non Af Amer: >60 mL/min (ref >60)

## 2018-07-08 LAB — APTT: aPTT: 29 seconds (ref 24–36)

## 2018-07-08 LAB — PREPARE RBC (CROSSMATCH)

## 2018-07-08 SURGERY — CORONARY ARTERY BYPASS GRAFTING (CABG)
Anesthesia: General | Site: Chest

## 2018-07-08 MED ORDER — INSULIN REGULAR BOLUS VIA INFUSION
0.0000 [IU] | Freq: Three times a day (TID) | INTRAVENOUS | Status: DC
  Filled 2018-07-08: qty 10

## 2018-07-08 MED ORDER — CALCIUM CHLORIDE 10 % IV SOLN
INTRAVENOUS | Status: DC | PRN
  Administered 2018-07-08 (×4): 100 mg via INTRAVENOUS

## 2018-07-08 MED ORDER — SODIUM BICARBONATE 8.4 % IV SOLN
50.0000 meq | Freq: Once | INTRAVENOUS | Status: AC
  Administered 2018-07-08: 50 meq via INTRAVENOUS

## 2018-07-08 MED ORDER — DOCUSATE SODIUM 100 MG PO CAPS
200.0000 mg | ORAL_CAPSULE | Freq: Every day | ORAL | Status: DC
  Administered 2018-07-10: 200 mg via ORAL
  Filled 2018-07-08 (×5): qty 2

## 2018-07-08 MED ORDER — FENTANYL CITRATE (PF) 250 MCG/5ML IJ SOLN
INTRAMUSCULAR | Status: DC | PRN
  Administered 2018-07-08: 50 ug via INTRAVENOUS
  Administered 2018-07-08: 100 ug via INTRAVENOUS
  Administered 2018-07-08: 25 ug via INTRAVENOUS
  Administered 2018-07-08: 200 ug via INTRAVENOUS
  Administered 2018-07-08: 100 ug via INTRAVENOUS
  Administered 2018-07-08: 25 ug via INTRAVENOUS
  Administered 2018-07-08 (×3): 100 ug via INTRAVENOUS
  Administered 2018-07-08: 150 ug via INTRAVENOUS
  Administered 2018-07-08: 100 ug via INTRAVENOUS
  Administered 2018-07-08: 50 ug via INTRAVENOUS

## 2018-07-08 MED ORDER — CHLORHEXIDINE GLUCONATE 0.12 % MT SOLN
15.0000 mL | OROMUCOSAL | Status: AC
  Administered 2018-07-08: 15 mL via OROMUCOSAL

## 2018-07-08 MED ORDER — ACETAMINOPHEN 500 MG PO TABS
1000.0000 mg | ORAL_TABLET | Freq: Four times a day (QID) | ORAL | Status: AC
  Administered 2018-07-09 – 2018-07-12 (×11): 1000 mg via ORAL
  Filled 2018-07-08 (×14): qty 2

## 2018-07-08 MED ORDER — HEMOSTATIC AGENTS (NO CHARGE) OPTIME
TOPICAL | Status: DC | PRN
  Administered 2018-07-08 (×3): 1 via TOPICAL

## 2018-07-08 MED ORDER — PROPOFOL 10 MG/ML IV BOLUS
INTRAVENOUS | Status: DC | PRN
  Administered 2018-07-08: 50 mg via INTRAVENOUS

## 2018-07-08 MED ORDER — DOPAMINE-DEXTROSE 3.2-5 MG/ML-% IV SOLN: 1.0000 ug/kg/min | INTRAVENOUS | Status: DC

## 2018-07-08 MED ORDER — LACTATED RINGERS IV SOLN
INTRAVENOUS | Status: DC | PRN
  Administered 2018-07-08 (×2): via INTRAVENOUS

## 2018-07-08 MED ORDER — 0.9 % SODIUM CHLORIDE (POUR BTL) OPTIME
TOPICAL | Status: DC | PRN
  Administered 2018-07-08: 6000 mL

## 2018-07-08 MED ORDER — PLASMA-LYTE 148 IV SOLN
INTRAVENOUS | Status: DC | PRN
  Administered 2018-07-08: 500 mL via INTRAVASCULAR

## 2018-07-08 MED ORDER — DEXMEDETOMIDINE HCL IN NACL 200 MCG/50ML IV SOLN: 0.0000 ug/kg/h | INTRAVENOUS | Status: DC

## 2018-07-08 MED ORDER — FENTANYL CITRATE (PF) 250 MCG/5ML IJ SOLN
INTRAMUSCULAR | Status: AC
  Filled 2018-07-08: qty 15

## 2018-07-08 MED ORDER — MIDAZOLAM HCL 5 MG/5ML IJ SOLN
INTRAMUSCULAR | Status: DC | PRN
  Administered 2018-07-08: 2 mg via INTRAVENOUS
  Administered 2018-07-08: 5 mg via INTRAVENOUS
  Administered 2018-07-08: 3 mg via INTRAVENOUS

## 2018-07-08 MED ORDER — METOPROLOL TARTRATE 12.5 MG HALF TABLET
12.5000 mg | ORAL_TABLET | Freq: Two times a day (BID) | ORAL | Status: DC
  Administered 2018-07-09 – 2018-07-15 (×12): 12.5 mg via ORAL
  Filled 2018-07-08 (×12): qty 1

## 2018-07-08 MED ORDER — MIDAZOLAM HCL (PF) 10 MG/2ML IJ SOLN
INTRAMUSCULAR | Status: AC
  Filled 2018-07-08: qty 2

## 2018-07-08 MED ORDER — FENTANYL CITRATE (PF) 250 MCG/5ML IJ SOLN
INTRAMUSCULAR | Status: AC
  Filled 2018-07-08: qty 5

## 2018-07-08 MED ORDER — METOPROLOL TARTRATE 5 MG/5ML IV SOLN: 2.5000 mg | INTRAVENOUS | Status: DC | PRN

## 2018-07-08 MED ORDER — PROTAMINE SULFATE 10 MG/ML IV SOLN
INTRAVENOUS | Status: AC
  Filled 2018-07-08: qty 25

## 2018-07-08 MED ORDER — VANCOMYCIN HCL IN DEXTROSE 1-5 GM/200ML-% IV SOLN: 1000.0000 mg | Freq: Once | INTRAVENOUS | Status: DC

## 2018-07-08 MED ORDER — SODIUM CHLORIDE 0.45 % IV SOLN
INTRAVENOUS | Status: DC | PRN
  Administered 2018-07-08: 15:00:00 via INTRAVENOUS

## 2018-07-08 MED ORDER — LACTATED RINGERS IV SOLN: INTRAVENOUS | Status: DC

## 2018-07-08 MED ORDER — INSULIN REGULAR(HUMAN) IN NACL 100-0.9 UT/100ML-% IV SOLN: INTRAVENOUS | Status: DC

## 2018-07-08 MED ORDER — INFLUENZA VAC SPLIT HIGH-DOSE 0.5 ML IM SUSY: 0.5000 mL | PREFILLED_SYRINGE | INTRAMUSCULAR | Status: DC | PRN

## 2018-07-08 MED ORDER — TRAMADOL HCL 50 MG PO TABS
50.0000 mg | ORAL_TABLET | ORAL | Status: DC | PRN
  Administered 2018-07-08: 100 mg via ORAL
  Filled 2018-07-08: qty 2

## 2018-07-08 MED ORDER — LACTATED RINGERS IV SOLN
INTRAVENOUS | Status: DC | PRN
  Administered 2018-07-08: 08:00:00 via INTRAVENOUS

## 2018-07-08 MED ORDER — ONDANSETRON HCL 4 MG/2ML IJ SOLN: 4.0000 mg | Freq: Four times a day (QID) | INTRAMUSCULAR | Status: DC | PRN

## 2018-07-08 MED ORDER — ORAL CARE MOUTH RINSE
15.0000 mL | OROMUCOSAL | Status: DC
  Administered 2018-07-08: 15 mL via OROMUCOSAL

## 2018-07-08 MED ORDER — ROCURONIUM BROMIDE 50 MG/5ML IV SOSY
PREFILLED_SYRINGE | INTRAVENOUS | Status: AC
  Filled 2018-07-08: qty 5

## 2018-07-08 MED ORDER — FAMOTIDINE IN NACL 20-0.9 MG/50ML-% IV SOLN
20.0000 mg | Freq: Two times a day (BID) | INTRAVENOUS | Status: DC
  Administered 2018-07-08: 20 mg via INTRAVENOUS

## 2018-07-08 MED ORDER — PROTAMINE SULFATE 10 MG/ML IV SOLN
INTRAVENOUS | Status: DC | PRN
  Administered 2018-07-08: 290 mg via INTRAVENOUS

## 2018-07-08 MED ORDER — PROPOFOL 10 MG/ML IV BOLUS
INTRAVENOUS | Status: AC
  Filled 2018-07-08: qty 20

## 2018-07-08 MED ORDER — PHENYLEPHRINE HCL-NACL 20-0.9 MG/250ML-% IV SOLN
0.0000 ug/min | INTRAVENOUS | Status: DC
  Administered 2018-07-08: 30 ug/min via INTRAVENOUS
  Administered 2018-07-09: 100 ug/min via INTRAVENOUS
  Administered 2018-07-09: 80 ug/min via INTRAVENOUS
  Filled 2018-07-08 (×3): qty 250

## 2018-07-08 MED ORDER — ACETAMINOPHEN 650 MG RE SUPP
650.0000 mg | Freq: Once | RECTAL | Status: AC
  Administered 2018-07-08: 650 mg via RECTAL

## 2018-07-08 MED ORDER — ALBUMIN HUMAN 5 % IV SOLN
250.0000 mL | INTRAVENOUS | Status: AC | PRN
  Administered 2018-07-08 – 2018-07-09 (×3): 12.5 g via INTRAVENOUS
  Filled 2018-07-08: qty 250

## 2018-07-08 MED ORDER — SODIUM CHLORIDE 0.9% FLUSH
10.0000 mL | Freq: Two times a day (BID) | INTRAVENOUS | Status: DC
  Administered 2018-07-09: 10 mL
  Administered 2018-07-09: 30 mL
  Administered 2018-07-10 – 2018-07-12 (×6): 10 mL

## 2018-07-08 MED ORDER — FENTANYL CITRATE (PF) 100 MCG/2ML IJ SOLN
50.0000 ug | INTRAMUSCULAR | Status: DC | PRN
  Administered 2018-07-08 – 2018-07-09 (×6): 50 ug via INTRAVENOUS
  Filled 2018-07-08 (×6): qty 2

## 2018-07-08 MED ORDER — POTASSIUM CHLORIDE 10 MEQ/50ML IV SOLN
10.0000 meq | INTRAVENOUS | Status: AC
  Administered 2018-07-08 (×3): 10 meq via INTRAVENOUS

## 2018-07-08 MED ORDER — SODIUM CHLORIDE (PF) 0.9 % IJ SOLN
OROMUCOSAL | Status: DC | PRN
  Administered 2018-07-08 (×3): 1 mL via TOPICAL

## 2018-07-08 MED ORDER — SODIUM CHLORIDE 0.9% FLUSH
3.0000 mL | Freq: Two times a day (BID) | INTRAVENOUS | Status: DC
  Administered 2018-07-09 – 2018-07-15 (×12): 3 mL via INTRAVENOUS

## 2018-07-08 MED ORDER — OXYCODONE HCL 5 MG PO TABS
5.0000 mg | ORAL_TABLET | ORAL | Status: DC | PRN
  Administered 2018-07-08: 5 mg via ORAL
  Administered 2018-07-09 – 2018-07-14 (×5): 10 mg via ORAL
  Filled 2018-07-08 (×4): qty 2
  Filled 2018-07-08: qty 1
  Filled 2018-07-08: qty 2

## 2018-07-08 MED ORDER — PNEUMOCOCCAL VAC POLYVALENT 25 MCG/0.5ML IJ INJ: 0.5000 mL | INJECTION | INTRAMUSCULAR | Status: DC | PRN

## 2018-07-08 MED ORDER — MIDAZOLAM HCL 2 MG/2ML IJ SOLN
2.0000 mg | INTRAMUSCULAR | Status: DC | PRN
  Administered 2018-07-08: 2 mg via INTRAVENOUS

## 2018-07-08 MED ORDER — ALBUMIN HUMAN 5 % IV SOLN
INTRAVENOUS | Status: DC | PRN
  Administered 2018-07-08 (×2): via INTRAVENOUS

## 2018-07-08 MED ORDER — ACETAMINOPHEN 160 MG/5ML PO SOLN: 650.0000 mg | Freq: Once | ORAL | Status: AC

## 2018-07-08 MED ORDER — BISACODYL 10 MG RE SUPP: 10.0000 mg | Freq: Every day | RECTAL | Status: DC

## 2018-07-08 MED ORDER — SODIUM CHLORIDE 0.9 % IV SOLN
INTRAVENOUS | Status: DC | PRN
  Administered 2018-07-08: 14:00:00 via INTRAVENOUS

## 2018-07-08 MED ORDER — METOPROLOL TARTRATE 25 MG/10 ML ORAL SUSPENSION: 12.5000 mg | Freq: Two times a day (BID) | ORAL | Status: DC

## 2018-07-08 MED ORDER — LACTATED RINGERS IV SOLN
INTRAVENOUS | Status: DC
  Administered 2018-07-09: 11:00:00 via INTRAVENOUS

## 2018-07-08 MED ORDER — SODIUM CHLORIDE 0.9 % IV SOLN
INTRAVENOUS | Status: DC | PRN
  Administered 2018-07-08: 15 ug/min via INTRAVENOUS

## 2018-07-08 MED ORDER — VANCOMYCIN HCL IN DEXTROSE 1-5 GM/200ML-% IV SOLN
1000.0000 mg | Freq: Two times a day (BID) | INTRAVENOUS | Status: AC
  Administered 2018-07-08 – 2018-07-09 (×2): 1000 mg via INTRAVENOUS
  Filled 2018-07-08 (×2): qty 200

## 2018-07-08 MED ORDER — SODIUM CHLORIDE (PF) 0.9 % IJ SOLN
INTRAMUSCULAR | Status: AC
  Filled 2018-07-08: qty 20

## 2018-07-08 MED ORDER — BISACODYL 5 MG PO TBEC
10.0000 mg | DELAYED_RELEASE_TABLET | Freq: Every day | ORAL | Status: DC
  Administered 2018-07-10: 10 mg via ORAL
  Filled 2018-07-08 (×5): qty 2

## 2018-07-08 MED ORDER — ORAL CARE MOUTH RINSE
15.0000 mL | Freq: Two times a day (BID) | OROMUCOSAL | Status: DC
  Administered 2018-07-09 – 2018-07-15 (×8): 15 mL via OROMUCOSAL

## 2018-07-08 MED ORDER — PROTAMINE SULFATE 10 MG/ML IV SOLN
INTRAVENOUS | Status: AC
  Filled 2018-07-08: qty 5

## 2018-07-08 MED ORDER — PANTOPRAZOLE SODIUM 40 MG PO TBEC
40.0000 mg | DELAYED_RELEASE_TABLET | Freq: Every day | ORAL | Status: DC
  Administered 2018-07-10 – 2018-07-15 (×6): 40 mg via ORAL
  Filled 2018-07-08 (×6): qty 1

## 2018-07-08 MED ORDER — HEPARIN SODIUM (PORCINE) 1000 UNIT/ML IJ SOLN
INTRAMUSCULAR | Status: DC | PRN
  Administered 2018-07-08: 25000 [IU] via INTRAVENOUS
  Administered 2018-07-08 (×2): 2000 [IU] via INTRAVENOUS

## 2018-07-08 MED ORDER — ROCURONIUM BROMIDE 50 MG/5ML IV SOSY
PREFILLED_SYRINGE | INTRAVENOUS | Status: AC
  Filled 2018-07-08: qty 20

## 2018-07-08 MED ORDER — CHLORHEXIDINE GLUCONATE 0.12% ORAL RINSE (MEDLINE KIT)
15.0000 mL | Freq: Two times a day (BID) | OROMUCOSAL | Status: DC
  Administered 2018-07-08: 15 mL via OROMUCOSAL

## 2018-07-08 MED ORDER — NITROGLYCERIN IN D5W 200-5 MCG/ML-% IV SOLN: 0.0000 ug/min | INTRAVENOUS | Status: DC

## 2018-07-08 MED ORDER — SODIUM CHLORIDE 0.9 % IV SOLN
20.0000 ug | INTRAVENOUS | Status: AC
  Administered 2018-07-08: 20 ug via INTRAVENOUS
  Filled 2018-07-08: qty 5

## 2018-07-08 MED ORDER — ASPIRIN 81 MG PO CHEW: 324.0000 mg | CHEWABLE_TABLET | Freq: Every day | ORAL | Status: DC

## 2018-07-08 MED ORDER — SODIUM CHLORIDE 0.9 % IV SOLN
INTRAVENOUS | Status: DC
  Administered 2018-07-08: 15:00:00 via INTRAVENOUS

## 2018-07-08 MED ORDER — MIDAZOLAM HCL 2 MG/2ML IJ SOLN
INTRAMUSCULAR | Status: AC
  Filled 2018-07-08: qty 2

## 2018-07-08 MED ORDER — MUPIROCIN CALCIUM 2 % EX CREA
TOPICAL_CREAM | Freq: Three times a day (TID) | CUTANEOUS | Status: DC
  Administered 2018-07-08 – 2018-07-09 (×2): via TOPICAL
  Administered 2018-07-09: 1 via TOPICAL
  Administered 2018-07-09 – 2018-07-15 (×12): via TOPICAL
  Filled 2018-07-08: qty 15

## 2018-07-08 MED ORDER — CHLORHEXIDINE GLUCONATE CLOTH 2 % EX PADS
6.0000 | MEDICATED_PAD | Freq: Every day | CUTANEOUS | Status: DC
  Administered 2018-07-09 – 2018-07-12 (×4): 6 via TOPICAL

## 2018-07-08 MED ORDER — SODIUM CHLORIDE 0.9 % IV SOLN: 250.0000 mL | INTRAVENOUS | Status: DC

## 2018-07-08 MED ORDER — LACTATED RINGERS IV SOLN: 500.0000 mL | Freq: Once | INTRAVENOUS | Status: DC | PRN

## 2018-07-08 MED ORDER — HEPARIN SODIUM (PORCINE) 1000 UNIT/ML IJ SOLN
INTRAMUSCULAR | Status: AC
  Filled 2018-07-08: qty 3

## 2018-07-08 MED ORDER — EPHEDRINE 5 MG/ML INJ
INTRAVENOUS | Status: AC
  Filled 2018-07-08: qty 10

## 2018-07-08 MED ORDER — SODIUM CHLORIDE 0.9% FLUSH
3.0000 mL | INTRAVENOUS | Status: DC | PRN
  Administered 2018-07-11: 3 mL via INTRAVENOUS
  Filled 2018-07-08: qty 3

## 2018-07-08 MED ORDER — SODIUM CHLORIDE 0.9% FLUSH: 10.0000 mL | INTRAVENOUS | Status: DC | PRN

## 2018-07-08 MED ORDER — PHENYLEPHRINE 40 MCG/ML (10ML) SYRINGE FOR IV PUSH (FOR BLOOD PRESSURE SUPPORT)
PREFILLED_SYRINGE | INTRAVENOUS | Status: DC | PRN
  Administered 2018-07-08: 80 ug via INTRAVENOUS

## 2018-07-08 MED ORDER — MAGNESIUM SULFATE 4 GM/100ML IV SOLN
4.0000 g | Freq: Once | INTRAVENOUS | Status: AC
  Administered 2018-07-08: 4 g via INTRAVENOUS
  Filled 2018-07-08: qty 100

## 2018-07-08 MED ORDER — ASPIRIN EC 325 MG PO TBEC
325.0000 mg | DELAYED_RELEASE_TABLET | Freq: Every day | ORAL | Status: DC
  Administered 2018-07-10 – 2018-07-15 (×6): 325 mg via ORAL
  Filled 2018-07-08 (×6): qty 1

## 2018-07-08 MED ORDER — MILRINONE LACTATE IN DEXTROSE 20-5 MG/100ML-% IV SOLN
0.1250 ug/kg/min | INTRAVENOUS | Status: DC
  Administered 2018-07-09: 0.125 ug/kg/min via INTRAVENOUS
  Administered 2018-07-09: 0.25 ug/kg/min via INTRAVENOUS
  Filled 2018-07-08 (×2): qty 100

## 2018-07-08 MED ORDER — HEPARIN SODIUM (PORCINE) 1000 UNIT/ML IJ SOLN
INTRAMUSCULAR | Status: AC
  Filled 2018-07-08: qty 1

## 2018-07-08 MED ORDER — ROCURONIUM BROMIDE 10 MG/ML (PF) SYRINGE
PREFILLED_SYRINGE | INTRAVENOUS | Status: DC | PRN
  Administered 2018-07-08: 10 mg via INTRAVENOUS
  Administered 2018-07-08 (×2): 50 mg via INTRAVENOUS
  Administered 2018-07-08: 40 mg via INTRAVENOUS
  Administered 2018-07-08: 100 mg via INTRAVENOUS

## 2018-07-08 MED ORDER — SODIUM CHLORIDE 0.9 % IV SOLN
1.5000 g | Freq: Two times a day (BID) | INTRAVENOUS | Status: AC
  Administered 2018-07-08 – 2018-07-10 (×4): 1.5 g via INTRAVENOUS
  Filled 2018-07-08 (×4): qty 1.5

## 2018-07-08 MED ORDER — PHENYLEPHRINE 40 MCG/ML (10ML) SYRINGE FOR IV PUSH (FOR BLOOD PRESSURE SUPPORT)
PREFILLED_SYRINGE | INTRAVENOUS | Status: AC
  Filled 2018-07-08: qty 10

## 2018-07-08 MED ORDER — ACETAMINOPHEN 160 MG/5ML PO SOLN: 1000.0000 mg | Freq: Four times a day (QID) | ORAL | Status: DC

## 2018-07-08 MED ORDER — ROCURONIUM BROMIDE 100 MG/10ML IV SOLN: INTRAVENOUS | Status: DC | PRN

## 2018-07-08 SURGICAL SUPPLY — 102 items
ADAPTER CARDIO PERF ANTE/RETRO (ADAPTER) ×4 IMPLANT
ADPR PRFSN 84XANTGRD RTRGD (ADAPTER) ×2
BAG DECANTER FOR FLEXI CONT (MISCELLANEOUS) ×4 IMPLANT
BANDAGE ACE 4X5 VEL STRL LF (GAUZE/BANDAGES/DRESSINGS) ×4 IMPLANT
BANDAGE ACE 6X5 VEL STRL LF (GAUZE/BANDAGES/DRESSINGS) ×4 IMPLANT
BASKET HEART  (ORDER IN 25'S) (MISCELLANEOUS) ×1
BASKET HEART (ORDER IN 25'S) (MISCELLANEOUS) ×1
BASKET HEART (ORDER IN 25S) (MISCELLANEOUS) ×2 IMPLANT
BLADE CLIPPER SURG (BLADE) ×4 IMPLANT
BLADE MINI RND TIP GREEN BEAV (BLADE) ×4 IMPLANT
BLADE STERNUM SYSTEM 6 (BLADE) ×4 IMPLANT
BLADE SURG 12 STRL SS (BLADE) ×4 IMPLANT
BNDG CMPR MED 15X6 ELC VLCR LF (GAUZE/BANDAGES/DRESSINGS) ×2
BNDG ELASTIC 6X15 VLCR STRL LF (GAUZE/BANDAGES/DRESSINGS) ×4 IMPLANT
BNDG GAUZE ELAST 4 BULKY (GAUZE/BANDAGES/DRESSINGS) ×4 IMPLANT
CABLE PACING FASLOC BIEGE (MISCELLANEOUS) ×4 IMPLANT
CANISTER SUCT 3000ML PPV (MISCELLANEOUS) ×4 IMPLANT
CANNULA GUNDRY RCSP 15FR (MISCELLANEOUS) ×4 IMPLANT
CATH CPB KIT VANTRIGT (MISCELLANEOUS) ×4 IMPLANT
CATH ROBINSON RED A/P 18FR (CATHETERS) ×12 IMPLANT
CATH THORACIC 36FR RT ANG (CATHETERS) ×4 IMPLANT
CLIP FOGARTY SPRING 6M (CLIP) ×4 IMPLANT
CLIP VESOCCLUDE SM WIDE 24/CT (CLIP) ×4 IMPLANT
COVER WAND RF STERILE (DRAPES) IMPLANT
CRADLE DONUT ADULT HEAD (MISCELLANEOUS) ×4 IMPLANT
DRAIN CHANNEL 32F RND 10.7 FF (WOUND CARE) ×4 IMPLANT
DRAPE CARDIOVASCULAR INCISE (DRAPES) ×4
DRAPE SLUSH/WARMER DISC (DRAPES) ×4 IMPLANT
DRAPE SRG 135X102X78XABS (DRAPES) ×2 IMPLANT
DRSG AQUACEL AG ADV 3.5X14 (GAUZE/BANDAGES/DRESSINGS) ×4 IMPLANT
ELECT BLADE 4.0 EZ CLEAN MEGAD (MISCELLANEOUS) ×4
ELECT BLADE 6.5 EXT (BLADE) ×4 IMPLANT
ELECT CAUTERY BLADE 6.4 (BLADE) ×4 IMPLANT
ELECT REM PT RETURN 9FT ADLT (ELECTROSURGICAL) ×8
ELECTRODE BLDE 4.0 EZ CLN MEGD (MISCELLANEOUS) ×2 IMPLANT
ELECTRODE REM PT RTRN 9FT ADLT (ELECTROSURGICAL) ×4 IMPLANT
FELT TEFLON 1X6 (MISCELLANEOUS) ×4 IMPLANT
GAUZE SPONGE 4X4 12PLY STRL (GAUZE/BANDAGES/DRESSINGS) ×8 IMPLANT
GLOVE BIO SURGEON STRL SZ7.5 (GLOVE) ×12 IMPLANT
GLOVE BIOGEL M 6.5 STRL (GLOVE) ×12 IMPLANT
GLOVE BIOGEL PI IND STRL 6.5 (GLOVE) ×4 IMPLANT
GLOVE BIOGEL PI INDICATOR 6.5 (GLOVE) ×4
GLOVE INDICATOR 7.0 STRL GRN (GLOVE) ×16 IMPLANT
GOWN STRL REUS W/ TWL LRG LVL3 (GOWN DISPOSABLE) ×16 IMPLANT
GOWN STRL REUS W/TWL LRG LVL3 (GOWN DISPOSABLE) ×24
HEMOSTAT POWDER SURGIFOAM 1G (HEMOSTASIS) ×12 IMPLANT
HEMOSTAT SURGICEL 2X14 (HEMOSTASIS) ×4 IMPLANT
INSERT FOGARTY XLG (MISCELLANEOUS) IMPLANT
KIT BASIN OR (CUSTOM PROCEDURE TRAY) ×4 IMPLANT
KIT SUCTION CATH 14FR (SUCTIONS) ×4 IMPLANT
KIT TURNOVER KIT B (KITS) ×4 IMPLANT
KIT VASOVIEW HEMOPRO 2 VH 4000 (KITS) ×4 IMPLANT
LEAD PACING MYOCARDI (MISCELLANEOUS) ×4 IMPLANT
MARKER GRAFT CORONARY BYPASS (MISCELLANEOUS) ×12 IMPLANT
MARKER SKIN DUAL TIP RULER LAB (MISCELLANEOUS) ×4 IMPLANT
NS IRRIG 1000ML POUR BTL (IV SOLUTION) ×20 IMPLANT
PACK E OPEN HEART (SUTURE) ×4 IMPLANT
PACK OPEN HEART (CUSTOM PROCEDURE TRAY) ×4 IMPLANT
PAD ARMBOARD 7.5X6 YLW CONV (MISCELLANEOUS) ×8 IMPLANT
PAD ELECT DEFIB RADIOL ZOLL (MISCELLANEOUS) ×4 IMPLANT
PENCIL BUTTON HOLSTER BLD 10FT (ELECTRODE) ×4 IMPLANT
POWDER SURGICEL 3.0 GRAM (HEMOSTASIS) ×4 IMPLANT
PUNCH AORTIC ROTATE  4.5MM 8IN (MISCELLANEOUS) ×4 IMPLANT
PUNCH AORTIC ROTATE 4.0MM (MISCELLANEOUS) IMPLANT
PUNCH AORTIC ROTATE 4.5MM 8IN (MISCELLANEOUS) IMPLANT
PUNCH AORTIC ROTATE 5MM 8IN (MISCELLANEOUS) IMPLANT
SET CARDIOPLEGIA MPS 5001102 (MISCELLANEOUS) ×4 IMPLANT
SPONGE LAP 4X18 RFD (DISPOSABLE) ×4 IMPLANT
SURGIFLO W/THROMBIN 8M KIT (HEMOSTASIS) ×4 IMPLANT
SUT BONE WAX W31G (SUTURE) ×4 IMPLANT
SUT MNCRL AB 4-0 PS2 18 (SUTURE) IMPLANT
SUT PROLENE 3 0 SH DA (SUTURE) IMPLANT
SUT PROLENE 3 0 SH1 36 (SUTURE) IMPLANT
SUT PROLENE 4 0 RB 1 (SUTURE) ×12
SUT PROLENE 4 0 SH DA (SUTURE) ×4 IMPLANT
SUT PROLENE 4-0 RB1 .5 CRCL 36 (SUTURE) ×6 IMPLANT
SUT PROLENE 5 0 C 1 36 (SUTURE) IMPLANT
SUT PROLENE 6 0 C 1 30 (SUTURE) ×4 IMPLANT
SUT PROLENE 6 0 CC (SUTURE) ×28 IMPLANT
SUT PROLENE 8 0 BV175 6 (SUTURE) ×4 IMPLANT
SUT PROLENE BLUE 7 0 (SUTURE) ×8 IMPLANT
SUT SILK  1 MH (SUTURE)
SUT SILK 1 MH (SUTURE) IMPLANT
SUT SILK 2 0 SH CR/8 (SUTURE) ×4 IMPLANT
SUT SILK 3 0 SH CR/8 (SUTURE) IMPLANT
SUT STEEL 6MS V (SUTURE) ×12 IMPLANT
SUT STEEL SZ 6 DBL 3X14 BALL (SUTURE) ×4 IMPLANT
SUT VIC AB 1 CTX 36 (SUTURE) ×8
SUT VIC AB 1 CTX36XBRD ANBCTR (SUTURE) ×4 IMPLANT
SUT VIC AB 2-0 CT1 27 (SUTURE) ×4
SUT VIC AB 2-0 CT1 TAPERPNT 27 (SUTURE) ×2 IMPLANT
SUT VIC AB 2-0 CTX 27 (SUTURE) IMPLANT
SUT VIC AB 3-0 X1 27 (SUTURE) ×4 IMPLANT
SYSTEM SAHARA CHEST DRAIN ATS (WOUND CARE) ×4 IMPLANT
TAPE CLOTH SURG 4X10 WHT LF (GAUZE/BANDAGES/DRESSINGS) ×4 IMPLANT
TAPE PAPER 2X10 WHT MICROPORE (GAUZE/BANDAGES/DRESSINGS) ×4 IMPLANT
TOWEL GREEN STERILE (TOWEL DISPOSABLE) ×4 IMPLANT
TOWEL GREEN STERILE FF (TOWEL DISPOSABLE) ×4 IMPLANT
TRAY FOLEY SLVR 16FR TEMP STAT (SET/KITS/TRAYS/PACK) ×4 IMPLANT
TUBING INSUFFLATION (TUBING) ×4 IMPLANT
UNDERPAD 30X30 (UNDERPADS AND DIAPERS) ×4 IMPLANT
WATER STERILE IRR 1000ML POUR (IV SOLUTION) ×8 IMPLANT

## 2018-07-08 NOTE — Anesthesia Procedure Notes (Signed)
Central Venous Catheter Insertion Performed by: Nolon Nations, MD, anesthesiologist Start/End2/04/2019 8:15 AM, 07/08/2018 8:20 AM Patient location: Pre-op. Preanesthetic checklist: patient identified, IV checked, site marked, risks and benefits discussed, surgical consent, monitors and equipment checked, pre-op evaluation, timeout performed and anesthesia consent Hand hygiene performed  and maximum sterile barriers used  PA cath was placed.Swan type:thermodilution PA Cath depth:45 Procedure performed without using ultrasound guided technique. Attempts: 1 Patient tolerated the procedure well with no immediate complications.

## 2018-07-08 NOTE — Progress Notes (Signed)
Pre Procedure note for inpatients:   Daniel Holland has been scheduled for Procedure(s): LEFT HEART CATH AND CORONARY ANGIOGRAPHY (N/A) today. The various methods of treatment have been discussed with the patient. After consideration of the risks, benefits and treatment options the patient has consented to the planned procedure.   The patient has been seen and labs reviewed. There are no changes in the patient's condition to prevent proceeding with the planned procedure today.  Recent labs:  Lab Results  Component Value Date   WBC 7.5 07/08/2018   HGB 12.6 (L) 07/08/2018   HCT 37.7 (L) 07/08/2018   PLT 185 07/08/2018   GLUCOSE 108 (H) 07/08/2018   TRIG 57 06/30/2018   ALT 82 (H) 07/08/2018   AST 47 (H) 07/08/2018   NA 139 07/08/2018   K 3.7 07/08/2018   CL 104 07/08/2018   CREATININE 0.87 07/08/2018   BUN 17 07/08/2018   CO2 24 07/08/2018   INR 1.11 06/30/2018   HGBA1C 6.3 (H) 07/07/2018    Len Childs, MD 07/08/2018 7:12 AM

## 2018-07-08 NOTE — Anesthesia Procedure Notes (Signed)
Central Venous Catheter Insertion Performed by: Nolon Nations, MD, anesthesiologist Start/End2/04/2019 8:00 AM, 07/08/2018 8:15 AM Patient location: Pre-op. Preanesthetic checklist: patient identified, IV checked, site marked, risks and benefits discussed, surgical consent, monitors and equipment checked, pre-op evaluation, timeout performed and anesthesia consent Position: Trendelenburg Lidocaine 1% used for infiltration and patient sedated Hand hygiene performed  and maximum sterile barriers used  Catheter size: 8.5 Fr Sheath introducer Procedure performed using ultrasound guided technique. Ultrasound Notes:anatomy identified, needle tip was noted to be adjacent to the nerve/plexus identified, no ultrasound evidence of intravascular and/or intraneural injection and image(s) printed for medical record Attempts: 1 Following insertion, line sutured, dressing applied and Biopatch. Post procedure assessment: blood return through all ports, free fluid flow and no air  Patient tolerated the procedure well with no immediate complications.

## 2018-07-08 NOTE — Procedures (Signed)
Extubation Procedure Note  Patient Details:   Name: Daniel Holland DOB: 1951-01-04 MRN: [id number]   Airway Documentation:  Airway 7.5 mm (Active)  Secured at (cm) 23 cm 07/08/2018  2:49 PM  Measured From Lips 07/08/2018  2:49 PM  Secured Location Right 07/08/2018  2:49 PM  Secured By Pink Tape 07/08/2018  2:49 PM  Site Condition Dry 07/08/2018  2:49 PM   Vent end date: 07/03/18 Vent end time: 0905   Evaluation  O2 sats: stable throughout Complications: No apparent complications Patient did tolerate procedure well. Bilateral Breath Sounds: (P) Clear, Diminished   Yes   NIF and VC were performed prior to extubation. NIF was -20 and VC was 900. Pt was placed on 3 L [place]. Pt is stable at this time.   Ronaldo Miyamoto 07/08/2018, 6:50 PM

## 2018-07-08 NOTE — Plan of Care (Signed)
  Problem: Education: Goal: Ability to manage disease process will improve Outcome: Progressing   Problem: Cardiac: Goal: Ability to achieve and maintain adequate cardiopulmonary perfusion will improve Outcome: Progressing   Problem: Neurologic: Goal: Promote progressive neurologic recovery Outcome: Progressing   Problem: Skin Integrity: Goal: Risk for impaired skin integrity will be minimized. Outcome: Progressing   Problem: Education: Goal: Understanding of CV disease, CV risk reduction, and recovery process will improve Outcome: Progressing Goal: Individualized Educational Video(s) Outcome: Progressing   Problem: Activity: Goal: Ability to return to baseline activity level will improve Outcome: Progressing   Problem: Cardiovascular: Goal: Ability to achieve and maintain adequate cardiovascular perfusion will improve Outcome: Progressing Goal: Vascular access site(s) Level 0-1 will be maintained Outcome: Progressing   Problem: Health Behavior/Discharge Planning: Goal: Ability to safely manage health-related needs after discharge will improve Outcome: Progressing

## 2018-07-08 NOTE — Progress Notes (Signed)
[facility] PROGRESS NOTE  Patient: Daniel Holland DTP:[id number]   PCP: Vernie Shanks, MD DOB: Mar 26, 1951   DOA: 06/30/2018   DOS: 07/08/2018   The patient underwent CABG with cardiothoracic surgery.  Postprocedure patient's care has been transferred to ICU-cardiothoracic surgery. Highly appreciate cardiothoracic surgery in assisting with patient's care. We will monitor the patient peripherally.  Please call us with question.  Author: Berle Mull, MD [facility] 07/08/2018   To reach On-call, see care teams to locate the attending and reach out to them via [email or web address]. If 7PM-7AM, please contact night-coverage If you still have difficulty reaching the attending provider, please page the [facility] (Director on Call) for [facility] on amion for assistance.

## 2018-07-08 NOTE — Progress Notes (Signed)
IP rehab admissions - I am following for potential acute inpatient rehab admission.  Noted patient currently in the OR for heart surgery.  I will follow along for progress.  Call me for questions.  [phone]

## 2018-07-08 NOTE — Progress Notes (Signed)
PT Cancellation Note  Patient Details Name: Daniel Holland MRN: [id number] DOB: June 08, 1950   Cancelled Treatment:    Reason Eval/Treat Not Completed: Patient at procedure or test/unavailable. Pt currently off unit for procedure. Will continue to follow to progress PT POC.    Thelma Comp 07/08/2018, 7:30 AM   Rolinda Roan, PT, DPT Acute Rehabilitation Services Pager: [phone] Office: [phone]

## 2018-07-08 NOTE — Brief Op Note (Signed)
06/30/2018 - 07/08/2018  12:33 PM  PATIENT:  Daniel Holland  68 y.o. male  PRE-OPERATIVE DIAGNOSIS:  CAD WITH LMD  POST-OPERATIVE DIAGNOSIS:  CAD WITH LMD  PROCEDURE:  Procedure(s): CORONARY ARTERY BYPASS GRAFTING (CABG)x , using left internal mammary artery and right leg greater saphenous vein harvested endoscopically (N/A) TRANSESOPHAGEAL ECHOCARDIOGRAM (TEE) (N/A)  SURGEON:  Surgeon(s) and Role:    Ivin Poot, MD - Primary  PHYSICIAN ASSISTANT: Jadene Pierini PA-C  ANESTHESIA:   general  EBL:400 cc  BLOOD ADMINISTERED:none  DRAINS: ROUTINE PLEURAL AND PERICARDIAL CHEST DRAINS   LOCAL MEDICATIONS USED:  NONE  SPECIMEN:  No Specimen  DISPOSITION OF SPECIMEN:  N/A  COUNTS:  YES  TOURNIQUET:  * No tourniquets in log *  DICTATION: .Other Dictation: Dictation Number PENDING  PLAN OF CARE: Admit to inpatient   PATIENT DISPOSITION:  ICU - intubated and hemodynamically stable.   Delay start of Pharmacological VTE agent (>24hrs) due to surgical blood loss or risk of bleeding: yes  COMPLICATIONS: NO KNOWN

## 2018-07-08 NOTE — Progress Notes (Signed)
      [address]       ,[place] [address]             [phone]      Starting to wake up, weaning in progress  BP 108/69   Pulse 91   Temp 97.9 F (36.6 C)   Resp (!) 22   Ht 5\' 8"  (1.727 m)   Wt 80.8 kg   SpO2 97%   BMI 27.08 kg/m   CI= 3.0 on milrinone 0.25 and dopamine 2  Intake/Output Summary (Last 24 hours) at 07/08/2018 1816 Last data filed at 07/08/2018 1700 Gross per 24 hour  Intake 4332.67 ml  Output 2095 ml  Net 2237.67 ml   Hct= 29  Wean to extubate  Remo Lipps C. Roxan Hockey, MD [facility] [phone]

## 2018-07-08 NOTE — Transfer of Care (Signed)
Immediate Anesthesia Transfer of Care Note  Patient: Daniel Holland  Procedure(s) Performed: CORONARY ARTERY BYPASS GRAFTING (CABG)x 4,using left internal mammary artery and right leg greater saphenous vein harvested endoscopically (N/A Chest) TRANSESOPHAGEAL ECHOCARDIOGRAM (TEE) (N/A )  Patient Location: ICU  Anesthesia Type:General  Level of Consciousness: sedated and Patient remains intubated per anesthesia plan  Airway & Oxygen Therapy: Patient remains intubated per anesthesia plan and Patient placed on Ventilator (see vital sign flow sheet for setting)  Post-op Assessment: Report given to RN and Post -op Vital signs reviewed and stable  Post vital signs: Reviewed and stable  Last Vitals:  Vitals Value Taken Time  BP 92/59 07/08/2018  3:00 PM  Temp    Pulse 89 07/08/2018  3:02 PM  Resp 15 07/08/2018  3:02 PM  SpO2 94 % 07/08/2018  3:02 PM  Vitals shown include unvalidated device data.  Last Pain:  Vitals:   07/08/18 0400  TempSrc:   PainSc: 0-No pain      Patients Stated Pain Goal: 0 (54/62/70 3500)  Complications: No apparent anesthesia complications

## 2018-07-08 NOTE — Anesthesia Postprocedure Evaluation (Addendum)
Anesthesia Post Note  Patient: Daniel Holland  Procedure(s) Performed: CORONARY ARTERY BYPASS GRAFTING (CABG)x 4,using left internal mammary artery and right leg greater saphenous vein harvested endoscopically (N/A Chest) TRANSESOPHAGEAL ECHOCARDIOGRAM (TEE) (N/A )     Patient location during evaluation: SICU Anesthesia Type: General Level of consciousness: sedated and patient remains intubated per anesthesia plan Pain management: pain level controlled Vital Signs Assessment: post-procedure vital signs reviewed and stable Respiratory status: patient remains intubated per anesthesia plan Cardiovascular status: stable Anesthetic complications: no    Last Vitals:  Vitals:   07/08/18 1630 07/08/18 1645  BP:    Pulse: 94 91  Resp: 20 (!) 22  Temp: 36.6 C 36.6 C  SpO2: 99% 97%    Last Pain:  Vitals:   07/08/18 0400  TempSrc:   PainSc: 0-No pain                 Nolon Nations

## 2018-07-08 NOTE — Progress Notes (Signed)
OT Cancellation Note  Patient Details Name: Daniel Holland MRN: [id number] DOB: September 15, 1950   Cancelled Treatment:    Reason Eval/Treat Not Completed: Patient at procedure or test/ unavailable(Pt in ECHO lab. Will follow.)  Malka So 07/08/2018, 8:16 AM  Nestor Lewandowsky, OTR/L Acute Rehabilitation Services Pager: [phone] Office: [phone]

## 2018-07-08 NOTE — Anesthesia Procedure Notes (Signed)
Arterial Line Insertion Start/End2/04/2019 7:50 AM, 07/08/2018 7:55 AM Performed by: Colin Benton, CRNA, CRNA  Preanesthetic checklist: patient identified, IV checked, site marked, risks and benefits discussed, surgical consent, monitors and equipment checked, pre-op evaluation, timeout performed and anesthesia consent Lidocaine 1% used for infiltration Left, radial was placed Catheter size: 20 G Hand hygiene performed , maximum sterile barriers used  and Seldinger technique used Allen's test indicative of satisfactory collateral circulation Attempts: 1 Procedure performed without using ultrasound guided technique. Following insertion, dressing applied and Biopatch. Post procedure assessment: normal and unchanged  Patient tolerated the procedure well with no immediate complications. Additional procedure comments: Arterial line place by Simeon Craft. Marland Kitchen

## 2018-07-08 NOTE — Anesthesia Procedure Notes (Signed)
Procedure Name: Intubation Date/Time: 07/08/2018 8:54 AM Performed by: Colin Benton, CRNA Pre-anesthesia Checklist: Patient identified, Emergency Drugs available, Suction available and Patient being monitored Patient Re-evaluated:Patient Re-evaluated prior to induction Oxygen Delivery Method: Circle System Utilized Preoxygenation: Pre-oxygenation with 100% oxygen Induction Type: IV induction Ventilation: Mask ventilation without difficulty Laryngoscope Size: Mac and 4 Grade View: Grade I Tube type: Oral Tube size: 7.5 mm Number of attempts: 1 Airway Equipment and Method: Stylet and Oral airway Placement Confirmation: ETT inserted through vocal cords under direct vision,  positive ETCO2 and breath sounds checked- equal and bilateral Secured at: 23 cm Tube secured with: Tape Dental Injury: Teeth and Oropharynx as per pre-operative assessment  Comments: DVL and ETT insertion completed by Simeon Craft

## 2018-07-09 ENCOUNTER — Inpatient Hospital Stay (HOSPITAL_COMMUNITY): Payer: Medicare HMO

## 2018-07-09 ENCOUNTER — Encounter (HOSPITAL_COMMUNITY): Payer: Self-pay | Admitting: Cardiothoracic Surgery

## 2018-07-09 LAB — BASIC METABOLIC PANEL
Anion gap: 11 (ref 5–15)
Anion gap: 15 (ref 5–15)
BUN: 14 mg/dL (ref 8–23)
BUN: 10 mg/dL (ref 8–23)
CO2: 21 mmol/L — ABNORMAL LOW (ref 22–32)
CO2: 15 mmol/L — ABNORMAL LOW (ref 22–32)
Calcium: 8 mg/dL — ABNORMAL LOW (ref 8.9–10.3)
Calcium: 7.1 mg/dL — ABNORMAL LOW (ref 8.9–10.3)
Chloride: 105 mmol/L (ref 98–111)
Chloride: 105 mmol/L (ref 98–111)
Creatinine, Ser: 0.89 mg/dL (ref 0.61–1.24)
Creatinine, Ser: 0.53 mg/dL — ABNORMAL LOW (ref 0.61–1.24)
GFR calc Af Amer: >60 mL/min (ref >60)
GFR calc Af Amer: >60 mL/min (ref >60)
GFR calc non Af Amer: >60 mL/min (ref >60)
GLUCOSE: 126 mg/dL — AB (ref 70–99)
Glucose, Bld: 86 mg/dL (ref 70–99)
Potassium: 4.5 mmol/L (ref 3.5–5.1)
Potassium: 4.1 mmol/L (ref 3.5–5.1)
Sodium: 137 mmol/L (ref 135–145)
Sodium: 135 mmol/L (ref 135–145)

## 2018-07-09 LAB — GLUCOSE, CAPILLARY
GLUCOSE-CAPILLARY: 112 mg/dL — AB (ref 70–99)
GLUCOSE-CAPILLARY: 164 mg/dL — AB (ref 70–99)
GLUCOSE-CAPILLARY: 167 mg/dL — AB (ref 70–99)
Glucose-Capillary: 108 mg/dL — ABNORMAL HIGH (ref 70–99)
Glucose-Capillary: 97 mg/dL (ref 70–99)
Glucose-Capillary: 121 mg/dL — ABNORMAL HIGH (ref 70–99)
Glucose-Capillary: 100 mg/dL — ABNORMAL HIGH (ref 70–99)
Glucose-Capillary: 127 mg/dL — ABNORMAL HIGH (ref 70–99)
Glucose-Capillary: 131 mg/dL — ABNORMAL HIGH (ref 70–99)
Glucose-Capillary: 134 mg/dL — ABNORMAL HIGH (ref 70–99)
Glucose-Capillary: 140 mg/dL — ABNORMAL HIGH (ref 70–99)
Glucose-Capillary: 174 mg/dL — ABNORMAL HIGH (ref 70–99)
Glucose-Capillary: 111 mg/dL — ABNORMAL HIGH (ref 70–99)
Glucose-Capillary: 110 mg/dL — ABNORMAL HIGH (ref 70–99)

## 2018-07-09 LAB — CBC
HCT: 27.9 % — ABNORMAL LOW (ref 39.0–52.0)
HCT: 27.8 % — ABNORMAL LOW (ref 39.0–52.0)
Hemoglobin: 9.6 g/dL — ABNORMAL LOW (ref 13.0–17.0)
Hemoglobin: 9.1 g/dL — ABNORMAL LOW (ref 13.0–17.0)
MCH: 30.8 pg (ref 26.0–34.0)
MCH: 29.4 pg (ref 26.0–34.0)
MCHC: 34.4 g/dL (ref 30.0–36.0)
MCHC: 32.7 g/dL (ref 30.0–36.0)
MCV: 89.4 fL (ref 80.0–100.0)
MCV: 90 fL (ref 80.0–100.0)
PLATELETS: 196 10*3/uL (ref 150–400)
PLATELETS: 142 10*3/uL — AB (ref 150–400)
RBC: 3.12 MIL/uL — ABNORMAL LOW (ref 4.22–5.81)
RBC: 3.09 MIL/uL — ABNORMAL LOW (ref 4.22–5.81)
RDW: 13.5 % (ref 11.5–15.5)
RDW: 13.9 % (ref 11.5–15.5)
WBC: 14.2 10*3/uL — ABNORMAL HIGH (ref 4.0–10.5)
WBC: 13.1 10*3/uL — ABNORMAL HIGH (ref 4.0–10.5)
nRBC: 0 % (ref 0.0–0.2)
nRBC: 0 % (ref 0.0–0.2)

## 2018-07-09 LAB — MAGNESIUM
Magnesium: 2.4 mg/dL (ref 1.7–2.4)
Magnesium: 1.1 mg/dL — ABNORMAL LOW (ref 1.7–2.4)

## 2018-07-09 MED ORDER — FENTANYL CITRATE (PF) 100 MCG/2ML IJ SOLN
50.0000 ug | INTRAMUSCULAR | Status: DC | PRN
  Administered 2018-07-09 (×4): 50 ug via INTRAVENOUS
  Filled 2018-07-09 (×4): qty 2

## 2018-07-09 MED ORDER — INSULIN DETEMIR 100 UNIT/ML ~~LOC~~ SOLN
10.0000 [IU] | Freq: Every day | SUBCUTANEOUS | Status: DC
  Administered 2018-07-09 – 2018-07-15 (×7): 10 [IU] via SUBCUTANEOUS
  Filled 2018-07-09 (×7): qty 0.1

## 2018-07-09 MED ORDER — FUROSEMIDE 10 MG/ML IJ SOLN
20.0000 mg | Freq: Two times a day (BID) | INTRAMUSCULAR | Status: DC
  Administered 2018-07-09: 20 mg via INTRAVENOUS
  Filled 2018-07-09: qty 2

## 2018-07-09 MED ORDER — DOPAMINE-DEXTROSE 3.2-5 MG/ML-% IV SOLN: 0.0000 ug/kg/min | INTRAVENOUS | Status: DC

## 2018-07-09 MED ORDER — INSULIN ASPART 100 UNIT/ML ~~LOC~~ SOLN
0.0000 [IU] | SUBCUTANEOUS | Status: DC
  Administered 2018-07-09: 2 [IU] via SUBCUTANEOUS
  Administered 2018-07-10: 4 [IU] via SUBCUTANEOUS
  Administered 2018-07-11: 3 [IU] via SUBCUTANEOUS

## 2018-07-09 MED ORDER — FUROSEMIDE 10 MG/ML IJ SOLN
40.0000 mg | Freq: Two times a day (BID) | INTRAMUSCULAR | Status: DC
  Administered 2018-07-09: 40 mg via INTRAVENOUS
  Filled 2018-07-09: qty 4

## 2018-07-09 MED ORDER — BACITRACIN ZINC 500 UNIT/GM EX OINT
TOPICAL_OINTMENT | Freq: Two times a day (BID) | CUTANEOUS | Status: DC
  Administered 2018-07-09 – 2018-07-15 (×10): via TOPICAL
  Filled 2018-07-09: qty 28.4

## 2018-07-09 MED ORDER — MIDAZOLAM HCL 2 MG/2ML IJ SOLN
1.0000 mg | INTRAMUSCULAR | Status: DC | PRN
  Administered 2018-07-09: 1 mg via INTRAVENOUS
  Filled 2018-07-09: qty 2

## 2018-07-09 MED ORDER — SIMETHICONE 80 MG PO CHEW
80.0000 mg | CHEWABLE_TABLET | Freq: Four times a day (QID) | ORAL | Status: DC
  Administered 2018-07-09 – 2018-07-15 (×14): 80 mg via ORAL
  Filled 2018-07-09 (×18): qty 1

## 2018-07-09 NOTE — Progress Notes (Signed)
[facility] BRIEF SICU PROGRESS NOTE  1 Day Post-Op  S/P Procedure(s) (LRB): CORONARY ARTERY BYPASS GRAFTING (CABG)x 4,using left internal mammary artery and right leg greater saphenous vein harvested endoscopically (N/A) TRANSESOPHAGEAL ECHOCARDIOGRAM (TEE) (N/A)   Stable day NSR - sinus tach BP stable on milrinone and dopamine Breathing comfortably on 2 L/min UOP adequate  Plan: Continue current plan  Rexene Alberts, MD 07/09/2018 6:53 PM

## 2018-07-09 NOTE — Progress Notes (Addendum)
Patient's sister Daniel Holland voicing concerns about visitors being given information about patient's status.   Patient's sister Daniel Holland specifically concerned about patient's ex-girlfriend Daniel Holland receiving information, Daniel Holland was listed as an emergency contact and did have patient's password.   Daniel Holland is requesting Daniel Holland be removed from emergency contact list and has changed patient password.     Informed patient of password and emergency contact change.  Asked patient if these are the emergency contacts that he would like listed, patient stated "I don't want to get in the middle of it".  Discussed with patient that he has the right to decide who his emergency contacts are and he can advise RN and MD's who it is okay to share information with.  Patient stated he understood and was okay with Daniel Holland (sister) and Daniel Holland (friend/boss) as the emergency contacts.  Patient declined being informed of the password at this time.
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
## 2018-07-09 NOTE — Progress Notes (Addendum)
[facility] DAILY ICU PROGRESS NOTE                   [address]            [place] [address]          [phone]   1 Day Post-Op Procedure(s) (LRB): CORONARY ARTERY BYPASS GRAFTING (CABG)x 4,using left internal mammary artery and right leg greater saphenous vein harvested endoscopically (N/A) TRANSESOPHAGEAL ECHOCARDIOGRAM (TEE) (N/A)  Total Length of Stay:  LOS: 9 days   Subjective: Pretty sedate but comfortable, no specific c/o  Objective: Vital signs in last 24 hours: Temp:  [97 F (36.1 C)-100.4 F (38 C)] 98.5 F (36.9 C) (02/13 1129) Pulse Rate:  [85-128] 102 (02/13 1215) Cardiac Rhythm: Normal sinus rhythm (02/13 0400) Resp:  [12-32] 17 (02/13 1215) BP: (91-132)/(58-82) 132/82 (02/13 1215) SpO2:  [90 %-99 %] 94 % (02/13 1215) Arterial Line BP: (89-160)/(45-83) 113/52 (02/13 1115) FiO2 (%):  [40 %-50 %] 40 % (02/12 1820) Weight:  [85.6 kg] 85.6 kg (02/13 0500)  Filed Weights   07/06/18 0416 07/07/18 0500 07/09/18 0500  Weight: 82.1 kg 80.8 kg 85.6 kg    Weight change:    Hemodynamic parameters for last 24 hours: PAP: (23-38)/(9-21) 30/13 CO:  [4.7 L/min-8.5 L/min] 7.5 L/min CI:  [2.4 L/min/m2-4.4 L/min/m2] 3.9 L/min/m2  Intake/Output from previous day: 02/12 0701 - 02/13 0700 In: 6107.8 [I.V.:3614.3; Blood:150; IV Piggyback:2343.5] Out: 9150 [Urine:3626; Blood:450; Chest Tube:400]  Intake/Output this shift: Total I/O In: 615.4 [I.V.:315.3; IV Piggyback:300.1] Out: 720 [Urine:640; Chest Tube:80]  Current Meds: Scheduled Meds: . acetaminophen  1,000 mg Oral Q6H   Or  . acetaminophen (TYLENOL) oral liquid 160 mg/5 mL  1,000 mg Per Tube Q6H  . aspirin EC  325 mg Oral Daily   Or  . aspirin  324 mg Per Tube Daily  . atorvastatin  80 mg Oral q1800  . bisacodyl  10 mg Oral Daily   Or  . bisacodyl  10 mg Rectal Daily  . Chlorhexidine Gluconate Cloth  6 each Topical Daily  . docusate sodium  200 mg Oral Daily  . furosemide  20 mg  Intravenous BID  . insulin aspart  0-24 Units Subcutaneous Q4H  . insulin detemir  10 Units Subcutaneous Daily  . mouth rinse  15 mL Mouth Rinse BID  . metoprolol tartrate  12.5 mg Oral BID   Or  . metoprolol tartrate  12.5 mg Per Tube BID  . mupirocin cream   Topical TID  . [START ON 07/10/2018] pantoprazole  40 mg Oral Daily  . sodium chloride flush  10-40 mL Intracatheter Q12H  . sodium chloride flush  3 mL Intravenous Q12H   Continuous Infusions: . albumin human 12.5 g (07/09/18 0653)  . cefUROXime (ZINACEF)  IV Stopped (07/09/18 1009)  . DOPamine Stopped (07/09/18 1032)  . insulin 3.2 mL/hr at 07/09/18 1100  . lactated ringers    . lactated ringers 10 mL/hr at 07/09/18 1100  . milrinone 0.125 mcg/kg/min (07/09/18 1100)  . phenylephrine (NEO-SYNEPHRINE) Adult infusion 30 mcg/min (07/09/18 1100)   PRN Meds:.albumin human, fentaNYL (SUBLIMAZE) injection, Influenza vac split quadrivalent PF, metoprolol tartrate, ondansetron (ZOFRAN) IV, oxyCODONE, pneumococcal 23 valent vaccine, sodium chloride flush, sodium chloride flush, traMADol  General appearance: cooperative, fatigued and no distress Heart: regular rate and rhythm and tachy, no rub or murmur Lungs: dim in bases Abdomen: non distended, scant BS, non tender Extremities: no edema Wound: dressings CDI  Lab Results: CBC: Recent Labs    07/08/18 2100 07/09/18 0302  WBC 11.6* 14.2*  HGB 9.2* 9.6*  HCT 27.7* 27.9*  PLT 155 196   BMET:  Recent Labs    07/08/18 0345  07/08/18 1346  07/08/18 1958 07/08/18 2100 07/09/18 0302  NA 139   < > 140   < > 141  --  137  K 3.7   < > 4.0   < > 4.1  --  4.5  CL 104  --   --   --   --   --  105  CO2 24  --   --   --   --   --  21*  GLUCOSE 108*   < > 128*  --   --   --  126*  BUN 17  --   --   --   --   --  14  CREATININE 0.87  --   --   --   --  0.81 0.89  CALCIUM 8.9  --   --   --   --   --  8.0*   < > = values in this interval not displayed.    CMET: Lab Results    Component Value Date   WBC 14.2 (H) 07/09/2018   HGB 9.6 (L) 07/09/2018   HCT 27.9 (L) 07/09/2018   PLT 196 07/09/2018   GLUCOSE 126 (H) 07/09/2018   TRIG 57 06/30/2018   ALT 82 (H) 07/08/2018   AST 47 (H) 07/08/2018   NA 137 07/09/2018   K 4.5 07/09/2018   CL 105 07/09/2018   CREATININE 0.89 07/09/2018   BUN 14 07/09/2018   CO2 21 (L) 07/09/2018   INR 1.35 07/08/2018   HGBA1C 6.3 (H) 07/07/2018      PT/INR:  Recent Labs    07/08/18 1458  LABPROT 16.5*  INR 1.35   Radiology: Dg Chest Port 1 View  Result Date: 07/09/2018 CLINICAL DATA:  Chest tube EXAM: PORTABLE CHEST 1 VIEW COMPARISON:  Yesterday FINDINGS: Interval tracheal and esophageal extubation with lower volumes and increased atelectasis. Swan-Ganz catheter from the right with tip at the right main or interlobar pulmonary artery. Chest tubes remain. No visible pneumothorax. CABG with increased mediastinal widening attributed to the lower volumes. Gaseous distention of the stomach and hepatic flexure. IMPRESSION: 1. Lower volumes and increased atelectasis after extubation. 2. Gaseous distention of the stomach. Electronically Signed   By: Monte Fantasia M.D.   On: 07/09/2018 09:41   Dg Chest Port 1 View  Result Date: 07/08/2018 CLINICAL DATA:  68 year old male with a history of CABG EXAM: PORTABLE CHEST 1 VIEW COMPARISON:  07/03/2018, 07/02/2018, 06/30/2018 FINDINGS: Interval surgical changes of median sternotomy and CABG. Interval endotracheal tube placement, terminating approximately 3.8 cm above the carina. Interval placement of gastric tube terminating in the stomach left upper quadrant. Interval placement of right IJ sheath through which a Swan-Ganz catheter terminates in the right pulmonary artery. Mediastinal/pleural tubes in place.  No pneumothorax. Interval removal of left subclavian central line. Low lung volumes with linear opacities at the lung bases and blunting of the left costophrenic angle. Epicardial pacing  leads. IMPRESSION: Early surgical changes of median sternotomy and CABG. Endotracheal tube terminates suitably above the carina. Right IJ sheath transmits Swan-Ganz catheter which terminates in the right pulmonary artery. No evidence of pneumothorax. Mediastinal and pleural drains with no left pneumothorax. Likely small left pleural effusion. Basilar atelectasis. Gastric tube terminates in the stomach. Electronically  Signed   By: Corrie Mckusick D.O.   On: 07/08/2018 15:33   Dg Abd Portable 1v  Result Date: 07/09/2018 CLINICAL DATA:  Enteric tube placement EXAM: PORTABLE ABDOMEN - 1 VIEW COMPARISON:  06/30/2018 CT abdomen/pelvis FINDINGS: Enteric tube terminates in the body of the stomach. No disproportionately dilated small bowel loops. No evidence of pneumatosis or pneumoperitoneum. Inferior approach midline pelvic catheter terminates over the lower sacrum. No radiopaque nephrolithiasis. Intact lower sternotomy wires. IMPRESSION: Enteric tube terminates in the body of the stomach. Electronically Signed   By: Ilona Sorrel M.D.   On: 07/09/2018 09:25     Assessment/Plan: S/P Procedure(s) (LRB): CORONARY ARTERY BYPASS GRAFTING (CABG)x 4,using left internal mammary artery and right leg greater saphenous vein harvested endoscopically (N/A) TRANSESOPHAGEAL ECHOCARDIOGRAM (TEE) (N/A)  1 hemodyn stable in sinus on .125 milrinone, wean as able, dopamine is off. EKG non-specific STTW changes 2 some sinus brady, current HR around 104,monitor closely on current RX 3 abdominal exam pretty benign currently with NGT in place, change to intermit suction prior to d/c tube. AXR unremarkable 4 mild leukocytosis, reactive- monitor 5 H/H stable at 9.6/27.9 monitor ABL anemia  6 mild volume overload- diurese, received 20 IV lasix this am, UOP is excellent 7 moderate CT drainage- cont for now 8 routine cardiac rehab/pulm toilet 9 BS fair control, cont laeimir for now and then transition to home  [place] PA-C 07/09/2018 12:21 PM  Pager [phone]  Removed NG tube DC chest drains in am  patient examined and medical record reviewed,agree with above note. Tharon Aquas Trigt III 07/09/2018
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
## 2018-07-10 ENCOUNTER — Inpatient Hospital Stay (HOSPITAL_COMMUNITY): Payer: Medicare HMO

## 2018-07-10 LAB — CBC
HCT: 20.2 % — ABNORMAL LOW (ref 39.0–52.0)
HCT: 25.9 % — ABNORMAL LOW (ref 39.0–52.0)
Hemoglobin: 6.6 g/dL — CL (ref 13.0–17.0)
Hemoglobin: 8.6 g/dL — ABNORMAL LOW (ref 13.0–17.0)
MCH: 30.4 pg (ref 26.0–34.0)
MCH: 30.1 pg (ref 26.0–34.0)
MCHC: 32.7 g/dL (ref 30.0–36.0)
MCHC: 33.2 g/dL (ref 30.0–36.0)
MCV: 93.1 fL (ref 80.0–100.0)
MCV: 90.6 fL (ref 80.0–100.0)
Platelets: 120 10*3/uL — ABNORMAL LOW (ref 150–400)
Platelets: 144 10*3/uL — ABNORMAL LOW (ref 150–400)
RBC: 2.17 MIL/uL — ABNORMAL LOW (ref 4.22–5.81)
RBC: 2.86 MIL/uL — ABNORMAL LOW (ref 4.22–5.81)
RDW: 14 % (ref 11.5–15.5)
RDW: 13.8 % (ref 11.5–15.5)
WBC: 9.9 10*3/uL (ref 4.0–10.5)
WBC: 12.7 10*3/uL — ABNORMAL HIGH (ref 4.0–10.5)
nRBC: 0.2 % (ref 0.0–0.2)
nRBC: 0 % (ref 0.0–0.2)

## 2018-07-10 LAB — GLUCOSE, CAPILLARY
GLUCOSE-CAPILLARY: 108 mg/dL — AB (ref 70–99)
Glucose-Capillary: 110 mg/dL — ABNORMAL HIGH (ref 70–99)
Glucose-Capillary: 119 mg/dL — ABNORMAL HIGH (ref 70–99)
Glucose-Capillary: 153 mg/dL — ABNORMAL HIGH (ref 70–99)
Glucose-Capillary: 169 mg/dL — ABNORMAL HIGH (ref 70–99)
Glucose-Capillary: 96 mg/dL (ref 70–99)
Glucose-Capillary: 99 mg/dL (ref 70–99)

## 2018-07-10 LAB — BASIC METABOLIC PANEL
Anion gap: 8 (ref 5–15)
BUN: 14 mg/dL (ref 8–23)
CO2: 25 mmol/L (ref 22–32)
Calcium: 7.4 mg/dL — ABNORMAL LOW (ref 8.9–10.3)
Chloride: 98 mmol/L (ref 98–111)
Creatinine, Ser: 0.81 mg/dL (ref 0.61–1.24)
GFR calc Af Amer: >60 mL/min (ref >60)
GFR calc non Af Amer: >60 mL/min (ref >60)
Glucose, Bld: 406 mg/dL — ABNORMAL HIGH (ref 70–99)
Potassium: 3.8 mmol/L (ref 3.5–5.1)
Sodium: 131 mmol/L — ABNORMAL LOW (ref 135–145)

## 2018-07-10 MED ORDER — CHLORHEXIDINE GLUCONATE 0.12 % MT SOLN
OROMUCOSAL | Status: AC
  Filled 2018-07-10: qty 15

## 2018-07-10 MED ORDER — BOOST / RESOURCE BREEZE PO LIQD CUSTOM
1.0000 | Freq: Three times a day (TID) | ORAL | Status: DC
  Administered 2018-07-10 – 2018-07-12 (×3): 1 via ORAL

## 2018-07-10 MED ORDER — FUROSEMIDE 10 MG/ML IJ SOLN
20.0000 mg | Freq: Two times a day (BID) | INTRAMUSCULAR | Status: DC
  Administered 2018-07-10 – 2018-07-11 (×3): 20 mg via INTRAVENOUS
  Filled 2018-07-10 (×3): qty 2

## 2018-07-10 NOTE — Progress Notes (Addendum)
[address]       [place] [address]             [phone]      2 Days Post-Op Procedure(s) (LRB): CORONARY ARTERY BYPASS GRAFTING (CABG)x 4,using left internal mammary artery and right leg greater saphenous vein harvested endoscopically (N/A) TRANSESOPHAGEAL ECHOCARDIOGRAM (TEE) (N/A) Subjective: Looks and feels better  Objective: Vital signs in last 24 hours: Temp:  [97.9 F (36.6 C)-100.4 F (38 C)] 98.2 F (36.8 C) (02/14 0742) Pulse Rate:  [92-128] 101 (02/14 0700) Cardiac Rhythm: Normal sinus rhythm (02/14 0400) Resp:  [16-31] 24 (02/14 0700) BP: (89-141)/(52-83) 107/69 (02/14 0700) SpO2:  [90 %-99 %] 97 % (02/14 0700) Arterial Line BP: (99-161)/(48-83) 161/62 (02/13 1345) Weight:  [84 kg] 84 kg (02/14 0500)  Hemodynamic parameters for last 24 hours: PAP: (30-33)/(13-14) 30/13  Intake/Output from previous day: 02/13 0701 - 02/14 0700 In: 1045.7 [P.O.:60; I.V.:585.7; IV Piggyback:400.1] Out: 2706 [Urine:3255; Chest Tube:300] Intake/Output this shift: No intake/output data recorded.  General appearance: alert, cooperative and no distress Heart: regular rate and rhythm Lungs: mildly dim in bases Abdomen: benign exam, NGT is out Extremities: no edema Wound: EVH site ok, chest dressing intact  Lab Results: Recent Labs    07/10/18 0317 07/10/18 0417  WBC 9.9 12.7*  HGB 6.6* 8.6*  HCT 20.2* 25.9*  PLT 120* 144*   BMET:  Recent Labs    07/09/18 1821 07/10/18 0317  NA 135 131*  K 4.1 3.8  CL 105 98  CO2 15* 25  GLUCOSE 86 406*  BUN 10 14  CREATININE 0.53* 0.81  CALCIUM 7.1* 7.4*    PT/INR:  Recent Labs    07/08/18 1458  LABPROT 16.5*  INR 1.35   ABG    Component Value Date/Time   PHART 7.334 (L) 07/08/2018 1958   HCO3 21.6 07/08/2018 1958   TCO2 23 07/08/2018 1958   ACIDBASEDEF 4.0 (H) 07/08/2018 1958   O2SAT 96.0 07/08/2018 1958   CBG (last 3)  Recent Labs    07/10/18 0000 07/10/18 0319 07/10/18 0741    GLUCAP 99 96 110*    Meds Scheduled Meds: . acetaminophen  1,000 mg Oral Q6H   Or  . acetaminophen (TYLENOL) oral liquid 160 mg/5 mL  1,000 mg Per Tube Q6H  . aspirin EC  325 mg Oral Daily   Or  . aspirin  324 mg Per Tube Daily  . atorvastatin  80 mg Oral q1800  . bacitracin   Topical BID  . bisacodyl  10 mg Oral Daily   Or  . bisacodyl  10 mg Rectal Daily  . Chlorhexidine Gluconate Cloth  6 each Topical Daily  . docusate sodium  200 mg Oral Daily  . furosemide  20 mg Intravenous BID  . insulin aspart  0-24 Units Subcutaneous Q4H  . insulin detemir  10 Units Subcutaneous Daily  . mouth rinse  15 mL Mouth Rinse BID  . metoprolol tartrate  12.5 mg Oral BID   Or  . metoprolol tartrate  12.5 mg Per Tube BID  . mupirocin cream   Topical TID  . pantoprazole  40 mg Oral Daily  . simethicone  80 mg Oral QID  . sodium chloride flush  10-40 mL Intracatheter Q12H  . sodium chloride flush  3 mL Intravenous Q12H   Continuous Infusions: . cefUROXime (ZINACEF)  IV 1.5 g (07/10/18 0744)  . lactated ringers    . lactated ringers 10 mL/hr  at 07/10/18 0700   PRN Meds:.fentaNYL (SUBLIMAZE) injection, Influenza vac split quadrivalent PF, metoprolol tartrate, ondansetron (ZOFRAN) IV, oxyCODONE, pneumococcal 23 valent vaccine, sodium chloride flush, sodium chloride flush, traMADol  Xrays Dg Chest Port 1 View  Result Date: 07/09/2018 CLINICAL DATA:  Chest tube EXAM: PORTABLE CHEST 1 VIEW COMPARISON:  Yesterday FINDINGS: Interval tracheal and esophageal extubation with lower volumes and increased atelectasis. Swan-Ganz catheter from the right with tip at the right main or interlobar pulmonary artery. Chest tubes remain. No visible pneumothorax. CABG with increased mediastinal widening attributed to the lower volumes. Gaseous distention of the stomach and hepatic flexure. IMPRESSION: 1. Lower volumes and increased atelectasis after extubation. 2. Gaseous distention of the stomach. Electronically  Signed   By: Monte Fantasia M.D.   On: 07/09/2018 09:41   Dg Chest Port 1 View  Result Date: 07/08/2018 CLINICAL DATA:  68 year old male with a history of CABG EXAM: PORTABLE CHEST 1 VIEW COMPARISON:  07/03/2018, 07/02/2018, 06/30/2018 FINDINGS: Interval surgical changes of median sternotomy and CABG. Interval endotracheal tube placement, terminating approximately 3.8 cm above the carina. Interval placement of gastric tube terminating in the stomach left upper quadrant. Interval placement of right IJ sheath through which a Swan-Ganz catheter terminates in the right pulmonary artery. Mediastinal/pleural tubes in place.  No pneumothorax. Interval removal of left subclavian central line. Low lung volumes with linear opacities at the lung bases and blunting of the left costophrenic angle. Epicardial pacing leads. IMPRESSION: Early surgical changes of median sternotomy and CABG. Endotracheal tube terminates suitably above the carina. Right IJ sheath transmits Swan-Ganz catheter which terminates in the right pulmonary artery. No evidence of pneumothorax. Mediastinal and pleural drains with no left pneumothorax. Likely small left pleural effusion. Basilar atelectasis. Gastric tube terminates in the stomach. Electronically Signed   By: Corrie Mckusick D.O.   On: 07/08/2018 15:33   Dg Abd Portable 1v  Result Date: 07/09/2018 CLINICAL DATA:  Enteric tube placement EXAM: PORTABLE ABDOMEN - 1 VIEW COMPARISON:  06/30/2018 CT abdomen/pelvis FINDINGS: Enteric tube terminates in the body of the stomach. No disproportionately dilated small bowel loops. No evidence of pneumatosis or pneumoperitoneum. Inferior approach midline pelvic catheter terminates over the lower sacrum. No radiopaque nephrolithiasis. Intact lower sternotomy wires. IMPRESSION: Enteric tube terminates in the body of the stomach. Electronically Signed   By: Ilona Sorrel M.D.   On: 07/09/2018 09:25    Assessment/Plan: S/P Procedure(s) (LRB): CORONARY  ARTERY BYPASS GRAFTING (CABG)x 4,using left internal mammary artery and right leg greater saphenous vein harvested endoscopically (N/A) TRANSESOPHAGEAL ECHOCARDIOGRAM (TEE) (N/A)  1 hemodyn stable, milrinone is off, BP variable- observe closely 2 sinus rhythm 3 sats good on 2 liters, routine pulm toilet 4 tmax 100.4, mild leukocytosis- monitor closely 5 H/H slightly lower ABL anemia- monitor 6 thrombocytopenia improving 7 give lasix for mild volume overload 8 d/c chest tubes 9 d/c foley 10 keep in unit one more day, routine rehab  LOS: 10 days    Daniel Holland  07/10/2018 Pager [phone]
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
## 2018-07-10 NOTE — Discharge Instructions (Signed)

## 2018-07-10 NOTE — Progress Notes (Signed)
CT surgery p.m. Rounds  Patient doing well today Ambulated 3 times Chest tubes have been removed Vital signs stable, sinus rhythm Central line removed

## 2018-07-11 ENCOUNTER — Inpatient Hospital Stay (HOSPITAL_COMMUNITY): Payer: Medicare HMO

## 2018-07-11 LAB — COMPREHENSIVE METABOLIC PANEL
ALT: 52 U/L — ABNORMAL HIGH (ref 0–44)
AST: 51 U/L — ABNORMAL HIGH (ref 15–41)
Albumin: 3 g/dL — ABNORMAL LOW (ref 3.5–5.0)
Alkaline Phosphatase: 48 U/L (ref 38–126)
Anion gap: 7 (ref 5–15)
BUN: 16 mg/dL (ref 8–23)
CO2: 30 mmol/L (ref 22–32)
Calcium: 8 mg/dL — ABNORMAL LOW (ref 8.9–10.3)
Chloride: 101 mmol/L (ref 98–111)
Creatinine, Ser: 0.76 mg/dL (ref 0.61–1.24)
GFR calc Af Amer: >60 mL/min (ref >60)
GFR calc non Af Amer: >60 mL/min (ref >60)
Glucose, Bld: 110 mg/dL — ABNORMAL HIGH (ref 70–99)
Potassium: 3.2 mmol/L — ABNORMAL LOW (ref 3.5–5.1)
Sodium: 138 mmol/L (ref 135–145)
Total Bilirubin: 0.7 mg/dL (ref 0.3–1.2)
Total Protein: 5.7 g/dL — ABNORMAL LOW (ref 6.5–8.1)

## 2018-07-11 LAB — TYPE AND SCREEN
ABO/RH(D): O POS
Antibody Screen: NEGATIVE
Unit division: 0
Unit division: 0

## 2018-07-11 LAB — BPAM RBC
Blood Product Expiration Date: 202003102359
Blood Product Expiration Date: 202003102359
ISSUE DATE / TIME: 202002121142
ISSUE DATE / TIME: 202002121142
Unit Type and Rh: 5100
Unit Type and Rh: 5100

## 2018-07-11 LAB — CBC
HCT: 25.8 % — ABNORMAL LOW (ref 39.0–52.0)
Hemoglobin: 8.6 g/dL — ABNORMAL LOW (ref 13.0–17.0)
MCH: 29.6 pg (ref 26.0–34.0)
MCHC: 33.3 g/dL (ref 30.0–36.0)
MCV: 88.7 fL (ref 80.0–100.0)
Platelets: 194 10*3/uL (ref 150–400)
RBC: 2.91 MIL/uL — ABNORMAL LOW (ref 4.22–5.81)
RDW: 14 % (ref 11.5–15.5)
WBC: 12.7 10*3/uL — ABNORMAL HIGH (ref 4.0–10.5)
nRBC: 0 % (ref 0.0–0.2)

## 2018-07-11 LAB — GLUCOSE, CAPILLARY
Glucose-Capillary: 130 mg/dL — ABNORMAL HIGH (ref 70–99)
Glucose-Capillary: 119 mg/dL — ABNORMAL HIGH (ref 70–99)
Glucose-Capillary: 170 mg/dL — ABNORMAL HIGH (ref 70–99)
Glucose-Capillary: 96 mg/dL (ref 70–99)
Glucose-Capillary: 117 mg/dL — ABNORMAL HIGH (ref 70–99)

## 2018-07-11 MED ORDER — AMIODARONE HCL IN DEXTROSE 360-4.14 MG/200ML-% IV SOLN
60.0000 mg/h | INTRAVENOUS | Status: DC
  Administered 2018-07-11: 60 mg/h via INTRAVENOUS

## 2018-07-11 MED ORDER — FUROSEMIDE 40 MG PO TABS
40.0000 mg | ORAL_TABLET | Freq: Every day | ORAL | Status: DC
  Administered 2018-07-12 – 2018-07-15 (×4): 40 mg via ORAL
  Filled 2018-07-11 (×4): qty 1

## 2018-07-11 MED ORDER — POTASSIUM CHLORIDE CRYS ER 20 MEQ PO TBCR
20.0000 meq | EXTENDED_RELEASE_TABLET | ORAL | Status: DC | PRN
  Administered 2018-07-11 (×2): 20 meq via ORAL
  Filled 2018-07-11 (×3): qty 1

## 2018-07-11 MED ORDER — AMIODARONE HCL 200 MG PO TABS
400.0000 mg | ORAL_TABLET | Freq: Two times a day (BID) | ORAL | Status: DC
  Administered 2018-07-11 – 2018-07-15 (×8): 400 mg via ORAL
  Filled 2018-07-11 (×8): qty 2

## 2018-07-11 MED ORDER — ALBUMIN HUMAN 5 % IV SOLN
INTRAVENOUS | Status: AC
  Administered 2018-07-11: 12.5 g via INTRAVENOUS
  Filled 2018-07-11: qty 250

## 2018-07-11 MED ORDER — INSULIN ASPART 100 UNIT/ML ~~LOC~~ SOLN: 0.0000 [IU] | Freq: Every day | SUBCUTANEOUS | Status: DC

## 2018-07-11 MED ORDER — ALBUMIN HUMAN 5 % IV SOLN
12.5000 g | Freq: Once | INTRAVENOUS | Status: AC
  Administered 2018-07-11: 12.5 g via INTRAVENOUS

## 2018-07-11 MED ORDER — POTASSIUM CHLORIDE CRYS ER 20 MEQ PO TBCR
20.0000 meq | EXTENDED_RELEASE_TABLET | Freq: Two times a day (BID) | ORAL | Status: DC
  Administered 2018-07-11 – 2018-07-15 (×9): 20 meq via ORAL
  Filled 2018-07-11 (×9): qty 1

## 2018-07-11 MED ORDER — AMIODARONE HCL IN DEXTROSE 360-4.14 MG/200ML-% IV SOLN
30.0000 mg/h | INTRAVENOUS | Status: AC
  Administered 2018-07-11: 30 mg/h via INTRAVENOUS
  Filled 2018-07-11: qty 200

## 2018-07-11 MED ORDER — INSULIN ASPART 100 UNIT/ML ~~LOC~~ SOLN
0.0000 [IU] | Freq: Three times a day (TID) | SUBCUTANEOUS | Status: DC
  Administered 2018-07-11: 3 [IU] via SUBCUTANEOUS
  Administered 2018-07-13 – 2018-07-14 (×2): 2 [IU] via SUBCUTANEOUS

## 2018-07-11 MED ORDER — AMIODARONE HCL IN DEXTROSE 360-4.14 MG/200ML-% IV SOLN
INTRAVENOUS | Status: AC
  Administered 2018-07-11: 60 mg/h via INTRAVENOUS
  Filled 2018-07-11: qty 200

## 2018-07-11 MED ORDER — ENOXAPARIN SODIUM 40 MG/0.4ML ~~LOC~~ SOLN
40.0000 mg | SUBCUTANEOUS | Status: DC
  Administered 2018-07-11 – 2018-07-14 (×4): 40 mg via SUBCUTANEOUS
  Filled 2018-07-11 (×5): qty 0.4

## 2018-07-11 NOTE — Progress Notes (Signed)
3 Days Post-Op Procedure(s) (LRB): CORONARY ARTERY BYPASS GRAFTING (CABG)x 4,using left internal mammary artery and right leg greater saphenous vein harvested endoscopically (N/A) TRANSESOPHAGEAL ECHOCARDIOGRAM (TEE) (N/A) Subjective: Developed atrial fibrillation while walking in hallway IV amiodarone started Chest x-ray clear Edema improved Bradycardia transfer to stepdown when able arrhythmias resolved  Objective: Vital signs in last 24 hours: Temp:  [98 F (36.7 C)-99.4 F (37.4 C)] 98.1 F (36.7 C) (02/15 0749) Pulse Rate:  [73-161] 161 (02/15 0840) Cardiac Rhythm: Atrial fibrillation (02/15 0840) Resp:  [15-28] 25 (02/15 0840) BP: (97-124)/(56-76) 107/72 (02/15 0700) SpO2:  [91 %-100 %] 97 % (02/15 0840) Weight:  [82 kg] 82 kg (02/15 0410)  Hemodynamic parameters for last 24 hours:  Stable Intake/Output from previous day: 02/14 0701 - 02/15 0700 In: 1500.9 [P.O.:1440; I.V.:9.7; IV Piggyback:51.3] Out: 1460 [Urine:1450; Chest Tube:10] Intake/Output this shift: No intake/output data recorded.       Exam    General- alert and comfortable    Neck- no JVD, no cervical adenopathy palpable, no carotid bruit   Lungs- clear without rales, wheezes   Cor- regular rate and rhythm, no murmur , gallop   Abdomen- soft, non-tender   Extremities - warm, non-tender, minimal edema   Neuro- oriented, appropriate, no focal weakness   Lab Results: Recent Labs    07/10/18 0417 07/11/18 0250  WBC 12.7* 12.7*  HGB 8.6* 8.6*  HCT 25.9* 25.8*  PLT 144* 194   BMET:  Recent Labs    07/10/18 0317 07/11/18 0250  NA 131* 138  K 3.8 3.2*  CL 98 101  CO2 25 30  GLUCOSE 406* 110*  BUN 14 16  CREATININE 0.81 0.76  CALCIUM 7.4* 8.0*    PT/INR:  Recent Labs    07/08/18 1458  LABPROT 16.5*  INR 1.35   ABG    Component Value Date/Time   PHART 7.334 (L) 07/08/2018 1958   HCO3 21.6 07/08/2018 1958   TCO2 23 07/08/2018 1958   ACIDBASEDEF 4.0 (H) 07/08/2018 1958   O2SAT  96.0 07/08/2018 1958   CBG (last 3)  Recent Labs    07/10/18 2355 07/11/18 0427 07/11/18 0751  GLUCAP 108* 130* 119*    Assessment/Plan: S/P Procedure(s) (LRB): CORONARY ARTERY BYPASS GRAFTING (CABG)x 4,using left internal mammary artery and right leg greater saphenous vein harvested endoscopically (N/A) TRANSESOPHAGEAL ECHOCARDIOGRAM (TEE) (N/A) Treat atrial fibrillation with amiodarone Transition to oral Lasix In ICU today  LOS: 11 days    Tharon Aquas Trigt III 07/11/2018

## 2018-07-11 NOTE — Plan of Care (Signed)
  Problem: Health Behavior/Discharge Planning: Goal: Ability to manage health-related needs will improve Outcome: Progressing   Problem: Clinical Measurements: Goal: Ability to maintain clinical measurements within normal limits will improve Outcome: Progressing Goal: Will remain free from infection Outcome: Progressing Goal: Diagnostic test results will improve Outcome: Progressing Goal: Respiratory complications will improve Outcome: Progressing Goal: Cardiovascular complication will be avoided Outcome: Progressing   Problem: Activity: Goal: Risk for activity intolerance will decrease Outcome: Progressing   Problem: Nutrition: Goal: Adequate nutrition will be maintained Outcome: Progressing   Problem: Elimination: Goal: Will not experience complications related to bowel motility Outcome: Progressing Goal: Will not experience complications related to urinary retention Outcome: Progressing   Problem: Pain Managment: Goal: General experience of comfort will improve Outcome: Progressing   Problem: Safety: Goal: Ability to remain free from injury will improve Outcome: Progressing   Problem: Skin Integrity: Goal: Risk for impaired skin integrity will decrease Outcome: Progressing   Problem: Education: Goal: Ability to manage disease process will improve Outcome: Progressing   Problem: Cardiac: Goal: Ability to achieve and maintain adequate cardiopulmonary perfusion will improve Outcome: Progressing   Problem: Neurologic: Goal: Promote progressive neurologic recovery Outcome: Progressing   Problem: Skin Integrity: Goal: Risk for impaired skin integrity will be minimized. Outcome: Progressing   Problem: Education: Goal: Understanding of CV disease, CV risk reduction, and recovery process will improve Outcome: Progressing Goal: Individualized Educational Video(s) Outcome: Progressing   Problem: Activity: Goal: Ability to return to baseline activity level will  improve Outcome: Progressing   Problem: Cardiovascular: Goal: Ability to achieve and maintain adequate cardiovascular perfusion will improve Outcome: Progressing Goal: Vascular access site(s) Level 0-1 will be maintained Outcome: Progressing   Problem: Health Behavior/Discharge Planning: Goal: Ability to safely manage health-related needs after discharge will improve Outcome: Progressing

## 2018-07-11 NOTE — Progress Notes (Signed)
CT surgery p.m. Rounds  Patient is maintained sinus rhythm since this a.m. We will transition from IV to oral amiodarone Otherwise patient had stable day, ambulated x2 Bowel movement today

## 2018-07-12 LAB — CBC
HCT: 26.1 % — ABNORMAL LOW (ref 39.0–52.0)
Hemoglobin: 8.5 g/dL — ABNORMAL LOW (ref 13.0–17.0)
MCH: 29.1 pg (ref 26.0–34.0)
MCHC: 32.6 g/dL (ref 30.0–36.0)
MCV: 89.4 fL (ref 80.0–100.0)
Platelets: 205 10*3/uL (ref 150–400)
RBC: 2.92 MIL/uL — ABNORMAL LOW (ref 4.22–5.81)
RDW: 14.2 % (ref 11.5–15.5)
WBC: 8.8 10*3/uL (ref 4.0–10.5)
nRBC: 0 % (ref 0.0–0.2)

## 2018-07-12 LAB — BASIC METABOLIC PANEL
Anion gap: 5 (ref 5–15)
BUN: 15 mg/dL (ref 8–23)
CO2: 32 mmol/L (ref 22–32)
Calcium: 8.5 mg/dL — ABNORMAL LOW (ref 8.9–10.3)
Chloride: 101 mmol/L (ref 98–111)
Creatinine, Ser: 0.8 mg/dL (ref 0.61–1.24)
GFR calc Af Amer: >60 mL/min (ref >60)
GFR calc non Af Amer: >60 mL/min (ref >60)
Glucose, Bld: 118 mg/dL — ABNORMAL HIGH (ref 70–99)
Potassium: 3.7 mmol/L (ref 3.5–5.1)
Sodium: 138 mmol/L (ref 135–145)

## 2018-07-12 LAB — GLUCOSE, CAPILLARY
Glucose-Capillary: 106 mg/dL — ABNORMAL HIGH (ref 70–99)
Glucose-Capillary: 117 mg/dL — ABNORMAL HIGH (ref 70–99)
Glucose-Capillary: 84 mg/dL (ref 70–99)
Glucose-Capillary: 144 mg/dL — ABNORMAL HIGH (ref 70–99)

## 2018-07-12 NOTE — Progress Notes (Signed)
07/12/2018 1300 Received pt to room 4E-12 from [place].  Pt is A&O, no C/O anything at this time.  Tele monitor applied and CCMD notified.  CHG bath given.  Did not on assessment the IV in the R arm is red and swollen.  IV discontinued and arm placed up on a pillow with a warm compress on it.  Will monitor.  Oriented pt to room, call light and bed.  Call bell in reach. [place]

## 2018-07-12 NOTE — Progress Notes (Addendum)
4 Days Post-Op Procedure(s) (LRB): CORONARY ARTERY BYPASS GRAFTING (CABG)x 4,using left internal mammary artery and right leg greater saphenous vein harvested endoscopically (N/A) TRANSESOPHAGEAL ECHOCARDIOGRAM (TEE) (N/A) Subjective: Maintaining nsr on po amio approaching preop wt, small bilat effusions Walked in hall  Objective: Vital signs in last 24 hours: Temp:  [98.2 F (36.8 C)-99.2 F (37.3 C)] 98.3 F (36.8 C) (02/16 0752) Pulse Rate:  [51-84] 84 (02/16 1000) Cardiac Rhythm: Normal sinus rhythm (02/16 0900) Resp:  [15-30] 25 (02/16 1000) BP: (85-133)/(42-100) 124/100 (02/16 1000) SpO2:  [86 %-98 %] 91 % (02/16 1000) Weight:  [76.9 kg] 76.9 kg (02/16 0500)  Hemodynamic parameters for last 24 hours:  stable  Intake/Output from previous day: 02/15 0701 - 02/16 0700 In: 1048 [P.O.:480; I.V.:326.5; IV Piggyback:241.5] Out: -  Intake/Output this shift: Total I/O In: 360 [P.O.:360] Out: -        Exam    General- alert and comfortable    Neck- no JVD, no cervical adenopathy palpable, no carotid bruit   Lungs- clear without rales, wheezes   Cor- regular rate and rhythm, no murmur , gallop   Abdomen- soft, non-tender   Extremities - warm, non-tender, minimal edema   Neuro- oriented, appropriate, no focal weakness   Lab Results: Recent Labs    07/11/18 0250 07/12/18 0432  WBC 12.7* 8.8  HGB 8.6* 8.5*  HCT 25.8* 26.1*  PLT 194 205   BMET:  Recent Labs    07/11/18 0250 07/12/18 0432  NA 138 138  K 3.2* 3.7  CL 101 101  CO2 30 32  GLUCOSE 110* 118*  BUN 16 15  CREATININE 0.76 0.80  CALCIUM 8.0* 8.5*    PT/INR: No results for input(s): LABPROT, INR in the last 72 hours. ABG    Component Value Date/Time   PHART 7.334 (L) 07/08/2018 1958   HCO3 21.6 07/08/2018 1958   TCO2 23 07/08/2018 1958   ACIDBASEDEF 4.0 (H) 07/08/2018 1958   O2SAT 96.0 07/08/2018 1958   CBG (last 3)  Recent Labs    07/11/18 1628 07/11/18 2130 07/12/18 0754  GLUCAP 96  117* 106*    Assessment/Plan: S/P Procedure(s) (LRB): CORONARY ARTERY BYPASS GRAFTING (CABG)x 4,using left internal mammary artery and right leg greater saphenous vein harvested endoscopically (N/A) TRANSESOPHAGEAL ECHOCARDIOGRAM (TEE) (N/A) Mobilize Diuresis Plan for transfer to step-down: see transfer orders hold NOAC at this point   LOS: 12 days    Tharon Aquas Trigt III 07/12/2018

## 2018-07-13 ENCOUNTER — Inpatient Hospital Stay (HOSPITAL_COMMUNITY): Payer: Medicare HMO

## 2018-07-13 LAB — CBC
HCT: 26.9 % — ABNORMAL LOW (ref 39.0–52.0)
Hemoglobin: 9.1 g/dL — ABNORMAL LOW (ref 13.0–17.0)
MCH: 30.1 pg (ref 26.0–34.0)
MCHC: 33.8 g/dL (ref 30.0–36.0)
MCV: 89.1 fL (ref 80.0–100.0)
Platelets: 359 10*3/uL (ref 150–400)
RBC: 3.02 MIL/uL — ABNORMAL LOW (ref 4.22–5.81)
RDW: 14.2 % (ref 11.5–15.5)
WBC: 9.2 10*3/uL (ref 4.0–10.5)
nRBC: 0 % (ref 0.0–0.2)

## 2018-07-13 LAB — BASIC METABOLIC PANEL
Anion gap: 10 (ref 5–15)
BUN: 16 mg/dL (ref 8–23)
CO2: 29 mmol/L (ref 22–32)
Calcium: 8.7 mg/dL — ABNORMAL LOW (ref 8.9–10.3)
Chloride: 99 mmol/L (ref 98–111)
Creatinine, Ser: 0.75 mg/dL (ref 0.61–1.24)
GFR calc Af Amer: >60 mL/min (ref >60)
GFR calc non Af Amer: >60 mL/min (ref >60)
Glucose, Bld: 106 mg/dL — ABNORMAL HIGH (ref 70–99)
Potassium: 3.4 mmol/L — ABNORMAL LOW (ref 3.5–5.1)
Sodium: 138 mmol/L (ref 135–145)

## 2018-07-13 LAB — GLUCOSE, CAPILLARY
GLUCOSE-CAPILLARY: 93 mg/dL (ref 70–99)
Glucose-Capillary: 103 mg/dL — ABNORMAL HIGH (ref 70–99)
Glucose-Capillary: 132 mg/dL — ABNORMAL HIGH (ref 70–99)
Glucose-Capillary: 130 mg/dL — ABNORMAL HIGH (ref 70–99)

## 2018-07-13 MED ORDER — POTASSIUM CHLORIDE CRYS ER 20 MEQ PO TBCR
20.0000 meq | EXTENDED_RELEASE_TABLET | Freq: Once | ORAL | Status: AC
  Administered 2018-07-13: 20 meq via ORAL
  Filled 2018-07-13: qty 1

## 2018-07-13 NOTE — Progress Notes (Signed)
CARDIAC REHAB PHASE I   PRE:  Rate/Rhythm: 70 SR    BP: sitting 123/79    SaO2: 96 RA (was 91 in bed earlier)  MODE:  Ambulation: 610 ft   POST:  Rate/Rhythm: 83 SR    BP: sitting 118/85     SaO2: 96 RA  Pt moving well. Able to stand and walk with standby assist. No RW needed.  No major c/o, VSS. Encouraged x2 more walks and more IS (only 750 mL, not doing often). Also encouraged recliner or sitting up more. Will await plan and support person for education. [id number]  [place], ACSM 07/13/2018 11:19 AM

## 2018-07-13 NOTE — Progress Notes (Signed)
      [address]       [place] [address]             [phone]      5 Days Post-Op Procedure(s) (LRB): CORONARY ARTERY BYPASS GRAFTING (CABG)x 4,using left internal mammary artery and right leg greater saphenous vein harvested endoscopically (N/A) TRANSESOPHAGEAL ECHOCARDIOGRAM (TEE) (N/A) Subjective: No issues overnight.   Objective: Vital signs in last 24 hours: Temp:  [98.2 F (36.8 C)-98.6 F (37 C)] 98.2 F (36.8 C) (02/17 0733) Pulse Rate:  [67-84] 76 (02/17 0733) Cardiac Rhythm: Normal sinus rhythm (02/17 0700) Resp:  [16-25] 20 (02/17 0733) BP: (110-133)/(55-100) 115/57 (02/17 0733) SpO2:  [91 %-100 %] 94 % (02/17 0733) Weight:  [76.5 kg] 76.5 kg (02/17 0449)     Intake/Output from previous day: 02/16 0701 - 02/17 0700 In: 460 [P.O.:460] Out: 2 [Urine:2] Intake/Output this shift: No intake/output data recorded.  General appearance: alert, cooperative and no distress Heart: regular rate and rhythm, S1, S2 normal, no murmur, click, rub or gallop Lungs: clear to auscultation bilaterally Abdomen: soft, non-tender; bowel sounds normal; no masses,  no organomegaly Extremities: extremities normal, atraumatic, no cyanosis or edema Wound: clean and dry  Lab Results: Recent Labs    07/12/18 0432 07/13/18 0334  WBC 8.8 9.2  HGB 8.5* 9.1*  HCT 26.1* 26.9*  PLT 205 359   BMET:  Recent Labs    07/12/18 0432 07/13/18 0334  NA 138 138  K 3.7 3.4*  CL 101 99  CO2 32 29  GLUCOSE 118* 106*  BUN 15 16  CREATININE 0.80 0.75  CALCIUM 8.5* 8.7*    PT/INR: No results for input(s): LABPROT, INR in the last 72 hours. ABG    Component Value Date/Time   PHART 7.334 (L) 07/08/2018 1958   HCO3 21.6 07/08/2018 1958   TCO2 23 07/08/2018 1958   ACIDBASEDEF 4.0 (H) 07/08/2018 1958   O2SAT 96.0 07/08/2018 1958   CBG (last 3)  Recent Labs    07/12/18 1632 07/12/18 2222 07/13/18 0612  GLUCAP 84 144* 103*    Assessment/Plan: S/P  Procedure(s) (LRB): CORONARY ARTERY BYPASS GRAFTING (CABG)x 4,using left internal mammary artery and right leg greater saphenous vein harvested endoscopically (N/A) TRANSESOPHAGEAL ECHOCARDIOGRAM (TEE) (N/A)  1. CV-NSR in the 70s, BP well controlled. Continue ASA, Amio, Lipitor, and Lopressor.  2. Pulm-CXR looks improved. Continue to encouraged incentive spirometer.  3. Renal-creatinine 0.75, hypokalemia-replace 4. H and H, 9.1/26.9, platelets 359k 5. Endo-blood glucose 84, 144,103  Plan: Discontinue EPW-rhythm has been stable. He is tolerating room air now and ambulating in the halls with limited assistance. Replacing potassium and continue diuresis for fluid overload. Likely discharge in the next day or two. He will work on arranging care today for when he goes home.    LOS: 13 days    Elgie Collard 07/13/2018

## 2018-07-13 NOTE — Care Management Important Message (Signed)
Important Message  Patient Details  Name: Daniel Holland MRN: [id number] Date of Birth: 09-08-1950   Medicare Important Message Given:  Yes    [name] 07/13/2018, 4:24 PM

## 2018-07-13 NOTE — Progress Notes (Signed)
PT epw wires pulled per protocol and as ordered. All ends intact. Patient reminded to lie supine approximately one hour. bp 110/72 hear rte 65 on monitor . Will monitor patient. Selita Staiger, Bettina Gavia RN

## 2018-07-13 NOTE — Progress Notes (Signed)
Patient ambulated in hallway with nursing staff 500 feet. With walker on room air. Patient sats dipped to 86 while ambulating on room air at the end of walk  but when back in room at rest Sats came up to 94% on room air. Will monitor patient. Jadriel Saxer, Bettina Gavia rN

## 2018-07-13 NOTE — Plan of Care (Signed)
  Problem: Health Behavior/Discharge Planning: Goal: Ability to manage health-related needs will improve Outcome: Progressing   Problem: Clinical Measurements: Goal: Diagnostic test results will improve Outcome: Progressing   Problem: Pain Managment: Goal: General experience of comfort will improve Outcome: Progressing   Problem: Cardiac: Goal: Ability to achieve and maintain adequate cardiopulmonary perfusion will improve Outcome: Progressing

## 2018-07-14 LAB — GLUCOSE, CAPILLARY
GLUCOSE-CAPILLARY: 145 mg/dL — AB (ref 70–99)
Glucose-Capillary: 99 mg/dL (ref 70–99)
Glucose-Capillary: 94 mg/dL (ref 70–99)
Glucose-Capillary: 107 mg/dL — ABNORMAL HIGH (ref 70–99)

## 2018-07-14 MED FILL — Electrolyte-R (PH 7.4) Solution: INTRAVENOUS | Qty: 3000 | Status: AC

## 2018-07-14 MED FILL — Mannitol IV Soln 20%: INTRAVENOUS | Qty: 500 | Status: AC

## 2018-07-14 MED FILL — Sodium Bicarbonate IV Soln 8.4%: INTRAVENOUS | Qty: 50 | Status: AC

## 2018-07-14 MED FILL — Heparin Sodium (Porcine) Inj 1000 Unit/ML: INTRAMUSCULAR | Qty: 20 | Status: AC

## 2018-07-14 MED FILL — Lidocaine HCl(Cardiac) IV PF Soln Pref Syr 100 MG/5ML (2%): INTRAVENOUS | Qty: 5 | Status: AC

## 2018-07-14 MED FILL — Sodium Chloride IV Soln 0.9%: INTRAVENOUS | Qty: 2000 | Status: AC

## 2018-07-14 NOTE — Progress Notes (Signed)
CARDIAC REHAB PHASE I   PRE:  Rate/Rhythm: 65 SR  BP:  Supine: 109/53  Sitting:   Standing:    SaO2: 93 % RA  MODE:  Ambulation: 470 ft   POST:  Rate/Rhythm: 65 SR  BP:  Supine:   Sitting: 108/65  Standing:    SaO2: 95% RA  1300- 1350  Pt assisted to ambulate with stand by assist for 470 ft with no assistive devices. Pt reported no signs or symptoms. Pt educated on movement and restrictions as well as sternal precautions. Pt also educated on heart healthy and diabetic diets, exercise guidelines, and IS use with teachback. Pt educated on CRPII and referral order placed. Pt also educated on lasix and side effects. Pt verbalized understanding could use reinforcement. CRPII referral order placed to [facility].  Towanda Malkin RN, BSN 07/14/2018 1:42 PM

## 2018-07-14 NOTE — Progress Notes (Addendum)
      [address]       [place] [address]             [phone]      6 Days Post-Op Procedure(s) (LRB): CORONARY ARTERY BYPASS GRAFTING (CABG)x 4,using left internal mammary artery and right leg greater saphenous vein harvested endoscopically (N/A) TRANSESOPHAGEAL ECHOCARDIOGRAM (TEE) (N/A) Subjective: Irritated this morning because his phone is dead and he can't get a hold of anyone since he doesn't know the numbers.  Objective: Vital signs in last 24 hours: Temp:  [98.1 F (36.7 C)-98.6 F (37 C)] 98.2 F (36.8 C) (02/18 0431) Pulse Rate:  [64-80] 64 (02/18 0431) Cardiac Rhythm: Normal sinus rhythm (02/17 2022) Resp:  [16-23] 18 (02/18 0431) BP: (105-133)/(62-85) 108/62 (02/18 0431) SpO2:  [92 %-96 %] 94 % (02/18 0431) Weight:  [79.5 kg] 79.5 kg (02/18 0431)     Intake/Output from previous day: No intake/output data recorded. Intake/Output this shift: No intake/output data recorded.  General appearance: alert, cooperative and no distress Heart: regular rate and rhythm, S1, S2 normal, no murmur, click, rub or gallop Lungs: clear to auscultation bilaterally Abdomen: soft, non-tender; bowel sounds normal; no masses,  no organomegaly Extremities: extremities normal, atraumatic, no cyanosis or edema Wound: clean and dry  Lab Results: Recent Labs    07/12/18 0432 07/13/18 0334  WBC 8.8 9.2  HGB 8.5* 9.1*  HCT 26.1* 26.9*  PLT 205 359   BMET:  Recent Labs    07/12/18 0432 07/13/18 0334  NA 138 138  K 3.7 3.4*  CL 101 99  CO2 32 29  GLUCOSE 118* 106*  BUN 15 16  CREATININE 0.80 0.75  CALCIUM 8.5* 8.7*    PT/INR: No results for input(s): LABPROT, INR in the last 72 hours. ABG    Component Value Date/Time   PHART 7.334 (L) 07/08/2018 1958   HCO3 21.6 07/08/2018 1958   TCO2 23 07/08/2018 1958   ACIDBASEDEF 4.0 (H) 07/08/2018 1958   O2SAT 96.0 07/08/2018 1958   CBG (last 3)  Recent Labs    07/13/18 1621 07/13/18 2101  07/14/18 0618  GLUCAP 93 130* 99    Assessment/Plan: S/P Procedure(s) (LRB): CORONARY ARTERY BYPASS GRAFTING (CABG)x 4,using left internal mammary artery and right leg greater saphenous vein harvested endoscopically (N/A) TRANSESOPHAGEAL ECHOCARDIOGRAM (TEE) (N/A)  1. CV-NSR in the 80s, BP well controlled. Continue ASA, Amio, Lipitor, and Lopressor.  2. Pulm-CXR looks improved. Continue to encouraged incentive spirometer.  3. Renal-creatinine 0.75, hypokalemia-replace 4. H and H, 9.1/26.9, platelets 359k 5. Endo-blood glucose 93,130,99  Plan: Home tomorrow if there are no changes. Walking independently in the halls.    LOS: 14 days    Daniel Holland 07/14/2018 patient examined and medical record reviewed,agree with above note. Daniel Holland 07/14/2018

## 2018-07-15 ENCOUNTER — Encounter (HOSPITAL_COMMUNITY): Payer: Self-pay | Admitting: *Deleted

## 2018-07-15 LAB — GLUCOSE, CAPILLARY: Glucose-Capillary: 98 mg/dL (ref 70–99)

## 2018-07-15 MED ORDER — AMIODARONE HCL 200 MG PO TABS: 200.0000 mg | ORAL_TABLET | Freq: Two times a day (BID) | ORAL | 1 refills | Status: DC

## 2018-07-15 MED ORDER — ASPIRIN 325 MG PO TBEC: 325.0000 mg | DELAYED_RELEASE_TABLET | Freq: Every day | ORAL | 0 refills | Status: DC

## 2018-07-15 MED ORDER — METOPROLOL TARTRATE 25 MG PO TABS: 12.5000 mg | ORAL_TABLET | Freq: Two times a day (BID) | ORAL | 1 refills | Status: DC

## 2018-07-15 MED ORDER — OXYCODONE HCL 5 MG PO TABS: 5.0000 mg | ORAL_TABLET | Freq: Four times a day (QID) | ORAL | 0 refills | Status: DC | PRN

## 2018-07-15 MED FILL — Magnesium Sulfate Inj 50%: INTRAMUSCULAR | Qty: 10 | Status: AC

## 2018-07-15 MED FILL — Heparin Sodium (Porcine) Inj 1000 Unit/ML: INTRAMUSCULAR | Qty: 30 | Status: AC

## 2018-07-15 MED FILL — Potassium Chloride Inj 2 mEq/ML: INTRAVENOUS | Qty: 40 | Status: AC

## 2018-07-15 NOTE — Care Management Note (Signed)
Case Management Note Previous CM note completed by Georgeanna Lea RN, CM 07/07/2018 3:45pm   Patient Details  Name: Daniel Holland MRN: [id number] Date of Birth: 05/22/1951  Subjective/Objective: 68 yo male presented after sustaining a cardiac arrest (PEA). PMH: HTN, HLD, DM, and prior CVA                   Action/Plan: CM following for dispositional needs. Patient states living at home alone and being independent with ADLs PTA. PCP verified as: Yaakov Guthrie; pharmacy of choice: [facility]. Patient is scheduled for a CABG on 07/08/18 and has indicated his sister and brother will be arriving in town to assist during hospitalization/recovery; patient states having a large amount of friends available to provide support as needed. PT/OT eval complete with CIR recommended; CM discussed recommendations and options with patient agreeable to CIR if recommended after surgery. CM team will continue to follow.   Expected Discharge Date:  07/15/18               Expected Discharge Plan:  Home/Self Care  In-House Referral:  NA  Discharge planning Services  CM Consult  Post Acute Care Choice:  IP Rehab Choice offered to:  Patient  DME Arranged:  N/A DME Agency:  NA  HH Arranged:  NA HH Agency:  NA  Status of Service:  Completed, signed off  If discussed at Long Length of Stay Meetings, dates discussed:    Discharge Disposition: home/self care   Additional Comments:  07/15/18- 1015- Marvetta Gibbons RN, CM- pt s/p CABG 2/12 and has progressed will post op, ambulated with stand by assist per Cardiac rehab with no assist device. Post op has progressed to point of not needing intensive IP rehab services and plan will be to return home. Pt's friend Aaron Edelman has arranged assistance at home for pt. No CM needs noted for transition home.    Marvetta Gibbons RN, BSN Transitions of Care Unit 4E- RN Case Manager [phone] 07/15/2018, 10:14 AM

## 2018-07-15 NOTE — Plan of Care (Signed)
  Problem: Clinical Measurements: Goal: Ability to maintain clinical measurements within normal limits will improve Outcome: Progressing Goal: Cardiovascular complication will be avoided Outcome: Progressing   Problem: Skin Integrity: Goal: Risk for impaired skin integrity will decrease Outcome: Progressing   Problem: Neurologic: Goal: Promote progressive neurologic recovery Outcome: Progressing

## 2018-07-15 NOTE — Consult Note (Signed)
Daniel Holland Nurse wound consult note Patient receiving care in [name].  No family present. 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 Today, there is a very small, narrow pink, linear area on the chin.  Most of the area has re-epithelialized.  I do not see any wound area on the right jaw.  The area to the chin is being treated with TID Bacitracin ointment.  No further interventions necessary at this time. Monitor the wound area(s) for worsening of condition such as: Signs/symptoms of infection,  Increase in size,  Development of or worsening of odor, Development of pain, or increased pain at the affected locations.  Notify the medical team if any of these develop.  Val Riles, RN, MSN, CWOCN, CNS-BC, pager [phone]

## 2018-07-15 NOTE — Progress Notes (Signed)
      [address]       ,[place] [address]             [phone]      7 Days Post-Op Procedure(s) (LRB): CORONARY ARTERY BYPASS GRAFTING (CABG)x 4,using left internal mammary artery and right leg greater saphenous vein harvested endoscopically (N/A) TRANSESOPHAGEAL ECHOCARDIOGRAM (TEE) (N/A) Subjective: Feels good this morning. Ready to go home.  Objective: Vital signs in last 24 hours: Temp:  [97.5 F (36.4 C)-98.4 F (36.9 C)] 97.5 F (36.4 C) (02/19 0421) Pulse Rate:  [61-67] 67 (02/19 0421) Cardiac Rhythm: Sinus bradycardia (02/19 0700) Resp:  [15-29] 15 (02/19 0500) BP: (109-140)/(63-78) 140/74 (02/19 0421) SpO2:  [95 %-97 %] 97 % (02/19 0421) Weight:  [78.2 kg] 78.2 kg (02/19 0453)     Intake/Output from previous day: 02/18 0701 - 02/19 0700 In: 410 [P.O.:410] Out: -  Intake/Output this shift: No intake/output data recorded.  General appearance: alert, cooperative and no distress Heart: regular rate and rhythm, S1, S2 normal, no murmur, click, rub or gallop Lungs: clear to auscultation bilaterally Abdomen: soft, non-tender; bowel sounds normal; no masses,  no organomegaly Extremities: extremities normal, atraumatic, no cyanosis or edema Wound: clean and dry  Lab Results: Recent Labs    07/13/18 0334  WBC 9.2  HGB 9.1*  HCT 26.9*  PLT 359   BMET:  Recent Labs    07/13/18 0334  NA 138  K 3.4*  CL 99  CO2 29  GLUCOSE 106*  BUN 16  CREATININE 0.75  CALCIUM 8.7*    PT/INR: No results for input(s): LABPROT, INR in the last 72 hours. ABG    Component Value Date/Time   PHART 7.334 (L) 07/08/2018 1958   HCO3 21.6 07/08/2018 1958   TCO2 23 07/08/2018 1958   ACIDBASEDEF 4.0 (H) 07/08/2018 1958   O2SAT 96.0 07/08/2018 1958   CBG (last 3)  Recent Labs    07/14/18 1605 07/14/18 2059 07/15/18 0603  GLUCAP 94 107* 98    Assessment/Plan: S/P Procedure(s) (LRB): CORONARY ARTERY BYPASS GRAFTING (CABG)x 4,using left internal  mammary artery and right leg greater saphenous vein harvested endoscopically (N/A) TRANSESOPHAGEAL ECHOCARDIOGRAM (TEE) (N/A)  1. CV-NSR in the 70s, BP well controlled. Continue ASA, Amio, Lipitor, and Lopressor.  2. Pulm-CXR looks improved. Continue to encouraged incentive spirometer.  3. Renal-creatinine 0.75, hypokalemia-replace 4. H and H, 9.1/26.9, platelets 359k 5. Endo-blood glucose 94, 107, 98  Plan: Home today. Provided discharge instructions. We will wait to remove chest tube stitches when he returns to the office.   LOS: 15 days    Elgie Collard 07/15/2018

## 2018-07-15 NOTE — Progress Notes (Signed)
Discharge AVS meds take and those due reviewed with pt. Follow up appointments and when to call MD reviewed. All questions and concerns addressed. No further questions at this time. D/c IV and TELE, CCMD notified. D/C home per orders. Pt brought down via wheelchair with staff.  

## 2018-07-20 ENCOUNTER — Telehealth (HOSPITAL_COMMUNITY): Payer: Self-pay

## 2018-07-20 NOTE — Telephone Encounter (Signed)
Pt insurance is active and benefits verified through [employer]. Co-pay $45.00, DED $0.00/$0.00 met, out of pocket $4,200.00/$245.00 met, co-insurance 0%. No pre-authorization required. Joseph/[employer], 07/20/2018 @ 3:54PM, REF# [id number]  Will contact patient to see if he is interested in the Cardiac Rehab Program. If interested, patient will need to complete follow up appt. Once completed, patient will be contacted for scheduling upon review by the RN Navigator.

## 2018-07-20 NOTE — Telephone Encounter (Signed)
Attempted to call patient in regards to Cardiac Rehab - LM on VM 

## 2018-07-27 ENCOUNTER — Ambulatory Visit: Payer: Medicare HMO | Admitting: Physician Assistant

## 2018-08-03 ENCOUNTER — Other Ambulatory Visit: Payer: Self-pay | Admitting: Cardiothoracic Surgery

## 2018-08-03 DIAGNOSIS — Z951 Presence of aortocoronary bypass graft: Secondary | ICD-10-CM

## 2018-08-04 ENCOUNTER — Ambulatory Visit (INDEPENDENT_AMBULATORY_CARE_PROVIDER_SITE_OTHER): Payer: Self-pay | Admitting: Physician Assistant

## 2018-08-04 ENCOUNTER — Other Ambulatory Visit: Payer: Self-pay

## 2018-08-04 ENCOUNTER — Ambulatory Visit
Admission: RE | Admit: 2018-08-04 | Discharge: 2018-08-04 | Disposition: A | Discharge status: Home / Self Care | Payer: Medicare HMO | Source: Ambulatory Visit | Attending: Cardiothoracic Surgery | Admitting: Cardiothoracic Surgery

## 2018-08-04 VITALS — BP 142/82 | HR 65 | Resp 18 | Ht 67.75 in | Wt 175.8 lb

## 2018-08-04 DIAGNOSIS — Z951 Presence of aortocoronary bypass graft: Secondary | ICD-10-CM

## 2018-08-04 DIAGNOSIS — J90 Pleural effusion, not elsewhere classified: Secondary | ICD-10-CM | POA: Diagnosis not present

## 2018-08-04 NOTE — Progress Notes (Signed)
[address]       [place], [address]             [phone]       Daniel Holland is a 68 y.o. male patient s/p CABG x 4 on 07/08/2018.  His postoperative course was complicated by atrial fibrillation on 07/11/2018.  He was treated with the IV amiodarone protocol and is now currently on a 200 mg twice daily maintenance dose.  He is here for his routine 4 week follow-up visit.    No diagnosis found. Past Medical History:  Diagnosis Date  . Chronic kidney disease    left ureteral stone  . CVA (cerebral vascular accident) ([place])   . Diabetes ([place])    Metformin  . Dysphagia   . Frequency of urination   . Hematuria   . History of kidney stones    RIGHT SIDE 2007-  PASSED SPONTATEOUS  . HLD (hyperlipidemia)   . HTN (hypertension)   . Hyperlipemia   . Hypertension    CURRENTLY NO MEDS  . Pedal edema   . Right ureteral stone   . Seasonal allergies   . Seborrheic keratosis   . Stroke [facility]) 03/2015   episode was this date and saw Neurologist in Feb 2017 and had MRI and was determined it was a "mini stroke" with no residual except some tingling in right foot knee top of hand  . Urgency of urination    No past surgical history pertinent negatives on file. Scheduled Meds:  Current Outpatient Medications on File Prior to Visit  Medication Sig Dispense Refill  . amiodarone (PACERONE) 200 MG tablet Take 1 tablet (200 mg total) by mouth 2 (two) times daily. 60 tablet 1  . aspirin EC 325 MG EC tablet Take 1 tablet (325 mg total) by mouth daily. 30 tablet 0  . atorvastatin (LIPITOR) 80 MG tablet Take 80 mg by mouth daily.    . fluticasone (FLONASE) 50 MCG/ACT nasal spray Place 1 spray into both nostrils daily.    Marland Kitchen loratadine (CLARITIN) 10 MG tablet Take 10 mg by mouth daily as needed for allergies.    . metFORMIN (GLUCOPHAGE) 500 MG tablet Take 500 mg by mouth 2 (two) times daily.    . metoprolol tartrate (LOPRESSOR) 25 MG tablet Take 0.5 tablets (12.5 mg  total) by mouth 2 (two) times daily. 30 tablet 1  . oxyCODONE (OXY IR/ROXICODONE) 5 MG immediate release tablet Take 1 tablet (5 mg total) by mouth every 6 (six) hours as needed for severe pain. 30 tablet 0   No current facility-administered medications on file prior to visit.      Allergies  Allergen Reactions  . Percocet [Oxycodone-Acetaminophen] Nausea And Vomiting  . Percocet [Oxycodone-Acetaminophen] Nausea And Vomiting  . Vicodin [Hydrocodone-Acetaminophen] Nausea And Vomiting  . Vicodin [Hydrocodone-Acetaminophen] Nausea And Vomiting   Blood pressure (!) 142/82, pulse 65, resp. rate 18, height 5' 7.75" (1.721 m), weight 79.7 kg, SpO2 98 %.  Subjective  Daniel Holland is a 68 year old male patient who underwent coronary bypass grafting x4 with Dr. Prescott Gum on 07/08/2018.  He reports today for his 4-week follow-up after being in [place] for a few weeks recovering.  Since he was in [place] he missed his cardiology follow-up appointment.  I have reached out to Gay Filler to schedule him a follow-up appointment with a [facility] cardiologist.  Objective  Cor: RRR, no murmur Pulm: CTA in all fields Abd:no tenderness Ext:no edema  Wound: Sternal incision is clean/dry/and intact.  Right EVH site is healing well.  He does have a small bump presumably in the tunnel where the vein was harvested near his groin.  It has been decreasing in size since surgery.  CXR results  CLINICAL DATA:  Status post recent coronary artery bypass grafting. Hypertension.  EXAM: CHEST - 2 VIEW  COMPARISON:  July 13 2018  FINDINGS: There is atelectatic change in the right mid lung region. There is also slight left base atelectasis with minimal left pleural effusion. Lungs elsewhere are clear. No pneumothorax. Heart size and pulmonary vascularity are normal. Status post coronary artery bypass grafting. No adenopathy. No bone lesions.  IMPRESSION: Areas of relatively mild atelectatic change  bilaterally. Small left pleural effusion. No edema or consolidation. Heart size within normal limits. No evident adenopathy.   Electronically Signed   By: Lowella Grip III M.D.   On: 08/04/2018 13:13  Assessment & Plan   Daniel Holland is a 68 year old male patient s/p CABG x 4 with Dr. Prescott Gum. He is here for his 4 week follow-up appointment.  Overall, he is doing quite well.  He does endorse some pins-and-needles over his left chest which I explained was the harvest site of the internal mammary artery.  The symptoms should go away over time.  He also had some numbness/tingling down his right arm into his fingers.  This is most likely from the stretching and positioning of his arms tucked under his body during surgery.  The symptoms are currently improving as well.  They should improve with time, but if they do not he needs to reach out to our office or his primary care provider.  Symptoms seem to be positional in nature.  He had some questions today about starting allergy medicine specifically Xyzol as needed for allergy symptoms.  As long as the allergy medicine is strictly an antihistamine and not a decongestant it is safe to use.  We went over the risk factors for decongestants and heart rhythm.  Since he has a history of atrial fibrillation I suggested that he stay away from decongestants for the moment.  He is not had any further issues with arrhythmias as far as he knows.  Today he was in normal sinus rhythm and I dropped his amiodarone from 200 mg twice daily to 200 mg daily.  We discussed the importance of a follow-up appointment with cardiology so they may monitor his care over time.  I referred him to our cardiac and pulmonary rehab program here at [facility] and he was interested in attending.  He does have some paperwork sent by the state of [place] with him today involving reinstating his driving license.  It requires a physician signature and clearance.  I will make sure that Dr.  Darcey Nora receives his paperwork and it is filled out appropriately.  In the meantime, he is not to drive a motor vehicle.  He has no longer taking narcotic pain medication and is only taking Tylenol for pain relief.  He has not had any further syncopal episodes or shortness of breath since surgery.  He has been increasing his activity level over the last couple weeks and I gave him some guidelines today for increasing it further.  All the patient's questions were answered today to his satisfaction.  I do not feel there needs to be another scheduled follow-up visit, but he is always welcome to call our office if conditions change or need further attention.  Medication changes: Please take amiodarone 200 mg once a day. Follow-up: Please follow-up with your cardiologist.  No routine follow-up needed with our office. Cardiac rehab referral placed today. Chest tube stitches removed during this visit.  Elgie Collard 08/04/2018

## 2018-08-04 NOTE — Patient Instructions (Signed)
You are encouraged to enroll and participate in the outpatient cardiac rehab program beginning as soon as practical.  Make every effort to stay physically active, get some type of exercise on a regular basis, and stick to a "heart healthy diet".  The long term benefits for regular exercise and a healthy diet are critically important to your overall health and wellbeing.

## 2018-08-05 NOTE — Telephone Encounter (Signed)
Called pt to see if he wanted to participate in the Cardiac rehab program. Pt declined stated that he has a part time job and the timing is not going to work for him at this time. Closed referral Tedra Senegal. Support Rep II

## 2018-08-06 DIAGNOSIS — R600 Localized edema: Secondary | ICD-10-CM | POA: Diagnosis not present

## 2018-08-06 DIAGNOSIS — E7800 Pure hypercholesterolemia, unspecified: Secondary | ICD-10-CM | POA: Diagnosis not present

## 2018-08-06 DIAGNOSIS — I10 Essential (primary) hypertension: Secondary | ICD-10-CM | POA: Diagnosis not present

## 2018-08-06 DIAGNOSIS — I639 Cerebral infarction, unspecified: Secondary | ICD-10-CM | POA: Diagnosis not present

## 2018-08-06 DIAGNOSIS — I25118 Atherosclerotic heart disease of native coronary artery with other forms of angina pectoris: Secondary | ICD-10-CM | POA: Diagnosis not present

## 2018-08-06 DIAGNOSIS — E119 Type 2 diabetes mellitus without complications: Secondary | ICD-10-CM | POA: Diagnosis not present

## 2018-08-06 DIAGNOSIS — Z09 Encounter for follow-up examination after completed treatment for conditions other than malignant neoplasm: Secondary | ICD-10-CM | POA: Diagnosis not present

## 2018-08-06 DIAGNOSIS — Z951 Presence of aortocoronary bypass graft: Secondary | ICD-10-CM | POA: Diagnosis not present

## 2018-08-06 DIAGNOSIS — J9601 Acute respiratory failure with hypoxia: Secondary | ICD-10-CM | POA: Diagnosis not present

## 2018-08-06 DIAGNOSIS — L821 Other seborrheic keratosis: Secondary | ICD-10-CM | POA: Diagnosis not present

## 2018-08-06 NOTE — Telephone Encounter (Signed)
Pt call back and he was interested in participating in the Cardiac Rehab Program. Patient stated yes. Patient will come in for orientation on 08/25/2018 @ 8:00am and will attend the 6:45am exercise class.  Mailed homework package. Tedra Senegal. Support Rep II

## 2018-08-18 ENCOUNTER — Telehealth (HOSPITAL_COMMUNITY): Payer: Self-pay

## 2018-08-18 NOTE — Telephone Encounter (Signed)
Called and spoke with pt in regards to CR, adv pt our department is closed due to the COVID-19 for the next 4 weeks. And we will contact him once we resume scheduling. Pt verbalized understanding.

## 2018-08-20 ENCOUNTER — Telehealth: Payer: Self-pay | Admitting: Physician Assistant

## 2018-08-20 NOTE — Telephone Encounter (Signed)
   Primary Cardiologist: Shelva Majestic, MD   Pt contacted.  History and symptoms reviewed.  Pt will f/u with [facility] provider as scheduled.  Pt. advised that we are restricting visitors at this time and request that only patients present for check-in prior to their appointment.  All other visitors should remain in their car.  If necessary, only one visitor may come with the patient, into the building. For everyone's safety, all patients and visitor entering our practice area should expect to be screened again prior to entering our waiting area.  [place], [place]  08/20/2018 3:08 PM       Cardiac Questionnaire:    Since your last visit or hospitalization:    1. Have you been having new or worsening chest pain? No   2. Have you been having new or worsening shortness of breath? No 3. Have you been having new or worsening leg swelling, wt gain, or increase in abdominal girth (pants fitting more tightly)? No   4. Have you had any passing out spells? No    *A YES to any of these questions would result in the appointment being kept. *If all the answers to these questions are NO, we should indicate that given the current situation regarding the worldwide coronarvirus pandemic, at the recommendation of the CDC, we are looking to limit gatherings in our waiting area, and thus will reschedule their appointment beyond four weeks from today.   _____________   [id number]-[id number] Pre-Screening Questions:  . Do you currently have a fever? No (yes = cancel and refer to pcp for e-visit) . Have you recently travelled on a cruise, internationally, or to [place], [place], [place], [place], [place], or [place], [place] [employer]) ? No (yes = cancel, stay home, monitor symptoms, and contact pcp or initiate e-visit if symptoms develop) . Have you been in contact with someone that is currently pending confirmation of Covid19 testing or has been confirmed to have the [place] virus?  No (yes = cancel, stay home, away from tested individual, monitor  symptoms, and contact pcp or initiate e-visit if symptoms develop) . Are you currently experiencing fatigue or cough? No (yes = pt should be prepared to have a mask placed at the time of their visit).

## 2018-08-21 ENCOUNTER — Ambulatory Visit: Payer: Medicare HMO | Admitting: Physician Assistant

## 2018-08-21 ENCOUNTER — Encounter: Payer: Self-pay | Admitting: Physician Assistant

## 2018-08-21 ENCOUNTER — Other Ambulatory Visit: Payer: Self-pay

## 2018-08-21 VITALS — BP 150/68 | HR 76 | Ht 67.75 in | Wt 181.8 lb

## 2018-08-21 DIAGNOSIS — D509 Iron deficiency anemia, unspecified: Secondary | ICD-10-CM

## 2018-08-21 DIAGNOSIS — I480 Paroxysmal atrial fibrillation: Secondary | ICD-10-CM | POA: Diagnosis not present

## 2018-08-21 DIAGNOSIS — Z8673 Personal history of transient ischemic attack (TIA), and cerebral infarction without residual deficits: Secondary | ICD-10-CM

## 2018-08-21 DIAGNOSIS — E876 Hypokalemia: Secondary | ICD-10-CM | POA: Diagnosis not present

## 2018-08-21 DIAGNOSIS — I25810 Atherosclerosis of coronary artery bypass graft(s) without angina pectoris: Secondary | ICD-10-CM

## 2018-08-21 DIAGNOSIS — I10 Essential (primary) hypertension: Secondary | ICD-10-CM

## 2018-08-21 DIAGNOSIS — E785 Hyperlipidemia, unspecified: Secondary | ICD-10-CM | POA: Diagnosis not present

## 2018-08-21 DIAGNOSIS — E119 Type 2 diabetes mellitus without complications: Secondary | ICD-10-CM | POA: Diagnosis not present

## 2018-08-21 DIAGNOSIS — I469 Cardiac arrest, cause unspecified: Secondary | ICD-10-CM

## 2018-08-21 MED ORDER — METOPROLOL TARTRATE 25 MG PO TABS: 25.0000 mg | ORAL_TABLET | Freq: Two times a day (BID) | ORAL | 3 refills | Status: DC

## 2018-08-21 NOTE — Patient Instructions (Addendum)
Medication Instructions:  TAKE AMIODARONE 200 MG (1 tablet)  DAILY- finish what you have on hand  INCREASE METOPROLOL TARTRATE TO 25 MG (1 TABLET)  2 TIMES A DAY If you need a refill on your cardiac medications before your next appointment, please call your pharmacy.   Lab work: You will have labs (blood work) drawn today: CBC  BMET  If you have labs (blood work) drawn today and your tests are completely normal, you will receive your results only by: Marland Kitchen MyChart Message (if you have MyChart) OR . A paper copy in the mail If you have any lab test that is abnormal or we need to change your treatment, we will call you to review the results.  Testing/Procedures:   Follow-Up: At [facility], you and your health needs are our priority.  As part of our continuing mission to provide you with exceptional heart care, we have created designated Provider Care Teams.  These Care Teams include your primary Cardiologist (physician) and Advanced Practice Providers (APPs -  Physician Assistants and Nurse Practitioners) who all work together to provide you with the care you need, when you need it. You will need a follow up appointment in 4 months.  Please call our office 2 months in advance to schedule this appointment.  You may see Shelva Majestic, MD or one of the following Advanced Practice Providers on your designated Care Team: [place], [place] . Fabian Sharp, PA-C  Any Other Special Instructions Will Be Listed Below (If Applicable).
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
## 2018-08-22 LAB — CBC
HEMATOCRIT: 41.5 % (ref 37.5–51.0)
HEMOGLOBIN: 13.7 g/dL (ref 13.0–17.7)
MCH: 29.3 pg (ref 26.6–33.0)
MCHC: 33 g/dL (ref 31.5–35.7)
MCV: 89 fL (ref 79–97)
Platelets: 292 10*3/uL (ref 150–450)
RBC: 4.67 x10E6/uL (ref 4.14–5.80)
RDW: 13.6 % (ref 11.6–15.4)
WBC: 6.2 10*3/uL (ref 3.4–10.8)

## 2018-08-22 LAB — BASIC METABOLIC PANEL
BUN/Creatinine Ratio: 14 (ref 10–24)
BUN: 15 mg/dL (ref 8–27)
CO2: 23 mmol/L (ref 20–29)
CREATININE: 1.05 mg/dL (ref 0.76–1.27)
Calcium: 9.4 mg/dL (ref 8.6–10.2)
Chloride: 99 mmol/L (ref 96–106)
GFR calc Af Amer: 83 mL/min/{1.73_m2} (ref >59)
GFR, EST NON AFRICAN AMERICAN: 72 mL/min/{1.73_m2} (ref >59)
Glucose: 192 mg/dL — ABNORMAL HIGH (ref 65–99)
Potassium: 4.5 mmol/L (ref 3.5–5.2)
Sodium: 142 mmol/L (ref 134–144)

## 2018-08-25 ENCOUNTER — Ambulatory Visit (HOSPITAL_COMMUNITY): Payer: Medicare HMO

## 2018-08-25 ENCOUNTER — Telehealth: Payer: Self-pay | Admitting: Physician Assistant

## 2018-08-25 NOTE — Addendum Note (Signed)
Addended by: Isa Rankin D on: 08/25/2018 08:43 AM   Modules accepted: Orders

## 2018-08-25 NOTE — Telephone Encounter (Signed)
Informed  Patient ,  Daniel Holland has not be found, someone looked in pod D and front desk looked in the lost found area.  patient name was given to front desk if jacket  turns up  patient aware.

## 2018-08-25 NOTE — Telephone Encounter (Signed)
New message:   Patient lost a jacket in the office black and something on the left side in white. Please call patient if jacket is found.

## 2018-08-28 ENCOUNTER — Other Ambulatory Visit: Payer: Self-pay | Admitting: Physician Assistant

## 2018-08-28 NOTE — Telephone Encounter (Signed)
New message:   Patient calling concerning going back to work. Please call patient.

## 2018-08-28 NOTE — Progress Notes (Signed)
The patient has been notified of the result and verbalized understanding.  All questions (if any) were answered. Daniel Holland, [place] 08/28/2018 5:40 PM

## 2018-08-28 NOTE — Telephone Encounter (Signed)
Spoke with pt who states he has already gotten his answer about returning to work.

## 2018-08-31 ENCOUNTER — Ambulatory Visit (HOSPITAL_COMMUNITY): Payer: Medicare HMO

## 2018-09-02 ENCOUNTER — Ambulatory Visit (HOSPITAL_COMMUNITY): Payer: Medicare HMO

## 2018-09-04 ENCOUNTER — Ambulatory Visit (HOSPITAL_COMMUNITY): Payer: Medicare HMO

## 2018-09-07 ENCOUNTER — Ambulatory Visit (HOSPITAL_COMMUNITY): Payer: Medicare HMO

## 2018-09-09 ENCOUNTER — Ambulatory Visit (HOSPITAL_COMMUNITY): Payer: Medicare HMO

## 2018-09-10 ENCOUNTER — Other Ambulatory Visit: Payer: Self-pay | Admitting: Physician Assistant

## 2018-09-10 NOTE — Telephone Encounter (Signed)
Lopressor refilled. 

## 2018-09-11 ENCOUNTER — Ambulatory Visit (HOSPITAL_COMMUNITY): Payer: Medicare HMO

## 2018-09-14 ENCOUNTER — Ambulatory Visit (HOSPITAL_COMMUNITY): Payer: Medicare HMO

## 2018-09-16 ENCOUNTER — Ambulatory Visit (HOSPITAL_COMMUNITY): Payer: Medicare HMO

## 2018-09-18 ENCOUNTER — Ambulatory Visit (HOSPITAL_COMMUNITY): Payer: Medicare HMO

## 2018-09-21 ENCOUNTER — Ambulatory Visit (HOSPITAL_COMMUNITY): Payer: Medicare HMO

## 2018-09-23 ENCOUNTER — Ambulatory Visit (HOSPITAL_COMMUNITY): Payer: Medicare HMO

## 2018-09-23 ENCOUNTER — Telehealth (HOSPITAL_COMMUNITY): Payer: Self-pay | Admitting: *Deleted

## 2018-09-23 NOTE — Telephone Encounter (Signed)
Called and spoke to pt regardin  Cardiac Rehab continued closure due to adherence of national recommendation in group settings for Covid 19. Pt verbalized understanding.   Pt averages 2-4 miles a day of walking.  Pt with no complaints.  Pt remains interested in participating CR to understand his limitations and boundaries. Pt would like to get back to the gym(once permitted) along with CR. Cherre Huger, BSN Cardiac and Training and development officer

## 2018-09-25 ENCOUNTER — Ambulatory Visit (HOSPITAL_COMMUNITY): Payer: Medicare HMO

## 2018-09-28 ENCOUNTER — Ambulatory Visit (HOSPITAL_COMMUNITY): Payer: Medicare HMO

## 2018-09-30 ENCOUNTER — Ambulatory Visit (HOSPITAL_COMMUNITY): Payer: Medicare HMO

## 2018-10-02 ENCOUNTER — Ambulatory Visit (HOSPITAL_COMMUNITY): Payer: Medicare HMO

## 2018-10-05 ENCOUNTER — Ambulatory Visit (HOSPITAL_COMMUNITY): Payer: Medicare HMO

## 2018-10-07 ENCOUNTER — Ambulatory Visit (HOSPITAL_COMMUNITY): Payer: Medicare HMO

## 2018-10-09 ENCOUNTER — Ambulatory Visit (HOSPITAL_COMMUNITY): Payer: Medicare HMO

## 2018-10-12 ENCOUNTER — Ambulatory Visit (HOSPITAL_COMMUNITY): Payer: Medicare HMO

## 2018-10-14 ENCOUNTER — Ambulatory Visit (HOSPITAL_COMMUNITY): Payer: Medicare HMO

## 2018-10-16 ENCOUNTER — Ambulatory Visit (HOSPITAL_COMMUNITY): Payer: Medicare HMO

## 2018-10-21 ENCOUNTER — Ambulatory Visit (HOSPITAL_COMMUNITY): Payer: Medicare HMO

## 2018-10-23 ENCOUNTER — Ambulatory Visit (HOSPITAL_COMMUNITY): Payer: Medicare HMO

## 2018-10-26 ENCOUNTER — Ambulatory Visit (HOSPITAL_COMMUNITY): Payer: Medicare HMO

## 2018-10-28 ENCOUNTER — Ambulatory Visit (HOSPITAL_COMMUNITY): Payer: Medicare HMO

## 2018-10-30 ENCOUNTER — Ambulatory Visit (HOSPITAL_COMMUNITY): Payer: Medicare HMO

## 2018-11-02 ENCOUNTER — Telehealth (HOSPITAL_COMMUNITY): Payer: Self-pay | Admitting: *Deleted

## 2018-11-02 ENCOUNTER — Ambulatory Visit (HOSPITAL_COMMUNITY): Payer: Medicare HMO

## 2018-11-02 NOTE — Telephone Encounter (Signed)
Spoke with Daniel Holland. Patient is not interested in either virtual or phase 2 outpatient cardiac rehab at this time. Daniel Heather says he is walking independently on his own.Barnet Pall, RN,BSN 11/02/2018 2:34 PM

## 2018-11-04 ENCOUNTER — Ambulatory Visit (HOSPITAL_COMMUNITY): Payer: Medicare HMO

## 2018-11-06 ENCOUNTER — Ambulatory Visit (HOSPITAL_COMMUNITY): Payer: Medicare HMO

## 2018-11-09 ENCOUNTER — Ambulatory Visit (HOSPITAL_COMMUNITY): Payer: Medicare HMO

## 2018-11-11 ENCOUNTER — Ambulatory Visit (HOSPITAL_COMMUNITY): Payer: Medicare HMO

## 2018-11-13 ENCOUNTER — Ambulatory Visit (HOSPITAL_COMMUNITY): Payer: Medicare HMO

## 2018-11-16 ENCOUNTER — Ambulatory Visit (HOSPITAL_COMMUNITY): Payer: Medicare HMO

## 2018-11-18 ENCOUNTER — Ambulatory Visit (HOSPITAL_COMMUNITY): Payer: Medicare HMO

## 2018-11-20 ENCOUNTER — Ambulatory Visit (HOSPITAL_COMMUNITY): Payer: Medicare HMO

## 2018-11-23 ENCOUNTER — Ambulatory Visit (HOSPITAL_COMMUNITY): Payer: Medicare HMO

## 2018-11-25 ENCOUNTER — Ambulatory Visit (HOSPITAL_COMMUNITY): Payer: Medicare HMO

## 2018-11-30 ENCOUNTER — Ambulatory Visit (HOSPITAL_COMMUNITY): Payer: Medicare HMO

## 2018-12-02 ENCOUNTER — Ambulatory Visit (HOSPITAL_COMMUNITY): Payer: Medicare HMO

## 2018-12-07 ENCOUNTER — Ambulatory Visit: Payer: Medicare HMO | Admitting: Cardiovascular Disease

## 2018-12-15 ENCOUNTER — Other Ambulatory Visit: Payer: Self-pay | Admitting: Physician Assistant

## 2019-02-03 DIAGNOSIS — R69 Illness, unspecified: Secondary | ICD-10-CM | POA: Diagnosis not present

## 2019-02-11 DIAGNOSIS — M25551 Pain in right hip: Secondary | ICD-10-CM | POA: Diagnosis not present

## 2019-02-11 DIAGNOSIS — M5137 Other intervertebral disc degeneration, lumbosacral region: Secondary | ICD-10-CM | POA: Diagnosis not present

## 2019-02-11 DIAGNOSIS — M9905 Segmental and somatic dysfunction of pelvic region: Secondary | ICD-10-CM | POA: Diagnosis not present

## 2019-02-11 DIAGNOSIS — M9903 Segmental and somatic dysfunction of lumbar region: Secondary | ICD-10-CM | POA: Diagnosis not present

## 2019-02-15 DIAGNOSIS — M9905 Segmental and somatic dysfunction of pelvic region: Secondary | ICD-10-CM | POA: Diagnosis not present

## 2019-02-15 DIAGNOSIS — M9903 Segmental and somatic dysfunction of lumbar region: Secondary | ICD-10-CM | POA: Diagnosis not present

## 2019-02-15 DIAGNOSIS — M25551 Pain in right hip: Secondary | ICD-10-CM | POA: Diagnosis not present

## 2019-02-15 DIAGNOSIS — M5137 Other intervertebral disc degeneration, lumbosacral region: Secondary | ICD-10-CM | POA: Diagnosis not present

## 2019-02-17 DIAGNOSIS — M9905 Segmental and somatic dysfunction of pelvic region: Secondary | ICD-10-CM | POA: Diagnosis not present

## 2019-02-17 DIAGNOSIS — M9903 Segmental and somatic dysfunction of lumbar region: Secondary | ICD-10-CM | POA: Diagnosis not present

## 2019-02-17 DIAGNOSIS — M25551 Pain in right hip: Secondary | ICD-10-CM | POA: Diagnosis not present

## 2019-02-17 DIAGNOSIS — M5137 Other intervertebral disc degeneration, lumbosacral region: Secondary | ICD-10-CM | POA: Diagnosis not present

## 2019-02-23 DIAGNOSIS — M9905 Segmental and somatic dysfunction of pelvic region: Secondary | ICD-10-CM | POA: Diagnosis not present

## 2019-02-23 DIAGNOSIS — M9903 Segmental and somatic dysfunction of lumbar region: Secondary | ICD-10-CM | POA: Diagnosis not present

## 2019-02-23 DIAGNOSIS — M25551 Pain in right hip: Secondary | ICD-10-CM | POA: Diagnosis not present

## 2019-02-23 DIAGNOSIS — M5137 Other intervertebral disc degeneration, lumbosacral region: Secondary | ICD-10-CM | POA: Diagnosis not present

## 2019-03-03 DIAGNOSIS — M25551 Pain in right hip: Secondary | ICD-10-CM | POA: Diagnosis not present

## 2019-03-03 DIAGNOSIS — M9903 Segmental and somatic dysfunction of lumbar region: Secondary | ICD-10-CM | POA: Diagnosis not present

## 2019-03-03 DIAGNOSIS — M5137 Other intervertebral disc degeneration, lumbosacral region: Secondary | ICD-10-CM | POA: Diagnosis not present

## 2019-03-03 DIAGNOSIS — M9905 Segmental and somatic dysfunction of pelvic region: Secondary | ICD-10-CM | POA: Diagnosis not present

## 2019-03-09 DIAGNOSIS — M5137 Other intervertebral disc degeneration, lumbosacral region: Secondary | ICD-10-CM | POA: Diagnosis not present

## 2019-03-09 DIAGNOSIS — M25551 Pain in right hip: Secondary | ICD-10-CM | POA: Diagnosis not present

## 2019-03-09 DIAGNOSIS — M9903 Segmental and somatic dysfunction of lumbar region: Secondary | ICD-10-CM | POA: Diagnosis not present

## 2019-03-09 DIAGNOSIS — M9905 Segmental and somatic dysfunction of pelvic region: Secondary | ICD-10-CM | POA: Diagnosis not present

## 2019-03-11 ENCOUNTER — Other Ambulatory Visit: Payer: Self-pay | Admitting: Physician Assistant

## 2019-03-16 DIAGNOSIS — M5137 Other intervertebral disc degeneration, lumbosacral region: Secondary | ICD-10-CM | POA: Diagnosis not present

## 2019-03-16 DIAGNOSIS — M9905 Segmental and somatic dysfunction of pelvic region: Secondary | ICD-10-CM | POA: Diagnosis not present

## 2019-03-16 DIAGNOSIS — M25551 Pain in right hip: Secondary | ICD-10-CM | POA: Diagnosis not present

## 2019-03-16 DIAGNOSIS — M9903 Segmental and somatic dysfunction of lumbar region: Secondary | ICD-10-CM | POA: Diagnosis not present

## 2019-03-21 DIAGNOSIS — R69 Illness, unspecified: Secondary | ICD-10-CM | POA: Diagnosis not present

## 2019-04-06 DIAGNOSIS — I25118 Atherosclerotic heart disease of native coronary artery with other forms of angina pectoris: Secondary | ICD-10-CM | POA: Diagnosis not present

## 2019-04-06 DIAGNOSIS — E559 Vitamin D deficiency, unspecified: Secondary | ICD-10-CM | POA: Diagnosis not present

## 2019-04-06 DIAGNOSIS — I10 Essential (primary) hypertension: Secondary | ICD-10-CM | POA: Diagnosis not present

## 2019-04-06 DIAGNOSIS — E119 Type 2 diabetes mellitus without complications: Secondary | ICD-10-CM | POA: Diagnosis not present

## 2019-04-06 DIAGNOSIS — E7800 Pure hypercholesterolemia, unspecified: Secondary | ICD-10-CM | POA: Diagnosis not present

## 2019-04-06 DIAGNOSIS — I639 Cerebral infarction, unspecified: Secondary | ICD-10-CM | POA: Diagnosis not present

## 2019-04-13 ENCOUNTER — Telehealth: Payer: Self-pay

## 2019-04-13 NOTE — Telephone Encounter (Signed)
LM2CB-Pt dropped off letter from [facility] that mentions to fill out form but there is no form to fill out.

## 2019-04-16 NOTE — Telephone Encounter (Signed)
LM2CB 

## 2019-04-19 NOTE — Telephone Encounter (Signed)
LM2CB-Pt dropped off DMV letter. Need to discuss this with pt

## 2019-04-19 NOTE — Telephone Encounter (Signed)
Pt call back medical records and states that he no longer needs this filled out, he already took care of this a different way. Please shred form

## 2019-04-29 NOTE — Telephone Encounter (Signed)
Documents shredded

## 2019-10-28 DIAGNOSIS — R69 Illness, unspecified: Secondary | ICD-10-CM | POA: Diagnosis not present

## 2019-11-02 IMAGING — DX CHEST - 2 VIEW
2 series · 2 of 2 positions shown · non-contrast
Comparison: July 13, 2018

CLINICAL DATA: Status post recent coronary artery bypass grafting.
Hypertension.

EXAM:
CHEST - 2 VIEW

[dg chest 2 view (1 of 2)]
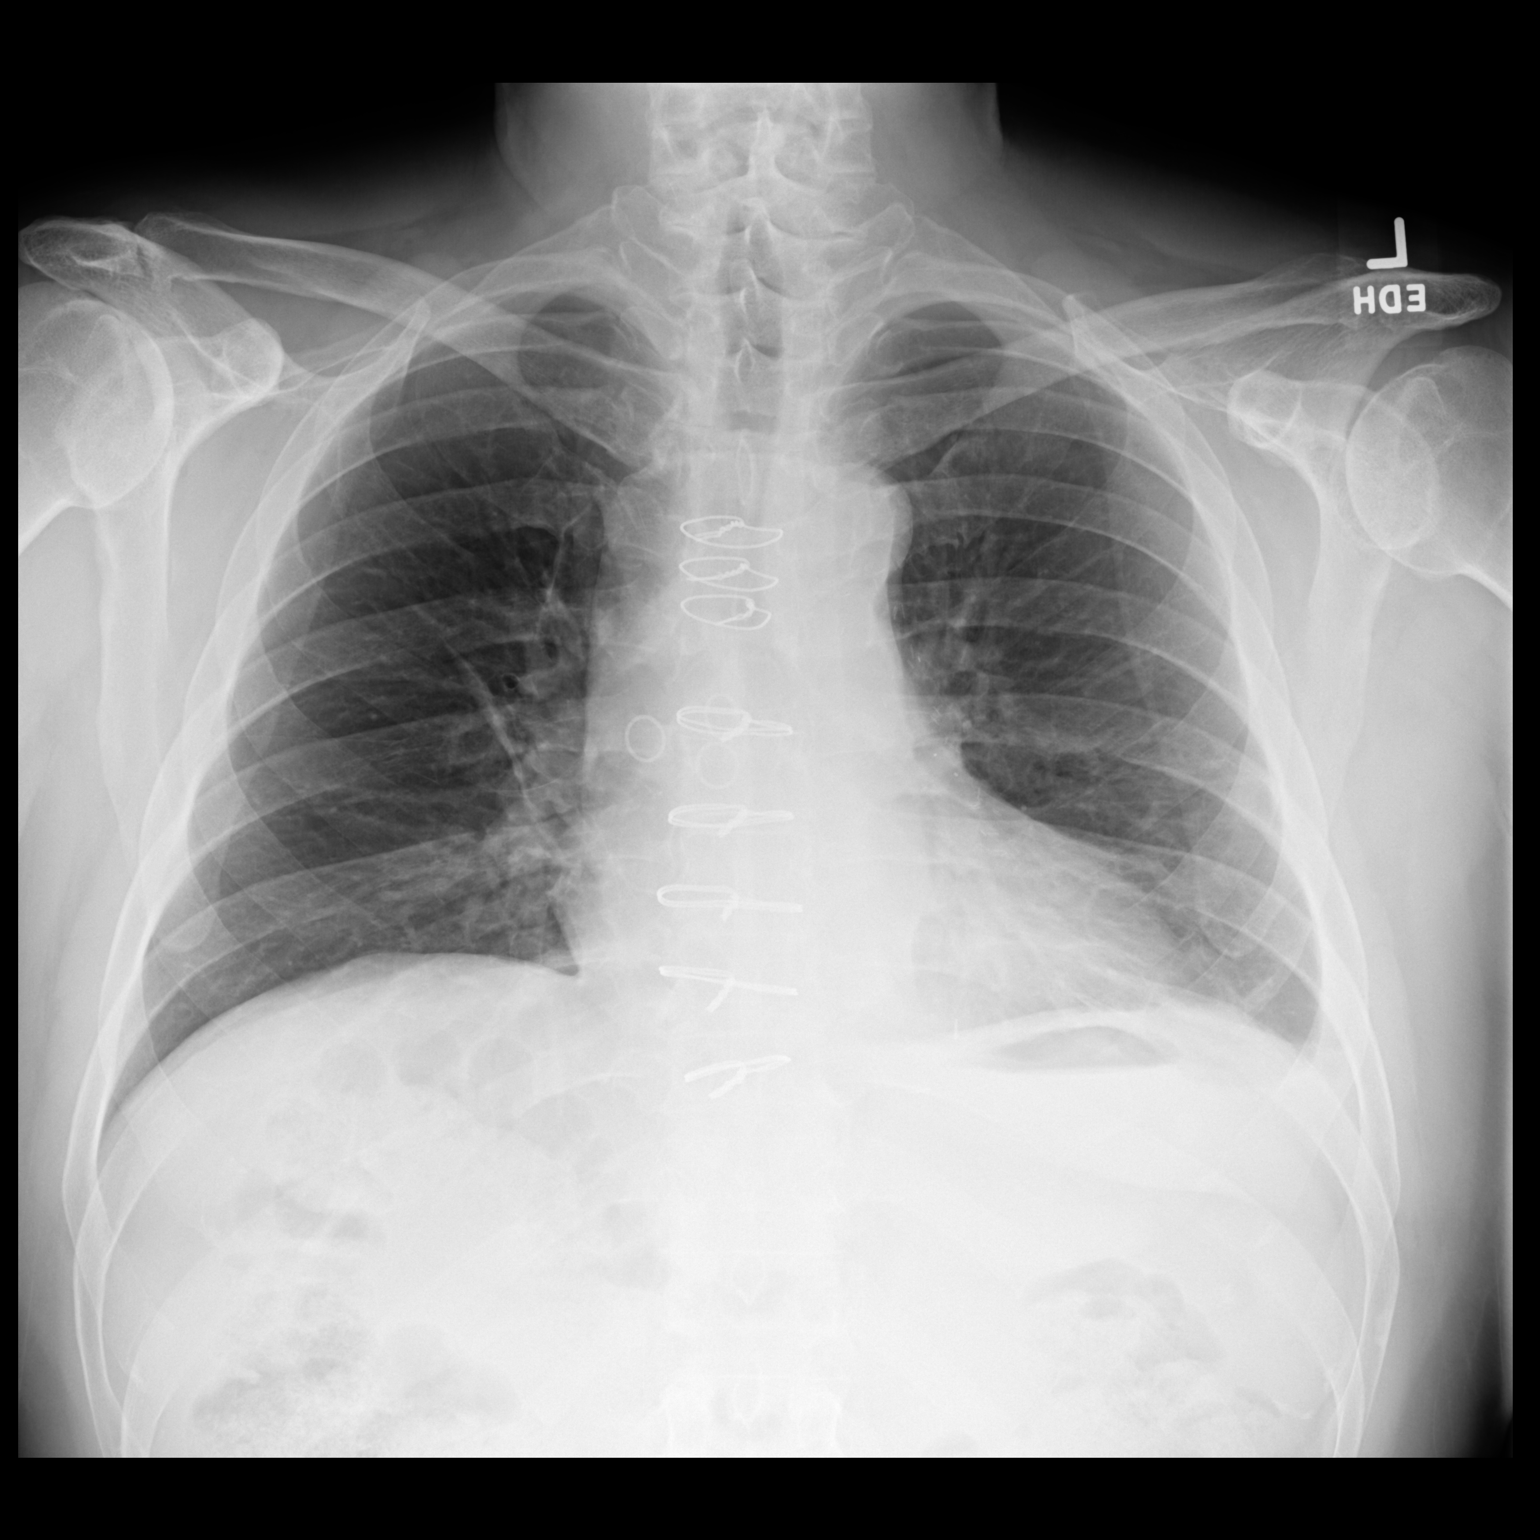

[dg chest 2 view (2 of 2)]
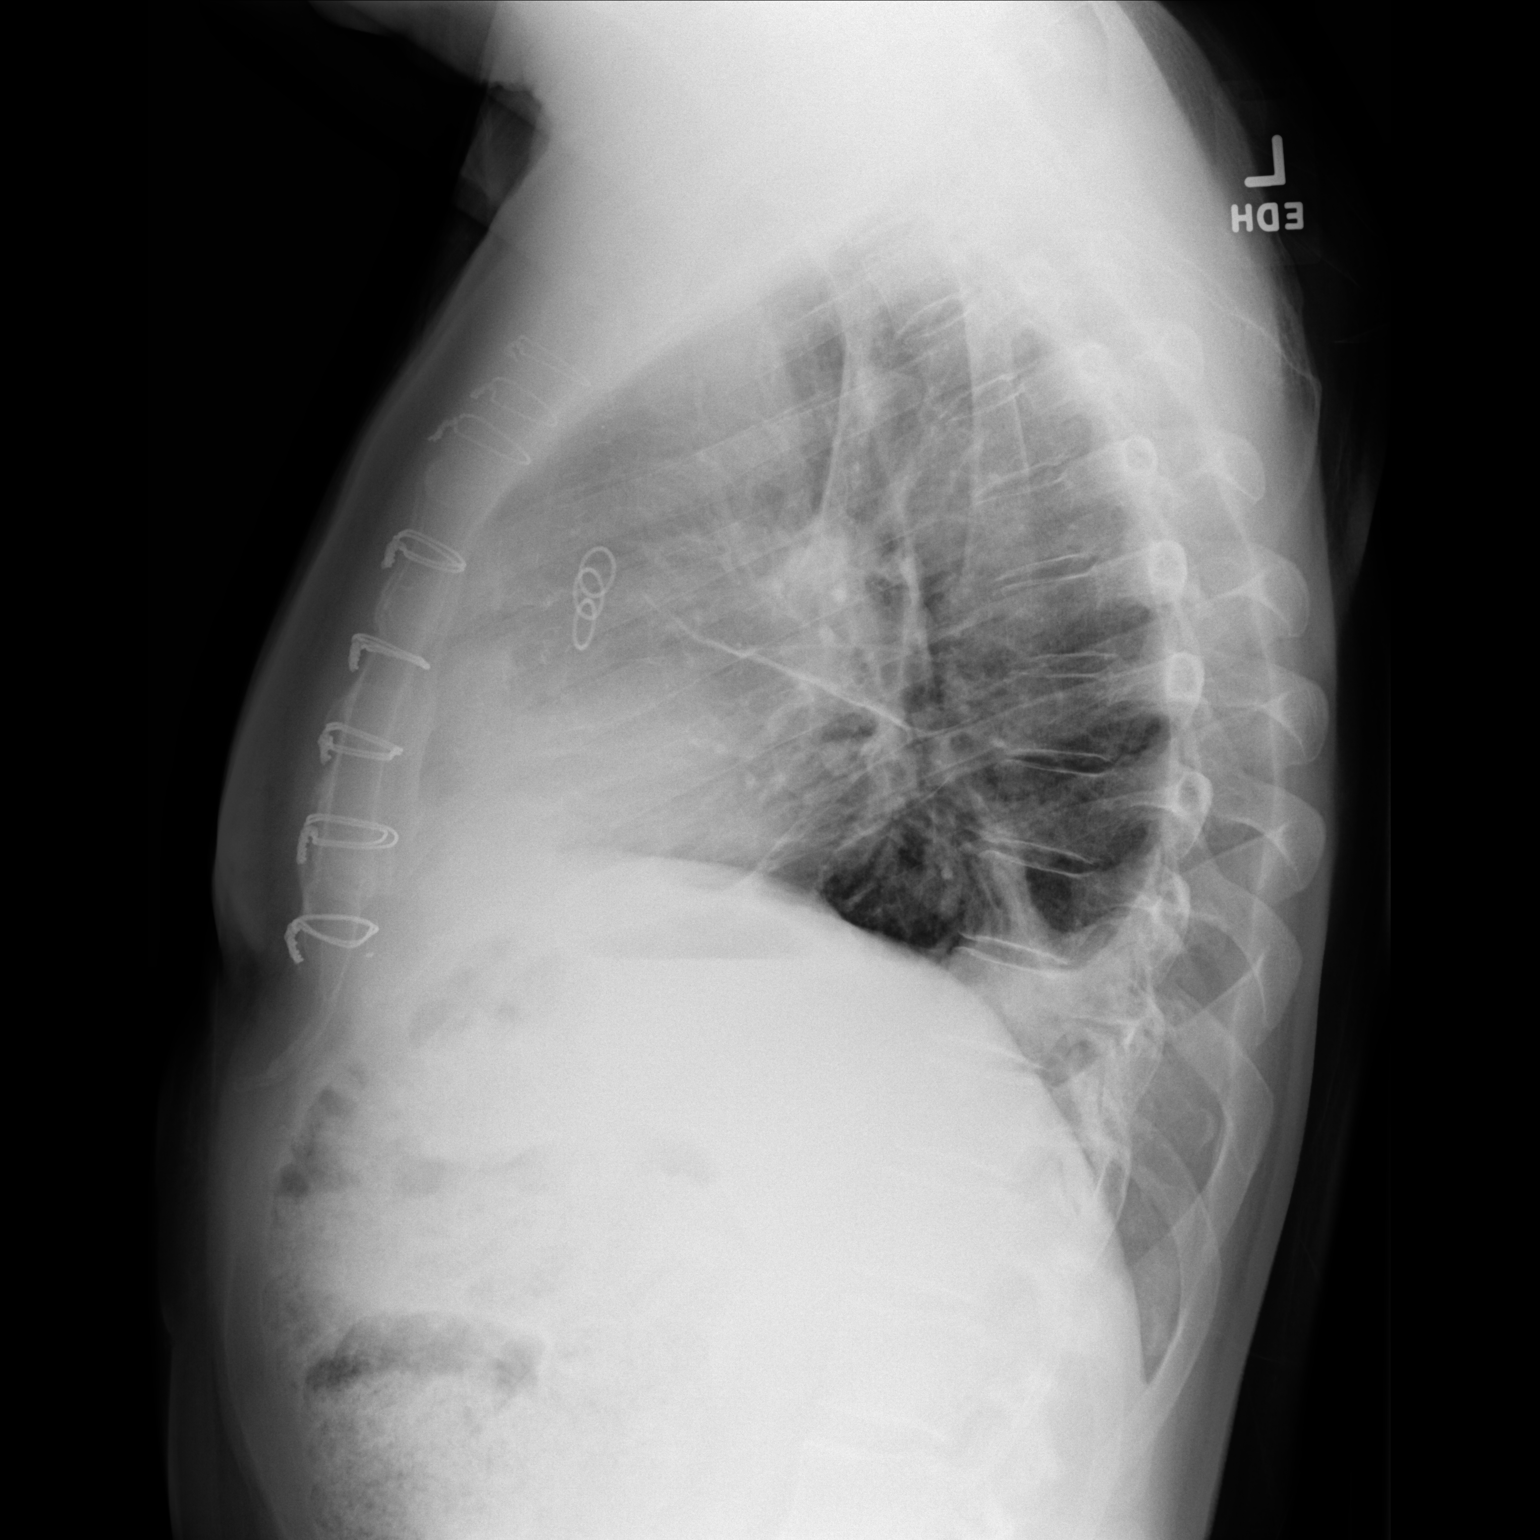

[2 of 2 positions shown; findings below may reference images not displayed]

FINDINGS: There is atelectatic change in the right mid lung region. There is
also slight left base atelectasis with minimal left pleural
effusion. Lungs elsewhere are clear. No pneumothorax. Heart size and
pulmonary vascularity are normal. Status post coronary artery bypass
grafting. No adenopathy. No bone lesions.
IMPRESSION: Areas of relatively mild atelectatic change bilaterally. Small left
pleural effusion. No edema or consolidation. Heart size within
normal limits. No evident adenopathy.

## 2019-11-11 DIAGNOSIS — E119 Type 2 diabetes mellitus without complications: Secondary | ICD-10-CM | POA: Diagnosis not present

## 2019-11-11 DIAGNOSIS — I25118 Atherosclerotic heart disease of native coronary artery with other forms of angina pectoris: Secondary | ICD-10-CM | POA: Diagnosis not present

## 2019-11-11 DIAGNOSIS — Z8673 Personal history of transient ischemic attack (TIA), and cerebral infarction without residual deficits: Secondary | ICD-10-CM | POA: Diagnosis not present

## 2019-11-11 DIAGNOSIS — E559 Vitamin D deficiency, unspecified: Secondary | ICD-10-CM | POA: Diagnosis not present

## 2019-11-11 DIAGNOSIS — R69 Illness, unspecified: Secondary | ICD-10-CM | POA: Diagnosis not present

## 2019-11-11 DIAGNOSIS — E1159 Type 2 diabetes mellitus with other circulatory complications: Secondary | ICD-10-CM | POA: Diagnosis not present

## 2019-11-11 DIAGNOSIS — I10 Essential (primary) hypertension: Secondary | ICD-10-CM | POA: Diagnosis not present

## 2019-11-11 DIAGNOSIS — E7800 Pure hypercholesterolemia, unspecified: Secondary | ICD-10-CM | POA: Diagnosis not present

## 2019-11-11 DIAGNOSIS — Z7984 Long term (current) use of oral hypoglycemic drugs: Secondary | ICD-10-CM | POA: Diagnosis not present

## 2019-12-22 DIAGNOSIS — M5116 Intervertebral disc disorders with radiculopathy, lumbar region: Secondary | ICD-10-CM | POA: Diagnosis not present

## 2019-12-22 DIAGNOSIS — M9905 Segmental and somatic dysfunction of pelvic region: Secondary | ICD-10-CM | POA: Diagnosis not present

## 2019-12-22 DIAGNOSIS — M9903 Segmental and somatic dysfunction of lumbar region: Secondary | ICD-10-CM | POA: Diagnosis not present

## 2019-12-22 DIAGNOSIS — M545 Low back pain: Secondary | ICD-10-CM | POA: Diagnosis not present

## 2019-12-27 DIAGNOSIS — M9905 Segmental and somatic dysfunction of pelvic region: Secondary | ICD-10-CM | POA: Diagnosis not present

## 2019-12-27 DIAGNOSIS — M5116 Intervertebral disc disorders with radiculopathy, lumbar region: Secondary | ICD-10-CM | POA: Diagnosis not present

## 2019-12-27 DIAGNOSIS — M545 Low back pain: Secondary | ICD-10-CM | POA: Diagnosis not present

## 2019-12-27 DIAGNOSIS — M9903 Segmental and somatic dysfunction of lumbar region: Secondary | ICD-10-CM | POA: Diagnosis not present

## 2019-12-28 DIAGNOSIS — M47816 Spondylosis without myelopathy or radiculopathy, lumbar region: Secondary | ICD-10-CM | POA: Diagnosis not present

## 2019-12-29 DIAGNOSIS — M9903 Segmental and somatic dysfunction of lumbar region: Secondary | ICD-10-CM | POA: Diagnosis not present

## 2019-12-29 DIAGNOSIS — M5116 Intervertebral disc disorders with radiculopathy, lumbar region: Secondary | ICD-10-CM | POA: Diagnosis not present

## 2019-12-29 DIAGNOSIS — M9905 Segmental and somatic dysfunction of pelvic region: Secondary | ICD-10-CM | POA: Diagnosis not present

## 2019-12-29 DIAGNOSIS — M545 Low back pain: Secondary | ICD-10-CM | POA: Diagnosis not present

## 2020-01-03 DIAGNOSIS — M545 Low back pain: Secondary | ICD-10-CM | POA: Diagnosis not present

## 2020-01-03 DIAGNOSIS — M5116 Intervertebral disc disorders with radiculopathy, lumbar region: Secondary | ICD-10-CM | POA: Diagnosis not present

## 2020-01-03 DIAGNOSIS — M9903 Segmental and somatic dysfunction of lumbar region: Secondary | ICD-10-CM | POA: Diagnosis not present

## 2020-01-03 DIAGNOSIS — M9905 Segmental and somatic dysfunction of pelvic region: Secondary | ICD-10-CM | POA: Diagnosis not present

## 2020-01-06 DIAGNOSIS — M5116 Intervertebral disc disorders with radiculopathy, lumbar region: Secondary | ICD-10-CM | POA: Diagnosis not present

## 2020-01-06 DIAGNOSIS — M9903 Segmental and somatic dysfunction of lumbar region: Secondary | ICD-10-CM | POA: Diagnosis not present

## 2020-01-06 DIAGNOSIS — M545 Low back pain: Secondary | ICD-10-CM | POA: Diagnosis not present

## 2020-01-06 DIAGNOSIS — M9905 Segmental and somatic dysfunction of pelvic region: Secondary | ICD-10-CM | POA: Diagnosis not present

## 2020-01-17 DIAGNOSIS — M545 Low back pain: Secondary | ICD-10-CM | POA: Diagnosis not present

## 2020-01-17 DIAGNOSIS — M9903 Segmental and somatic dysfunction of lumbar region: Secondary | ICD-10-CM | POA: Diagnosis not present

## 2020-01-17 DIAGNOSIS — M9905 Segmental and somatic dysfunction of pelvic region: Secondary | ICD-10-CM | POA: Diagnosis not present

## 2020-01-17 DIAGNOSIS — M5116 Intervertebral disc disorders with radiculopathy, lumbar region: Secondary | ICD-10-CM | POA: Diagnosis not present

## 2020-01-19 DIAGNOSIS — M9905 Segmental and somatic dysfunction of pelvic region: Secondary | ICD-10-CM | POA: Diagnosis not present

## 2020-01-19 DIAGNOSIS — M545 Low back pain: Secondary | ICD-10-CM | POA: Diagnosis not present

## 2020-01-19 DIAGNOSIS — M9903 Segmental and somatic dysfunction of lumbar region: Secondary | ICD-10-CM | POA: Diagnosis not present

## 2020-01-19 DIAGNOSIS — M5116 Intervertebral disc disorders with radiculopathy, lumbar region: Secondary | ICD-10-CM | POA: Diagnosis not present

## 2020-01-25 DIAGNOSIS — M5116 Intervertebral disc disorders with radiculopathy, lumbar region: Secondary | ICD-10-CM | POA: Diagnosis not present

## 2020-01-25 DIAGNOSIS — M9905 Segmental and somatic dysfunction of pelvic region: Secondary | ICD-10-CM | POA: Diagnosis not present

## 2020-01-25 DIAGNOSIS — M545 Low back pain: Secondary | ICD-10-CM | POA: Diagnosis not present

## 2020-01-25 DIAGNOSIS — M9903 Segmental and somatic dysfunction of lumbar region: Secondary | ICD-10-CM | POA: Diagnosis not present

## 2020-02-03 ENCOUNTER — Ambulatory Visit: Payer: Medicare HMO | Admitting: Family Medicine

## 2020-03-15 ENCOUNTER — Other Ambulatory Visit: Payer: Self-pay | Admitting: Physician Assistant

## 2020-03-28 DIAGNOSIS — G5761 Lesion of plantar nerve, right lower limb: Secondary | ICD-10-CM | POA: Diagnosis not present

## 2020-03-28 DIAGNOSIS — M79671 Pain in right foot: Secondary | ICD-10-CM | POA: Diagnosis not present

## 2020-04-10 DIAGNOSIS — R69 Illness, unspecified: Secondary | ICD-10-CM | POA: Diagnosis not present

## 2020-04-21 DIAGNOSIS — Z0100 Encounter for examination of eyes and vision without abnormal findings: Secondary | ICD-10-CM | POA: Diagnosis not present

## 2020-05-10 DIAGNOSIS — R69 Illness, unspecified: Secondary | ICD-10-CM | POA: Diagnosis not present

## 2020-05-17 ENCOUNTER — Telehealth: Payer: Self-pay

## 2020-05-17 NOTE — Telephone Encounter (Signed)
Received documentation from pt from [facility]. Called to advise pt that he is several months overdue for follow up since he was last seen on 08/21/2018 by Almyra Deforest, PA-C. He was advised at that appointment to follow up in 4 months but has not been seen since that time (and has not been seen by Dr. Claiborne Billings at all in the office). Pt states he needs his paperwork filled out much sooner than that, so he would like to come back by the office to pick it up and have a different provider fill it out. Advised pt to go ahead and make a follow up visit with Dr. Claiborne Billings to maintain his status as a patient at this practice. Appointment made with Dr. Claiborne Billings 07/17/20 at 2 pm. Pt verbalizes understanding. DMV paperwork placed back in original envelope and left at front desk for patient.

## 2020-05-27 DIAGNOSIS — E118 Type 2 diabetes mellitus with unspecified complications: Secondary | ICD-10-CM

## 2020-05-27 HISTORY — DX: Type 2 diabetes mellitus with unspecified complications: E11.8

## 2020-06-01 DIAGNOSIS — Z8673 Personal history of transient ischemic attack (TIA), and cerebral infarction without residual deficits: Secondary | ICD-10-CM | POA: Diagnosis not present

## 2020-06-01 DIAGNOSIS — E1159 Type 2 diabetes mellitus with other circulatory complications: Secondary | ICD-10-CM | POA: Diagnosis not present

## 2020-06-01 DIAGNOSIS — I25118 Atherosclerotic heart disease of native coronary artery with other forms of angina pectoris: Secondary | ICD-10-CM | POA: Diagnosis not present

## 2020-06-01 DIAGNOSIS — E7800 Pure hypercholesterolemia, unspecified: Secondary | ICD-10-CM | POA: Diagnosis not present

## 2020-06-01 DIAGNOSIS — I10 Essential (primary) hypertension: Secondary | ICD-10-CM | POA: Diagnosis not present

## 2020-06-01 DIAGNOSIS — Z23 Encounter for immunization: Secondary | ICD-10-CM | POA: Diagnosis not present

## 2020-06-01 DIAGNOSIS — E559 Vitamin D deficiency, unspecified: Secondary | ICD-10-CM | POA: Diagnosis not present

## 2020-06-11 ENCOUNTER — Other Ambulatory Visit: Payer: Self-pay | Admitting: Physician Assistant

## 2020-07-14 DIAGNOSIS — E559 Vitamin D deficiency, unspecified: Secondary | ICD-10-CM | POA: Diagnosis not present

## 2020-07-14 DIAGNOSIS — I25118 Atherosclerotic heart disease of native coronary artery with other forms of angina pectoris: Secondary | ICD-10-CM | POA: Diagnosis not present

## 2020-07-14 DIAGNOSIS — I10 Essential (primary) hypertension: Secondary | ICD-10-CM | POA: Diagnosis not present

## 2020-07-14 DIAGNOSIS — E7800 Pure hypercholesterolemia, unspecified: Secondary | ICD-10-CM | POA: Diagnosis not present

## 2020-07-14 DIAGNOSIS — Z8673 Personal history of transient ischemic attack (TIA), and cerebral infarction without residual deficits: Secondary | ICD-10-CM | POA: Diagnosis not present

## 2020-07-14 DIAGNOSIS — Z23 Encounter for immunization: Secondary | ICD-10-CM | POA: Diagnosis not present

## 2020-07-17 ENCOUNTER — Ambulatory Visit: Payer: Medicare HMO | Admitting: Cardiovascular Disease

## 2020-07-24 ENCOUNTER — Ambulatory Visit: Payer: Medicare HMO | Admitting: Cardiovascular Disease

## 2020-07-24 ENCOUNTER — Other Ambulatory Visit: Payer: Self-pay

## 2020-07-24 ENCOUNTER — Encounter: Payer: Self-pay | Admitting: Cardiovascular Disease

## 2020-07-24 VITALS — BP 152/84 | HR 61 | Ht 68.0 in | Wt 187.2 lb

## 2020-07-24 DIAGNOSIS — I469 Cardiac arrest, cause unspecified: Secondary | ICD-10-CM

## 2020-07-24 DIAGNOSIS — I25810 Atherosclerosis of coronary artery bypass graft(s) without angina pectoris: Secondary | ICD-10-CM

## 2020-07-24 DIAGNOSIS — E785 Hyperlipidemia, unspecified: Secondary | ICD-10-CM

## 2020-07-24 DIAGNOSIS — I10 Essential (primary) hypertension: Secondary | ICD-10-CM

## 2020-07-24 MED ORDER — AMLODIPINE BESYLATE 5 MG PO TABS: 5.0000 mg | ORAL_TABLET | Freq: Every day | ORAL | 3 refills | Status: DC

## 2020-07-24 NOTE — Patient Instructions (Signed)
Medication Instructions:  Start Amlodipine 5 mg daily   *If you need a refill on your cardiac medications before your next appointment, please call your pharmacy*   Testing/Procedures: Your physician has requested that you have a lexiscan myoview. A cardiac stress test is a cardiological test that measures the heart's ability to respond to external stress in a controlled clinical environment. The stress response is induced by intravenous pharmacological stimulation.   Echocardiogram - Your physician has requested that you have an echocardiogram. Echocardiography is a painless test that uses sound waves to create images of your heart. It provides your doctor with information about the size and shape of your heart and how well your heart's chambers and valves are working. This procedure takes approximately one hour. There are no restrictions for this procedure. This will be performed at our [facility] location - [address], [address].    Follow-Up: At [facility], you and your health needs are our priority.  As part of our continuing mission to provide you with exceptional heart care, we have created designated Provider Care Teams.  These Care Teams include your primary Cardiologist (physician) and Advanced Practice Providers (APPs -  Physician Assistants and Nurse Practitioners) who all work together to provide you with the care you need, when you need it.  We recommend signing up for the patient portal called "MyChart".  Sign up information is provided on this After Visit Summary.  MyChart is used to connect with patients for Virtual Visits (Telemedicine).  Patients are able to view lab/test results, encounter notes, upcoming appointments, etc.  Non-urgent messages can be sent to your provider as well.   To learn more about what you can do with MyChart, go to [email or web address].    Your next appointment:   2-3 month(s)  The format for your next appointment:   In  Person  Provider:   Shelva Majestic, MD
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
## 2020-07-25 HISTORY — PX: TRANSTHORACIC ECHOCARDIOGRAM: SHX275

## 2020-07-25 HISTORY — PX: NM MYOVIEW LTD: HXRAD82

## 2020-07-26 ENCOUNTER — Telehealth (HOSPITAL_COMMUNITY): Payer: Self-pay | Admitting: *Deleted

## 2020-07-26 NOTE — Telephone Encounter (Signed)
Close encounter 

## 2020-07-27 ENCOUNTER — Other Ambulatory Visit: Payer: Self-pay

## 2020-07-27 ENCOUNTER — Ambulatory Visit: Payer: Medicare HMO | Admitting: Cardiovascular Disease

## 2020-07-27 ENCOUNTER — Ambulatory Visit (HOSPITAL_COMMUNITY)
Admission: RE | Admit: 2020-07-27 | Discharge: 2020-07-27 | Disposition: A | Discharge status: Home / Self Care | Payer: Medicare HMO | Source: Ambulatory Visit | Attending: Cardiovascular Disease | Admitting: Cardiovascular Disease

## 2020-07-27 DIAGNOSIS — I25810 Atherosclerosis of coronary artery bypass graft(s) without angina pectoris: Secondary | ICD-10-CM | POA: Insufficient documentation

## 2020-07-27 LAB — MYOCARDIAL PERFUSION IMAGING
LV dias vol: 101 mL (ref 62–150)
LV sys vol: 44 mL
Peak HR: 78 {beats}/min
Rest HR: 51 {beats}/min
SDS: 1
SRS: 0
SSS: 1
TID: 1.03

## 2020-07-27 MED ORDER — REGADENOSON 0.4 MG/5ML IV SOLN
0.4000 mg | Freq: Once | INTRAVENOUS | Status: AC
  Administered 2020-07-27: 0.4 mg via INTRAVENOUS

## 2020-07-27 MED ORDER — TECHNETIUM TC 99M TETROFOSMIN IV KIT
30.8000 | PACK | Freq: Once | INTRAVENOUS | Status: AC | PRN
  Administered 2020-07-27: 30.8 via INTRAVENOUS
  Filled 2020-07-27: qty 31

## 2020-07-27 MED ORDER — TECHNETIUM TC 99M TETROFOSMIN IV KIT
10.2000 | PACK | Freq: Once | INTRAVENOUS | Status: AC | PRN
  Administered 2020-07-27: 10.2 via INTRAVENOUS
  Filled 2020-07-27: qty 11

## 2020-07-31 ENCOUNTER — Encounter: Payer: Self-pay | Admitting: Cardiovascular Disease

## 2020-08-16 ENCOUNTER — Ambulatory Visit (HOSPITAL_COMMUNITY): Payer: Medicare HMO | Attending: Internal Medicine

## 2020-08-16 ENCOUNTER — Other Ambulatory Visit: Payer: Self-pay

## 2020-08-16 DIAGNOSIS — I25810 Atherosclerosis of coronary artery bypass graft(s) without angina pectoris: Secondary | ICD-10-CM | POA: Diagnosis not present

## 2020-08-16 LAB — ECHOCARDIOGRAM COMPLETE
Area-P 1/2: 3.23 cm2
S' Lateral: 2.1 cm

## 2020-08-24 ENCOUNTER — Ambulatory Visit: Payer: Medicare HMO | Admitting: Neurology

## 2020-09-04 ENCOUNTER — Other Ambulatory Visit: Payer: Self-pay | Admitting: Physician Assistant

## 2020-10-30 ENCOUNTER — Ambulatory Visit: Payer: Medicare HMO | Admitting: Cardiovascular Disease

## 2020-10-30 ENCOUNTER — Other Ambulatory Visit: Payer: Self-pay

## 2020-10-30 ENCOUNTER — Encounter: Payer: Self-pay | Admitting: Cardiovascular Disease

## 2020-10-30 VITALS — BP 138/72 | HR 68 | Ht 67.0 in | Wt 196.0 lb

## 2020-10-30 DIAGNOSIS — R7303 Prediabetes: Secondary | ICD-10-CM | POA: Diagnosis not present

## 2020-10-30 DIAGNOSIS — E785 Hyperlipidemia, unspecified: Secondary | ICD-10-CM | POA: Diagnosis not present

## 2020-10-30 DIAGNOSIS — I469 Cardiac arrest, cause unspecified: Secondary | ICD-10-CM | POA: Diagnosis not present

## 2020-10-30 DIAGNOSIS — I2510 Atherosclerotic heart disease of native coronary artery without angina pectoris: Secondary | ICD-10-CM

## 2020-10-30 DIAGNOSIS — I10 Essential (primary) hypertension: Secondary | ICD-10-CM | POA: Diagnosis not present

## 2020-10-30 DIAGNOSIS — Z951 Presence of aortocoronary bypass graft: Secondary | ICD-10-CM | POA: Diagnosis not present

## 2020-10-30 MED ORDER — METOPROLOL SUCCINATE ER 50 MG PO TB24: 50.0000 mg | ORAL_TABLET | Freq: Every day | ORAL | 3 refills | Status: AC
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
## 2020-10-30 NOTE — Patient Instructions (Addendum)
Medication Instructions:  DISCONTINUE metoprolol tartrate 25mg  twice daily when you finish your medication, per Dr. Claiborne Billings.  BEGIN metoprolol succinate 50mg  once daily once you are finished with the metoprolol tartrate.   *If you need a refill on your cardiac medications before your next appointment, please call your pharmacy*   Lab Work: None ordered.    Testing/Procedures: None ordered.    Follow-Up: At [facility], you and your health needs are our priority.  As part of our continuing mission to provide you with exceptional heart care, we have created designated Provider Care Teams.  These Care Teams include your primary Cardiologist (physician) and Advanced Practice Providers (APPs -  Physician Assistants and Nurse Practitioners) who all work together to provide you with the care you need, when you need it.  We recommend signing up for the patient portal called "MyChart".  Sign up information is provided on this After Visit Summary.  MyChart is used to connect with patients for Virtual Visits (Telemedicine).  Patients are able to view lab/test results, encounter notes, upcoming appointments, etc.  Non-urgent messages can be sent to your provider as well.   To learn more about what you can do with MyChart, go to [email or web address].    Your next appointment:   12 months  The format for your next appointment:   In Person  Provider:   Shelva Majestic, MD

## 2020-10-31 ENCOUNTER — Encounter: Payer: Self-pay | Admitting: Cardiovascular Disease

## 2020-12-05 DIAGNOSIS — Z20822 Contact with and (suspected) exposure to covid-19: Secondary | ICD-10-CM | POA: Diagnosis not present

## 2021-04-10 DIAGNOSIS — I119 Hypertensive heart disease without heart failure: Secondary | ICD-10-CM | POA: Diagnosis not present

## 2021-04-10 DIAGNOSIS — I10 Essential (primary) hypertension: Secondary | ICD-10-CM | POA: Diagnosis not present

## 2021-04-10 DIAGNOSIS — I69359 Hemiplegia and hemiparesis following cerebral infarction affecting unspecified side: Secondary | ICD-10-CM | POA: Diagnosis not present

## 2021-04-10 DIAGNOSIS — E119 Type 2 diabetes mellitus without complications: Secondary | ICD-10-CM | POA: Diagnosis not present

## 2021-04-10 DIAGNOSIS — E663 Overweight: Secondary | ICD-10-CM | POA: Diagnosis not present

## 2021-04-10 DIAGNOSIS — I259 Chronic ischemic heart disease, unspecified: Secondary | ICD-10-CM | POA: Diagnosis not present

## 2021-04-10 DIAGNOSIS — L821 Other seborrheic keratosis: Secondary | ICD-10-CM | POA: Diagnosis not present

## 2021-04-10 DIAGNOSIS — E538 Deficiency of other specified B group vitamins: Secondary | ICD-10-CM | POA: Diagnosis not present

## 2021-04-10 DIAGNOSIS — Z79899 Other long term (current) drug therapy: Secondary | ICD-10-CM | POA: Diagnosis not present

## 2021-04-10 DIAGNOSIS — Z1159 Encounter for screening for other viral diseases: Secondary | ICD-10-CM | POA: Diagnosis not present

## 2021-04-12 DIAGNOSIS — I259 Chronic ischemic heart disease, unspecified: Secondary | ICD-10-CM | POA: Diagnosis not present

## 2021-04-12 DIAGNOSIS — I10 Essential (primary) hypertension: Secondary | ICD-10-CM | POA: Diagnosis not present

## 2021-04-21 DIAGNOSIS — Z0100 Encounter for examination of eyes and vision without abnormal findings: Secondary | ICD-10-CM | POA: Diagnosis not present

## 2021-05-03 DIAGNOSIS — G4730 Sleep apnea, unspecified: Secondary | ICD-10-CM | POA: Diagnosis not present

## 2021-05-03 DIAGNOSIS — E785 Hyperlipidemia, unspecified: Secondary | ICD-10-CM | POA: Diagnosis not present

## 2021-05-03 DIAGNOSIS — Z0001 Encounter for general adult medical examination with abnormal findings: Secondary | ICD-10-CM | POA: Diagnosis not present

## 2021-05-03 DIAGNOSIS — I119 Hypertensive heart disease without heart failure: Secondary | ICD-10-CM | POA: Diagnosis not present

## 2021-05-03 DIAGNOSIS — H11003 Unspecified pterygium of eye, bilateral: Secondary | ICD-10-CM | POA: Diagnosis not present

## 2021-05-03 DIAGNOSIS — E663 Overweight: Secondary | ICD-10-CM | POA: Diagnosis not present

## 2021-05-03 DIAGNOSIS — I259 Chronic ischemic heart disease, unspecified: Secondary | ICD-10-CM | POA: Diagnosis not present

## 2021-05-03 DIAGNOSIS — I69359 Hemiplegia and hemiparesis following cerebral infarction affecting unspecified side: Secondary | ICD-10-CM | POA: Diagnosis not present

## 2021-05-03 DIAGNOSIS — E119 Type 2 diabetes mellitus without complications: Secondary | ICD-10-CM | POA: Diagnosis not present

## 2021-05-03 DIAGNOSIS — D492 Neoplasm of unspecified behavior of bone, soft tissue, and skin: Secondary | ICD-10-CM | POA: Diagnosis not present

## 2021-07-03 ENCOUNTER — Other Ambulatory Visit (HOSPITAL_BASED_OUTPATIENT_CLINIC_OR_DEPARTMENT_OTHER): Payer: Self-pay

## 2021-07-03 DIAGNOSIS — R0683 Snoring: Secondary | ICD-10-CM

## 2021-07-03 DIAGNOSIS — R0681 Apnea, not elsewhere classified: Secondary | ICD-10-CM

## 2021-07-25 ENCOUNTER — Other Ambulatory Visit: Payer: Self-pay

## 2021-07-25 ENCOUNTER — Ambulatory Visit (HOSPITAL_BASED_OUTPATIENT_CLINIC_OR_DEPARTMENT_OTHER): Payer: Medicare HMO | Attending: Family Medicine | Admitting: Internal Medicine

## 2021-07-25 VITALS — Ht 68.0 in | Wt 189.0 lb

## 2021-07-25 DIAGNOSIS — G4733 Obstructive sleep apnea (adult) (pediatric): Secondary | ICD-10-CM | POA: Insufficient documentation

## 2021-07-25 DIAGNOSIS — R0681 Apnea, not elsewhere classified: Secondary | ICD-10-CM

## 2021-07-25 DIAGNOSIS — R0683 Snoring: Secondary | ICD-10-CM

## 2021-07-29 DIAGNOSIS — G4733 Obstructive sleep apnea (adult) (pediatric): Secondary | ICD-10-CM

## 2021-07-29 DIAGNOSIS — R0683 Snoring: Secondary | ICD-10-CM

## 2021-07-29 NOTE — Procedures (Signed)
? ? ?  Patient Name: Daniel Holland, Daniel Holland ?Study Date: 07/25/2021 ?Gender: Male ?D.O.B: 03/03/1951 ?Age (years): 5 ?Referring Provider: Loura Pardon MD ?Height (inches): 68 ?Interpreting Physician: Baird Lyons MD, ABSM ?Weight (lbs): 189 ?RPSGT: Jacolyn Reedy ?BMI: 29 ?MRN: [id number] ? ?CLINICAL INFORMATION ?Sleep Study Type: HST ?Indication for sleep study: OSA, Snoring ?Epworth Sleepiness Score: 4 ? ?SLEEP STUDY TECHNIQUE ?A multi-channel overnight portable sleep study was performed. The channels recorded were: nasal airflow, thoracic respiratory movement, and oxygen saturation with a pulse oximetry. Snoring was also monitored. ? ?MEDICATIONS ?Patient self administered medications include: none reported. ? ?SLEEP ARCHITECTURE ?Patient was studied for 383.7 minutes. The sleep efficiency was 100.0 % and the patient was supine for 96.2%. The arousal index was 0.0 per hour. ? ?RESPIRATORY PARAMETERS ?The overall AHI was 56.9 per hour, with a central apnea index of 0 per hour. ?The oxygen nadir was 63% during sleep. ? ?CARDIAC DATA ?Mean heart rate during sleep was 75.2 bpm. ? ?IMPRESSIONS ?- Severe obstructive sleep apnea occurred during this study (AHI = 56.9/h). ?- Oxygen desaturation was noted during this study (Min O2 = 63%). Mean 92% ? ?- Patient snored. ?DIAGNOSIS ?- Obstructive Sleep Apnea (G47.33) ? ?RECOMMENDATIONS ?- Suggest CPAP titration sleep study or autopap. Other options would be based on clinical judgment. ?- Be careful with alcohol, sedatives and other CNS depressants that may worsen sleep apnea and disrupt normal sleep architecture. ?- Sleep hygiene should be reviewed to assess factors that may improve sleep quality. ?- Weight management and regular exercise should be initiated or continued. ? ?[Electronically signed] 07/29/2021 11:43 AM ? ?Baird Lyons MD, ABSM ?Diplomate, [place] Board of Sleep Medicine ? ? ?NPI: [id number] ? ?  ? ? ? ? ? ? ? ? ? ? ? ? ? ? ? ? ? ? ? ? ? ?[name] ?Diplomate, Tax adviser of Sleep Medicine ? ?ELECTRONICALLY SIGNED ON:  07/29/2021, 11:40 AM ?[place] ?PH: [phone]   FX: [phone] ?ACCREDITED BY THE AMERICAN ACADEMY OF SLEEP MEDICINE ?

## 2021-12-19 ENCOUNTER — Ambulatory Visit: Payer: Medicare HMO | Admitting: Cardiovascular Disease

## 2021-12-19 ENCOUNTER — Encounter: Payer: Self-pay | Admitting: Cardiovascular Disease

## 2021-12-19 VITALS — BP 140/78 | HR 55 | Ht 67.0 in | Wt 187.0 lb

## 2021-12-19 DIAGNOSIS — G4733 Obstructive sleep apnea (adult) (pediatric): Secondary | ICD-10-CM

## 2021-12-19 DIAGNOSIS — Z951 Presence of aortocoronary bypass graft: Secondary | ICD-10-CM | POA: Diagnosis not present

## 2021-12-19 DIAGNOSIS — I469 Cardiac arrest, cause unspecified: Secondary | ICD-10-CM

## 2021-12-19 DIAGNOSIS — I10 Essential (primary) hypertension: Secondary | ICD-10-CM

## 2021-12-19 DIAGNOSIS — R7303 Prediabetes: Secondary | ICD-10-CM

## 2021-12-19 DIAGNOSIS — I2510 Atherosclerotic heart disease of native coronary artery without angina pectoris: Secondary | ICD-10-CM | POA: Diagnosis not present

## 2021-12-19 DIAGNOSIS — E785 Hyperlipidemia, unspecified: Secondary | ICD-10-CM

## 2021-12-19 MED ORDER — AMLODIPINE BESYLATE 5 MG PO TABS: 7.5000 mg | ORAL_TABLET | Freq: Every day | ORAL | 3 refills | Status: AC

## 2021-12-19 NOTE — Patient Instructions (Signed)
Medication Instructions:  Increase Amlodipine to 7.5 mg (1.5 tablets) daily.  *If you need a refill on your cardiac medications before your next appointment, please call your pharmacy*   Follow-Up: At [facility], you and your health needs are our priority.  As part of our continuing mission to provide you with exceptional heart care, we have created designated Provider Care Teams.  These Care Teams include your primary Cardiologist (physician) and Advanced Practice Providers (APPs -  Physician Assistants and Nurse Practitioners) who all work together to provide you with the care you need, when you need it.  We recommend signing up for the patient portal called "MyChart".  Sign up information is provided on this After Visit Summary.  MyChart is used to connect with patients for Virtual Visits (Telemedicine).  Patients are able to view lab/test results, encounter notes, upcoming appointments, etc.  Non-urgent messages can be sent to your provider as well.   To learn more about what you can do with MyChart, go to [email or web address].    Your next appointment:   6 month(s)  The format for your next appointment:   In Person  Provider:   Shelva Majestic, MD
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
## 2021-12-25 ENCOUNTER — Encounter: Payer: Self-pay | Admitting: Cardiovascular Disease

## 2022-01-09 ENCOUNTER — Telehealth: Payer: Self-pay

## 2022-01-09 NOTE — Telephone Encounter (Signed)
Patient had labs drawn from [facility]-  Highlights below:  *on atorvastatin 80 mg*  Cholesterol- 143 HDL- 41 Trigs- 92 LDL- 83   Will scan into chart.

## 2022-01-22 NOTE — Telephone Encounter (Signed)
ok 

## 2022-06-21 ENCOUNTER — Other Ambulatory Visit (HOSPITAL_BASED_OUTPATIENT_CLINIC_OR_DEPARTMENT_OTHER): Payer: Self-pay

## 2022-06-21 DIAGNOSIS — R0683 Snoring: Secondary | ICD-10-CM

## 2022-06-21 DIAGNOSIS — R0681 Apnea, not elsewhere classified: Secondary | ICD-10-CM

## 2022-06-21 DIAGNOSIS — G4733 Obstructive sleep apnea (adult) (pediatric): Secondary | ICD-10-CM

## 2022-08-16 LAB — LAB REPORT - SCANNED
Albumin, Urine POC: 0.8
Creatinine, POC: 51 mg/dL
EGFR: 98
PSA, Total: 0.87

## 2022-09-01 ENCOUNTER — Encounter (HOSPITAL_BASED_OUTPATIENT_CLINIC_OR_DEPARTMENT_OTHER): Payer: Medicare HMO | Admitting: Internal Medicine

## 2022-09-15 ENCOUNTER — Ambulatory Visit (HOSPITAL_BASED_OUTPATIENT_CLINIC_OR_DEPARTMENT_OTHER): Payer: Medicare HMO | Attending: Family Medicine | Admitting: Internal Medicine

## 2022-09-15 VITALS — Ht 68.0 in | Wt 184.0 lb

## 2022-09-15 DIAGNOSIS — G4733 Obstructive sleep apnea (adult) (pediatric): Secondary | ICD-10-CM | POA: Diagnosis not present

## 2022-09-15 DIAGNOSIS — R0683 Snoring: Secondary | ICD-10-CM

## 2022-09-15 DIAGNOSIS — R0681 Apnea, not elsewhere classified: Secondary | ICD-10-CM

## 2022-09-21 DIAGNOSIS — G4733 Obstructive sleep apnea (adult) (pediatric): Secondary | ICD-10-CM | POA: Diagnosis not present

## 2022-09-21 NOTE — Procedures (Signed)
Patient Name: Daniel Holland, Daniel Holland Date: 09/15/2022 Gender: Male D.O.B: Nov 19, 1950 Age (years): 19 Referring Provider: Sharmon Revere MD Height (inches): 68 Interpreting Physician: Jetty Duhamel MD, ABSM Weight (lbs): 184 RPSGT: Armen Pickup BMI: 28 MRN: [id number] Neck Size: 15.00  CLINICAL INFORMATION The patient is referred for a BiPAP titration to treat sleep apnea.  Date of NPSG, Split Night or HST:  HST  07/25/21  AHI 56.9/ hr, desaturation to 63%, Body weight 189 lbs  SLEEP STUDY TECHNIQUE As per the AASM Manual for the Scoring of Sleep and Associated Events v2.3 (April 2016) with a hypopnea requiring 4% desaturations.  The channels recorded and monitored were frontal, central and occipital EEG, electrooculogram (EOG), submentalis EMG (chin), nasal and oral airflow, thoracic and abdominal wall motion, anterior tibialis EMG, snore microphone, electrocardiogram, and pulse oximetry. Bilevel positive airway pressure (BPAP) was initiated at the beginning of the study and titrated to treat sleep-disordered breathing.  MEDICATIONS Medications self-administered by patient taken the night of the study : none reported  RESPIRATORY PARAMETERS Optimal IPAP Pressure (cm): 18 AHI at Optimal Pressure (/hr) 0 Optimal EPAP Pressure (cm): 14   Overall Minimal O2 (%): 83.0 Minimal O2 at Optimal Pressure (%): 86.0 SLEEP ARCHITECTURE Start Time: 9:35:50 PM Stop Time: 3:52:54 AM Total Time (min): 377.1 Total Sleep Time (min): 278.5 Sleep Latency (min): 32.7 Sleep Efficiency (%): 73.9% REM Latency (min): 50.5 WASO (min): 65.9 Stage N1 (%): 5.2% Stage N2 (%): 78.8% Stage N3 (%): 0.0% Stage R (%): 16 Supine (%): 35.71 Arousal Index (/hr): 28.7   CARDIAC DATA The 2 lead EKG demonstrated sinus rhythm. The mean heart rate was 46.1 beats per minute. Other EKG findings include: None.  LEG MOVEMENT DATA The total Periodic Limb Movements of Sleep (PLMS) were 0. The PLMS index was 0.0. A PLMS  index of <15 is considered normal in adults.  IMPRESSIONS - CPAP did not provide adequate control at tolerated pressure and titration was changed to BIPAP. - An optimal BIPAP pressure was selected for this patient ( 18 / 14 cm of water) - Mild Central Sleep Apnea was noted during this titration (CAI = 6.7/h). - Moderate oxygen desaturations were observed during this titration (min O2 = 83.0%). On BIAP 18/14, minimum O2 saturation was 86%, mean 90.3%. - No snoring was audible during this study. - No cardiac abnormalities were observed during this study. - Clinically significant periodic limb movements were not noted during this study. Arousals associated with PLMs were rare.  DIAGNOSIS - Obstructive Sleep Apnea (G47.33)  RECOMMENDATIONS - Because Central Apneas were also noted, suggest Trial of BiPAP ST therapy on 18/14 cm H2O with back up respiratory rate 10/ minute. - Patient wore a Small-Medium size [employer] Full Face Evora Full mask and heated humidification. - Be careful with alcohol, sedatives and other CNS depressants that may worsen sleep apnea and disrupt normal sleep architecture. - Sleep hygiene should be reviewed to assess factors that may improve sleep quality. - Weight management and regular exercise should be initiated or continued.  [Electronically signed] 09/21/2022 12:48 PM  Jetty Duhamel MD, ABSM Diplomate, American Board of Sleep Medicine NPI: [id number]                         Jetty Duhamel Diplomate, American Board of Sleep Medicine  ELECTRONICALLY SIGNED ON:  09/21/2022, 12:41 PM [facility] PH: [phone]   FX: [phone] ACCREDITED BY THE AMERICAN ACADEMY OF SLEEP MEDICINE

## 2022-09-22 LAB — LAB REPORT - SCANNED
Albumin, Urine POC: 2.8
Creatinine, POC: 147 mg/dL
EGFR: 91

## 2022-09-30 ENCOUNTER — Telehealth: Payer: Self-pay | Admitting: Cardiovascular Disease

## 2022-09-30 NOTE — Telephone Encounter (Signed)
Paper Work Dropped Off: [place] [employer] of Transportation     Date: 09/30/2022  Location of paper:  Provider Mailbox

## 2022-10-07 ENCOUNTER — Telehealth: Payer: Self-pay | Admitting: Cardiovascular Disease

## 2022-10-07 NOTE — Telephone Encounter (Signed)
Patient came into office regarding paperwork that was dropped off on 09/30/22. Spoke with provider nurse, patient was informed provider has not been in office since pt dropped off paperwork, once paperwork is completed our office would give patient a call.

## 2022-10-14 NOTE — Telephone Encounter (Signed)
Pt came into office inquiring about paperwork (transportation related). Has not received any info & stating he's receiving letters from the [facility] about suspending license - needs paperwork ASAP.  10-14-22, J.Britt

## 2022-10-16 NOTE — Telephone Encounter (Signed)
Patient paperwork given to front desk, ready to pick up.  Sent mychart message advising of this.  Thanks!

## 2022-10-22 NOTE — Progress Notes (Unsigned)
Cardiology Clinic Note   Date: 10/24/2022 ID: Daniel Holland, DOB Jun 26, 1950, MRN [id number]  Primary Cardiologist:  Nicki Guadalajara, MD  Patient Profile    Daniel Holland is a 72 y.o. male who presents to the clinic today for routine follow up.   Past medical history significant for: CAD. LHC 07/06/2018 (PEA arrest): Proximal RCA 90%.  Mid RCA 95%.  Ostial to mid LM 60%.  Ostial to proximal LAD 90%.  OM1 90%.  CTS consult. CABG x 4 07/09/2018: LIMA to LAD, SVG to OM1, SVG to distal LCx, SVG to RCA. Nuclear stress test 07/27/2020: Normal low risk study. Echo 08/16/2020: EF 50 to 55%.  RWMA.  Moderate LVH.  Grade I DD.  Moderate hypokinesis entire septal wall.  Normal RV function.  Trivial MR.  Borderline dilatation of aortic root 38 mm. Hypertension. Hyperlipidemia. T2DM. OSA.   History of Present Illness    Daniel Holland was first evaluated by Dr. Tresa Holland on 07/03/2018 following PEA arrest.  He underwent LHC which showed left main three-vessel disease.  It was felt PEA arrest was ischemically mediated.  He underwent CABG x 4.  He continues to be followed by Dr. Tresa Holland.  Patient was last seen in the office by Dr. Tresa Holland on 12/19/2021 for routine follow-up.  He he reported mild increase in BP.  Amlodipine was increased.  Today, patient is doing well. Patient denies shortness of breath or dyspnea on exertion. No chest pain, pressure, or tightness. Denies lower extremity edema, orthopnea, or PND. No palpitations.  He continues to work at the [employer] and gets between [employer] steps a day. He does not do formal exercise. He follows a healthy low sodium, low sugar diet. He recently had a sleep study but has not heard anything since he did the in-lab study. He is instructed to contact his PCP for next steps.    ROS: All other systems reviewed and are otherwise negative except as noted in History of Present Illness.  Studies Reviewed    ECG personally reviewed by me today: NSR 58  bpm.  No significant changes from 12/20/2022.       Physical Exam    VS:  BP (!) 146/72   Pulse (!) 58   Ht 5' 7.75" (1.721 m)   Wt 182 lb (82.6 kg)   SpO2 94%   BMI 27.88 kg/m  , BMI Body mass index is 27.88 kg/m.  GEN: Well nourished, well developed, in no acute distress. Neck: No JVD or carotid bruits. Cardiac:  RRR. No murmurs. No rubs or gallops.   Respiratory:  Respirations regular and unlabored. Clear to auscultation without rales, wheezing or rhonchi. GI: Soft, nontender, nondistended. Extremities: Radials/DP/PT 2+ and equal bilaterally. No clubbing or cyanosis. No edema.  Skin: Warm and dry, no rash. Neuro: Strength intact.  Assessment & Plan    CAD.  S/p CABG x 30 June 2018 following PEA arrest.  Nuclear stress test March 2022 was as normal low risk study.  Patient denies chest pain, tightness, pressure. He gets 6000-12,000 steps a day at work. He follows a healthy low sodium, low sugar diet.  Continue atorvastatin, metoprolol. Hypertension. BP today 146/76 at intake and 128/72 on recheck. Patient denies headaches, dizziness or vision changes. Continue amlodipine, metoprolol. Hyperlipidemia. Cholesterol is checked by PCP. He will bring in most recent results in the next couple of weeks to be scanned into his chart. Continue atorvastatin.   Disposition: Return in 1 year or sooner as needed.  Signed, Etta Grandchild. Kalliopi Coupland, DNP, NP-C

## 2022-10-24 ENCOUNTER — Ambulatory Visit: Payer: Medicare HMO | Attending: Student | Admitting: Student

## 2022-10-24 ENCOUNTER — Encounter: Payer: Self-pay | Admitting: Student

## 2022-10-24 VITALS — BP 128/72 | HR 58 | Ht 67.75 in | Wt 182.0 lb

## 2022-10-24 DIAGNOSIS — Z951 Presence of aortocoronary bypass graft: Secondary | ICD-10-CM | POA: Diagnosis not present

## 2022-10-24 DIAGNOSIS — E785 Hyperlipidemia, unspecified: Secondary | ICD-10-CM

## 2022-10-24 DIAGNOSIS — I2510 Atherosclerotic heart disease of native coronary artery without angina pectoris: Secondary | ICD-10-CM | POA: Diagnosis not present

## 2022-10-24 DIAGNOSIS — I10 Essential (primary) hypertension: Secondary | ICD-10-CM

## 2022-10-24 NOTE — Patient Instructions (Signed)
Other Instructions    Please contact  Dr Delia Chimes  office to inform them you have not receive any information since sleep study.  . What is the next step?  Medication Instructions:   No changes   *If you need a refill on your cardiac medications before your next appointment, please call your pharmacy*   Lab Work:  Not needed   Testing/Procedures: Not needed   Follow-Up: At [facility], you and your health needs are our priority.  As part of our continuing mission to provide you with exceptional heart care, we have created designated Provider Care Teams.  These Care Teams include your primary Cardiologist (physician) and Advanced Practice Providers (APPs -  Physician Assistants and Nurse Practitioners) who all work together to provide you with the care you need, when you need it.     Your next appointment:   12 month(s)  The format for your next appointment:   In Person  Provider:   Nicki Guadalajara, MD    Other Instructions    Please contact  Dr Delia Chimes  office to inform them you have not receive any information since sleep study.  . What is the next step?

## 2022-11-12 ENCOUNTER — Encounter: Payer: Self-pay | Admitting: Family Medicine

## 2023-03-25 LAB — LAB REPORT - SCANNED: EGFR: 92

## 2023-03-26 ENCOUNTER — Encounter: Payer: Self-pay | Admitting: Cardiovascular Disease

## 2023-10-02 ENCOUNTER — Ambulatory Visit: Payer: Medicare HMO | Admitting: Cardiovascular Disease

## 2023-11-04 ENCOUNTER — Ambulatory Visit: Admitting: Cardiovascular Disease

## 2023-11-04 ENCOUNTER — Encounter: Payer: Self-pay | Admitting: Cardiovascular Disease

## 2023-11-04 VITALS — BP 144/94 | HR 56 | Ht 67.75 in | Wt 195.4 lb

## 2023-11-04 NOTE — Progress Notes (Signed)
 Order(s) created erroneously. Erroneous order ID: [id number]  Order moved by: CHART CORRECTION ANALYST SEVEN, IDENTITY  Order move date/time: 11/04/2023 5:48 PM  Source Patient: [id number]  Source Contact: 11/04/2023  Destination Patient: [id number]  Destination Contact: 11/06/2022

## 2023-11-05 ENCOUNTER — Encounter: Payer: Self-pay | Admitting: Cardiovascular Disease

## 2023-11-05 LAB — LAB REPORT - SCANNED
Albumin, Urine POC: 3.8
Creatinine, POC: 132 mg/dL
EGFR: 87
Microalb Creat Ratio: 29
TSH: 1.26 (ref 0.41–5.90)

## 2023-11-17 ENCOUNTER — Ambulatory Visit: Payer: Self-pay | Admitting: Cardiovascular Disease

## 2023-12-24 ENCOUNTER — Encounter: Payer: Self-pay | Admitting: Cardiology

## 2023-12-24 ENCOUNTER — Ambulatory Visit: Attending: Cardiology | Admitting: Cardiology

## 2023-12-24 VITALS — BP 136/78 | HR 57 | Ht 67.75 in | Wt 192.0 lb

## 2023-12-24 DIAGNOSIS — I2510 Atherosclerotic heart disease of native coronary artery without angina pectoris: Secondary | ICD-10-CM | POA: Diagnosis not present

## 2023-12-24 DIAGNOSIS — I10 Essential (primary) hypertension: Secondary | ICD-10-CM

## 2023-12-24 DIAGNOSIS — E785 Hyperlipidemia, unspecified: Secondary | ICD-10-CM

## 2023-12-24 DIAGNOSIS — I214 Non-ST elevation (NSTEMI) myocardial infarction: Secondary | ICD-10-CM | POA: Diagnosis not present

## 2023-12-24 DIAGNOSIS — I469 Cardiac arrest, cause unspecified: Secondary | ICD-10-CM

## 2023-12-24 DIAGNOSIS — Z951 Presence of aortocoronary bypass graft: Secondary | ICD-10-CM

## 2023-12-24 DIAGNOSIS — E1169 Type 2 diabetes mellitus with other specified complication: Secondary | ICD-10-CM

## 2023-12-24 DIAGNOSIS — Z8673 Personal history of transient ischemic attack (TIA), and cerebral infarction without residual deficits: Secondary | ICD-10-CM

## 2023-12-24 MED ORDER — NEXLIZET 180-10 MG PO TABS: 1.0000 | ORAL_TABLET | Freq: Every day | ORAL | 3 refills | Status: DC

## 2023-12-24 NOTE — Patient Instructions (Signed)
 Medication Instructions:   Start Nexlizet  180/10 mg one tablet daily  *If you need a refill on your cardiac medications before your next appointment, please call your pharmacy*   Lab Work: Not needed    Testing/Procedures:  Not needed  Follow-Up: At [facility], you and your health needs are our priority.  As part of our continuing mission to provide you with exceptional heart care, we have created designated Provider Care Teams.  These Care Teams include your primary Cardiologist (physician) and Advanced Practice Providers (APPs -  Physician Assistants and Nurse Practitioners) who all work together to provide you with the care you need, when you need it.     Your next appointment:   12 month(s)  The format for your next appointment:   In Person  Provider:   Alm Clay, MD   Other Instructions

## 2023-12-24 NOTE — Progress Notes (Signed)
 Cardiology Office Note:  .   Date:  12/27/2023  ID:  Daniel Holland, DOB 05-26-1951, MRN [id number] PCP: Haze Kingfisher, MD  [facility] Providers Cardiologist:  Alm Clay, MD     Chief Complaint  Patient presents with   Follow-up    Delayed annual follow-up.  Previously followed by Dr. Burnard   Coronary Artery Disease    History of CABG for 3V CAD noted following PEA arrest.    Patient Profile: .     Daniel Holland is a relatively healthy 73 y.o. male with a PMH notable for HTN, HLD, DM and CVA preceding MI-PEA arrest who presents here for establishing new cardiologist care as a lady annual follow-up.  (He was originally supposed to be seen by Dr. Burnard on November 04, 2023, but he left before being seen due to the extended delay. He presents at the request of Paliwal, Himanshu, MD.  Notable PMH: CAD => initial presenting symptom was PEA arrest with elevated troponin LHC 07/06/2018:  3V CAD.  Proximal RCA 90%.  Mid RCA 95%.  Ostial to mid LM 60%.  Ostial to proximal LAD 90%.  OM1 90%.  CvTS consult. CABG x 4 07/09/2018: LIMA to LAD, SVG to OM1, SVG to distal LCx, SVG to RCA. Myoview3/07/2020: Normal low risk study. Echo 08/16/2020: EF 50 to 55%.  RWMA.  Moderate LVH.  Grade I DD.  Moderate hypokinesis entire septal wall.  Normal RV function.  Trivial MR.  Borderline dilatation of aortic root 38 mm. ?  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  Hypertension. Hyperlipidemia. T2DM. OSA.    Daniel Holland was last seen on Oct 24, 2022 by Barnie Hila, NP  (last seen by Dr. Burnard in July 2023).  [name] was doing well with no complaints.  No dyspnea at rest or exertion.  No chest pain or pressure with rest or exertion.  No PND orthopnea or edema.  Very active at work getting between 6000-12,000 steps a day but no formal exercise.  Apparently had a sleep study done but did not hear any result report.  No changes to meds  made.  Plan for follow-up in 1 year.  Subjective  Discussed the use of AI scribe software for clinical note transcription with the patient, who gave verbal consent to proceed.  History of Present Illness  Daniel Holland is a 73 year old male with coronary artery disease who presents for a follow-up visit.  He has a history of coronary artery disease and underwent a quadruple bypass surgery in February 2020 following a heart attack. He remains active, walking 6,000 to 12,000 steps daily, and continues to work. No chest pain, pressure, tightness, heart racing, or leg swelling.  He describes a burning sensation and occasional soreness in his upper thighs but denies muscle aching when walking.  He has diabetes with a recent blood sugar level of 215 mg/dL and an J8r around 6.1 or 6.2. He takes glimepiride 1 mg daily for diabetes management.  His recent cholesterol levels showed an LDL of 82 mg/dL. He takes atorvastatin  80 mg daily and has made dietary changes, including eating more red meat and avoiding sweets.  His PCP increase his amlodipine  to 10 mg daily from 7.5 mg recently.  He denies smoking and reports never having smoked. He takes several medications, including atorvastatin  80 mg, glimepiride 1 mg, amlodipine  10 mg, and aspirin , with atorvastatin  and aspirin  taken at night,  and the other medications in the morning.  Cardiovascular ROS: no chest pain or dyspnea on exertion positive for - occasional burning sensation in his upper thighs but not with exertion. negative for - edema, irregular heartbeat, orthopnea, palpitations, paroxysmal nocturnal dyspnea, rapid heart rate, shortness of breath, or lightheadedness, dizziness or wooziness, syncope or near syncope, EVA/TIA or amaurosis fugax, claudication  ROS:  Review of Systems - Negative except symptoms of    Objective     Medications - Lipitor  80 mg - Aspirin  81 mg daily - Amlodipine  5 mg-2 tablets daily;-Toprol  50 mg  daily - Glimepiride 1 mg every morning   He works at [employer].  Studies Reviewed: SABRA   EKG Interpretation Date/Time:  Wednesday December 24 2023 11:16:35 EDT Ventricular Rate:  57 PR Interval:  166 QRS Duration:  92 QT Interval:  446 QTC Calculation: 434 R Axis:   76  Text Interpretation: Sinus bradycardia Nonspecific T wave abnormality When compared with ECG of 12-Apr-2014 11:06, Confirmed by Anner Lenis ([id number]) on 12/24/2023 11:47:22 AM     LABS Total cholesterol: 150 mg/dL (93/89/7974) Triglycerides: 154 mg/dL (93/89/7974) HDL: 42 mg/dL (93/89/7974) LDL: 82 mg/dL (93/89/7974) Glucose: 784 mg/dL (93/89/7974) Creatinine: 0.92 mg/dL (93/89/7974) BUN: 19 mg/dL (93/89/7974) Sodium: 861 mmol/L (11/04/2023) Potassium: 4.0 mmol/L (11/04/2023) Hemoglobin: 15.5 g/dL (93/89/7974) TSH: 8.73 IU/mL (11/04/2023) Hemoglobin A1c: 6.1-6.2% (10/2023)  DIAGNOSTIC Echocardiogram (07/01/2018): Ejection fraction 50-55%, no regional wall motion abnormalities, abnormal relaxation, benign appearance.  Echocardiogram (3/23 2022): LVEF 50-55%, moderate thickening, grade 1 diastolic dysfunction, septal dyssynergy suggestive of post CABG, valves normal. Myoview  Stress Test (07/2020): Ejection fraction 55%, low risk, no evidence of infarct or ischemia   Dominance: Right    Risk Assessment/Calculations:          Physical Exam:   VS:  BP 136/78   Pulse (!) 57   Ht 5' 7.75 (1.721 m)   Wt 192 lb (87.1 kg)   BMI 29.41 kg/m    Wt Readings from Last 3 Encounters:  12/24/23 192 lb (87.1 kg)  11/04/23 195 lb 6.4 oz (88.6 kg)  10/24/22 182 lb (82.6 kg)    GEN: Well nourished, well groomed in no acute distress; otherwise healthy appearing NECK: No JVD; No carotid bruits CARDIAC: Normal S1, S2; RRR, no murmurs, rubs, gallops RESPIRATORY:  Clear to auscultation without rales, wheezing or rhonchi ; nonlabored, good air movement. ABDOMEN: Soft, non-tender,  non-distended EXTREMITIES:  No edema; No deformity      ASSESSMENT AND PLAN: .    Problem List Items Addressed This Visit       Cardiology Problems   Cardiac arrest (HCC) (Chronic)   PEA arrest with ROSC and neurologic recovery => Cardiac Cath with multivessel CAD referred to CABG.  Preserved EF on echo with no evidence of ischemia or infarction on post CABG Myoview .      Relevant Medications   Bempedoic Acid-Ezetimibe (NEXLIZET ) 180-10 MG TABS   Coronary artery disease involving native coronary artery - Primary (Chronic)   Multivessel CAD noted on Cardiac Cath in setting of non-STEMI/PEA arrest => s/p CABG x 4 Reevaluation in March 2022 showed normal EF with normal wall motion and Myoview  with no ischemia or infarction.  No RWMA. He remains pretty active and it has been asymptomatic -denies chest pain or dyspnea at rest exertion, palpitations, or edema  - Continue atorvastatin  80 mg daily, along with Toprol -XL 50 mg daily, amlodipine  10 mg daily and aspirin  81 mg daily  - Add Nexlizet  for additional  lipid management - Report any changes such as chest pain or palpitations. - Annual follow-up recommended unless new issues arise. - Plan follow-up stress test in 2027 unless symptomatic      Relevant Medications   Bempedoic Acid-Ezetimibe (NEXLIZET ) 180-10 MG TABS   History of NSTEMI (non-ST elevated myocardial infarction) (HCC) (Chronic)   Initial presentation was PEA arrest leading to chronic catheterization showing multivessel CAD. Thankfully, Myoview  did not show any evidence of ischemia or infarction.  EF is preserved.      Relevant Medications   Bempedoic Acid-Ezetimibe (NEXLIZET ) 180-10 MG TABS   Other Relevant Orders   EKG 12-Lead (Completed)   Hyperlipidemia associated with type 2 diabetes mellitus (HCC) (Chronic)   LDL cholesterol 82 mg/dL, above target of <44 mg/dL. On high-dose atorvastatin . Discussed potential need for more potent medications if LDL goals are not met,  including injectable options. Nexlizet  reduces LDL production without muscle side effects. - Prescribe Nexlizet  to lower LDL cholesterol.  (Will need prior authorization submitted) - Monitor cholesterol levels with primary care provider at the end of the year. - Consider referral to Lipids Lab if LDL goals are not met with Nexlizet .  Most recent A1c was reportedly pretty well-controlled at 6.1-6.2 on glimepiride. If additional glycemic control desired, would probably consider SGLT2 inhibitor given his CAD      Relevant Medications   Bempedoic Acid-Ezetimibe (NEXLIZET ) 180-10 MG TABS   Primary hypertension   Borderline hypertensive you to [facility] for skin and recheck was better.  He remains on amlodipine  10 mg daily plus Toprol  50 mg daily.  Resting heart rate 57 therefore would not be able to titrate beta-blocker further. - If existing blood pressure continues to remain elevated would consider addition of an ARB with his history of CAD and DM-2      Relevant Medications   Bempedoic Acid-Ezetimibe (NEXLIZET ) 180-10 MG TABS     Other   History of CVA (cerebrovascular accident) (Chronic)   Remains on aspirin  monotherapy otherwise GDMT for CAD is adequate. 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      S/P CABG x 4 (Chronic)   CABG in 2020 with nonischemic Myoview  in 2022.  In the absence of symptoms, would hold off for stress test follow-up until 2027.             Follow-Up: Return in about 1 year (around 12/23/2024) for Routine follow up with me, [employer].  Total time spent: 25 min spent with patient (direct patient care discussing symptoms, history, diagnoses and assessment plan) + 23 min spent charting (5 minutes reviewing previous And echo/Myoview  results.  Cath films reviewed); 1 minute reviewing labs from PCP-he had his AVS; 6 minutes reviewing previous notes from Dr. Burnard and Barnie Hila, NP; 9 minutes dictating and documenting;;.  = 48  min    Signed, Alm MICAEL Clay, MD, MS Alm Clay, M.D., M.S. Interventional Chartered certified accountant  Pager # [phone]

## 2023-12-26 ENCOUNTER — Telehealth: Payer: Self-pay | Admitting: *Deleted

## 2023-12-26 NOTE — Telephone Encounter (Signed)
 Please route another request to pool once notes have been completed.

## 2023-12-26 NOTE — Telephone Encounter (Signed)
 I believe this patient needs a prior auth  for Nexlizet  180/10 .  Dr Anner saw him on 12/24/23.

## 2023-12-27 ENCOUNTER — Encounter: Payer: Self-pay | Admitting: Cardiology

## 2023-12-27 DIAGNOSIS — E1169 Type 2 diabetes mellitus with other specified complication: Secondary | ICD-10-CM | POA: Insufficient documentation

## 2023-12-27 DIAGNOSIS — I10 Essential (primary) hypertension: Secondary | ICD-10-CM | POA: Insufficient documentation

## 2023-12-27 NOTE — Assessment & Plan Note (Signed)
 Borderline hypertensive you to [facility] for skin and recheck was better.  He remains on amlodipine  10 mg daily plus Toprol  50 mg daily.  Resting heart rate 57 therefore would not be able to titrate beta-blocker further. - If existing blood pressure continues to remain elevated would consider addition of an ARB with his history of CAD and DM-2

## 2023-12-27 NOTE — Assessment & Plan Note (Addendum)
 Multivessel CAD noted on Cardiac Cath in setting of non-STEMI/PEA arrest => s/p CABG x 4 Reevaluation in March 2022 showed normal EF with normal wall motion and Myoview  with no ischemia or infarction.  No RWMA. He remains pretty active and it has been asymptomatic -denies chest pain or dyspnea at rest exertion, palpitations, or edema  - Continue atorvastatin  80 mg daily, along with Toprol -XL 50 mg daily, amlodipine  10 mg daily and aspirin  81 mg daily  - Add Nexlizet  for additional lipid management - Report any changes such as chest pain or palpitations. - Annual follow-up recommended unless new issues arise. - Plan follow-up stress test in 2027 unless symptomatic

## 2023-12-27 NOTE — Assessment & Plan Note (Signed)
 Initial presentation was PEA arrest leading to chronic catheterization showing multivessel CAD. Thankfully, Myoview  did not show any evidence of ischemia or infarction.  EF is preserved.

## 2023-12-27 NOTE — Assessment & Plan Note (Signed)
 PEA arrest with ROSC and neurologic recovery => Cardiac Cath with multivessel CAD referred to CABG.  Preserved EF on echo with no evidence of ischemia or infarction on post CABG Myoview .

## 2023-12-27 NOTE — Assessment & Plan Note (Signed)
 LDL cholesterol 82 mg/dL, above target of <44 mg/dL. On high-dose atorvastatin . Discussed potential need for more potent medications if LDL goals are not met, including injectable options. Nexlizet  reduces LDL production without muscle side effects. - Prescribe Nexlizet  to lower LDL cholesterol.  (Will need prior authorization submitted) - Monitor cholesterol levels with primary care provider at the end of the year. - Consider referral to Lipids Lab if LDL goals are not met with Nexlizet .  Most recent A1c was reportedly pretty well-controlled at 6.1-6.2 on glimepiride. If additional glycemic control desired, would probably consider SGLT2 inhibitor given his CAD

## 2023-12-27 NOTE — Assessment & Plan Note (Signed)
 CABG in 2020 with nonischemic Myoview  in 2022.  In the absence of symptoms, would hold off for stress test follow-up until 2027.

## 2023-12-27 NOTE — Assessment & Plan Note (Addendum)
 Remains on aspirin  monotherapy otherwise GDMT for CAD is adequate. 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

## 2023-12-29 NOTE — Telephone Encounter (Signed)
 This request is ready to be process-  12/24/23 visit has been dictated

## 2023-12-30 ENCOUNTER — Other Ambulatory Visit (HOSPITAL_COMMUNITY): Payer: Self-pay

## 2023-12-30 ENCOUNTER — Telehealth: Payer: Self-pay

## 2023-12-30 NOTE — Telephone Encounter (Signed)
 Pharmacy Patient Advocate Encounter  Received notification from [employer] that Prior Authorization for NEXLIZET  has been APPROVED from 12/30/23 to 07/01/24. Ran test claim, Copay is $295.35. This test claim was processed through [facility]- copay amounts may vary at other pharmacies due to pharmacy/plan contracts, or as the patient moves through the different stages of their insurance plan.

## 2023-12-30 NOTE — Telephone Encounter (Signed)
 Pharmacy Patient Advocate Encounter   Received notification from Physician's Office that prior authorization for NEXLIZET  is required/requested.   Insurance verification completed.   The patient is insured through [facility] .   Per test claim: PA required; PA submitted to above mentioned insurance via CoverMyMeds Key/confirmation #/EOC [id number] Status is pending

## 2023-12-30 NOTE — Telephone Encounter (Signed)
 PA request has been Submitted. New Encounter has been or will be created for follow up. For additional info see Pharmacy Prior Auth telephone encounter from 12/30/23.

## 2024-01-02 NOTE — Telephone Encounter (Signed)
 Spoke to patient -- patient states he was not able to afford the co pay for Nexlizet  .  Patient is aware will discuss with Dr Anner the alternative .

## 2024-01-02 NOTE — Telephone Encounter (Signed)
 Will start with trying just Zetia alone.

## 2024-01-06 MED ORDER — EZETIMIBE 10 MG PO TABS
10.0000 mg | ORAL_TABLET | Freq: Every day | ORAL | 3 refills | Status: AC
Start: 2024-01-06 — End: 2024-04-05

## 2024-01-06 NOTE — Telephone Encounter (Signed)
 Called left message . Plan is to start taking Zetia  10 mg daily  instead of Nexlizet . Since Nexlizet  is expensive co- pay.   Any question may call back.

## 2024-01-06 NOTE — Addendum Note (Signed)
 Addended by: GLADIS REENA GAILS on: 01/06/2024 09:34 AM   Modules accepted: Orders

## 2024-03-10 DIAGNOSIS — Z0279 Encounter for issue of other medical certificate: Secondary | ICD-10-CM

## 2024-03-11 ENCOUNTER — Telehealth: Payer: Self-pay | Admitting: Cardiology

## 2024-03-11 NOTE — Telephone Encounter (Signed)
 Patient brought in a [employer] for duty form.  He signed the Release of Information and paid the $29 fee.  Form is in Dr. Genice box.

## 2024-03-12 NOTE — Telephone Encounter (Signed)
 Called and left message form will be filled next week due to Dr Anner not in the office. A call back is not needed.

## 2024-03-17 NOTE — Telephone Encounter (Signed)
 Pt calling to f/u on paperwork. He wants to make sure they are ready for him to pickup today. Please advise.

## 2024-03-17 NOTE — Telephone Encounter (Signed)
 Patient came in and paid his $29 fee for the form completion.  Form scanned into chart and given to patient.  Billing notified.

## 2024-03-17 NOTE — Telephone Encounter (Signed)
 Pt made aware he can pick up this afternoon. Advised we filled out cardiovascular part and that he will need to take to his PCP to complete the rest. Patient verbalized understanding and agreeable to plan.

## 2024-03-26 ENCOUNTER — Telehealth: Payer: Self-pay | Admitting: Cardiology

## 2024-03-26 NOTE — Telephone Encounter (Signed)
 FMLA payment posted to pt's chart.  JB, 03-26-24
